# Patient Record
Sex: Female | Born: 1945 | Race: White | Hispanic: No | Smoking: Former smoker
Health system: Southern US, Community
[De-identification: ages and names within clinical notes are randomized; demographics above are authoritative.]

## PROBLEM LIST (undated history)

## (undated) DIAGNOSIS — C50919 Malignant neoplasm of unspecified site of unspecified female breast: Secondary | ICD-10-CM

## (undated) DIAGNOSIS — M1990 Unspecified osteoarthritis, unspecified site: Secondary | ICD-10-CM

## (undated) DIAGNOSIS — Z923 Personal history of irradiation: Secondary | ICD-10-CM

## (undated) DIAGNOSIS — I10 Essential (primary) hypertension: Secondary | ICD-10-CM

## (undated) DIAGNOSIS — Z9221 Personal history of antineoplastic chemotherapy: Secondary | ICD-10-CM

## (undated) DIAGNOSIS — E039 Hypothyroidism, unspecified: Secondary | ICD-10-CM

## (undated) DIAGNOSIS — I4891 Unspecified atrial fibrillation: Secondary | ICD-10-CM

## (undated) DIAGNOSIS — R0600 Dyspnea, unspecified: Secondary | ICD-10-CM

## (undated) HISTORY — PX: TONSILLECTOMY: SUR1361

## (undated) HISTORY — DX: Dyspnea, unspecified: R06.00

---
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
## 2003-03-05 DIAGNOSIS — C50919 Malignant neoplasm of unspecified site of unspecified female breast: Secondary | ICD-10-CM

## 2003-03-05 HISTORY — DX: Malignant neoplasm of unspecified site of unspecified female breast: C50.919

## 2003-03-05 HISTORY — PX: BREAST LUMPECTOMY: SHX2

## 2003-08-29 ENCOUNTER — Encounter: Admission: RE | Admit: 2003-08-29 | Discharge: 2003-08-29 | Payer: Self-pay

## 2003-08-29 ENCOUNTER — Encounter (INDEPENDENT_AMBULATORY_CARE_PROVIDER_SITE_OTHER): Payer: Self-pay | Admitting: Specialist

## 2003-08-29 HISTORY — PX: BREAST BIOPSY: SHX20

## 2003-09-06 ENCOUNTER — Encounter (HOSPITAL_COMMUNITY): Admission: RE | Admit: 2003-09-06 | Discharge: 2003-12-05 | Payer: Self-pay | Admitting: General Surgery

## 2003-09-09 ENCOUNTER — Encounter: Admission: RE | Admit: 2003-09-09 | Discharge: 2003-09-09 | Payer: Self-pay | Admitting: General Surgery

## 2003-09-12 ENCOUNTER — Ambulatory Visit (HOSPITAL_COMMUNITY): Admission: RE | Admit: 2003-09-12 | Discharge: 2003-09-12 | Payer: Self-pay | Admitting: General Surgery

## 2003-09-23 ENCOUNTER — Other Ambulatory Visit: Admission: RE | Admit: 2003-09-23 | Discharge: 2003-09-23 | Payer: Self-pay | Admitting: Obstetrics and Gynecology

## 2003-09-26 ENCOUNTER — Ambulatory Visit (HOSPITAL_BASED_OUTPATIENT_CLINIC_OR_DEPARTMENT_OTHER): Admission: RE | Admit: 2003-09-26 | Discharge: 2003-09-26 | Payer: Self-pay | Admitting: General Surgery

## 2003-09-26 ENCOUNTER — Ambulatory Visit (HOSPITAL_COMMUNITY): Admission: RE | Admit: 2003-09-26 | Discharge: 2003-09-26 | Payer: Self-pay | Admitting: General Surgery

## 2003-10-03 DIAGNOSIS — Z9221 Personal history of antineoplastic chemotherapy: Secondary | ICD-10-CM

## 2003-10-03 HISTORY — DX: Personal history of antineoplastic chemotherapy: Z92.21

## 2003-10-06 ENCOUNTER — Encounter: Admission: RE | Admit: 2003-10-06 | Discharge: 2003-10-06 | Payer: Self-pay | Admitting: Internal Medicine

## 2003-11-15 ENCOUNTER — Encounter: Admission: RE | Admit: 2003-11-15 | Discharge: 2003-11-15 | Payer: Self-pay | Admitting: Oncology

## 2004-01-02 ENCOUNTER — Ambulatory Visit: Payer: Self-pay | Admitting: Oncology

## 2004-01-03 ENCOUNTER — Ambulatory Visit: Payer: Self-pay | Admitting: Oncology

## 2004-01-12 ENCOUNTER — Encounter: Admission: RE | Admit: 2004-01-12 | Discharge: 2004-01-12 | Payer: Self-pay | Admitting: Oncology

## 2004-02-01 ENCOUNTER — Ambulatory Visit: Admission: RE | Admit: 2004-02-01 | Discharge: 2004-05-11 | Payer: Self-pay | Admitting: Radiation Oncology

## 2004-02-15 ENCOUNTER — Ambulatory Visit (HOSPITAL_COMMUNITY): Admission: RE | Admit: 2004-02-15 | Discharge: 2004-02-16 | Payer: Self-pay | Admitting: General Surgery

## 2004-02-15 ENCOUNTER — Encounter (INDEPENDENT_AMBULATORY_CARE_PROVIDER_SITE_OTHER): Payer: Self-pay | Admitting: Specialist

## 2004-03-04 DIAGNOSIS — Z923 Personal history of irradiation: Secondary | ICD-10-CM

## 2004-03-04 HISTORY — DX: Personal history of irradiation: Z92.3

## 2004-04-02 ENCOUNTER — Ambulatory Visit: Payer: Self-pay | Admitting: Oncology

## 2004-06-14 ENCOUNTER — Ambulatory Visit: Payer: Self-pay | Admitting: Oncology

## 2004-07-05 ENCOUNTER — Ambulatory Visit: Admission: RE | Admit: 2004-07-05 | Discharge: 2004-07-05 | Payer: Self-pay | Admitting: Radiation Oncology

## 2004-07-19 ENCOUNTER — Ambulatory Visit: Admission: RE | Admit: 2004-07-19 | Discharge: 2004-07-19 | Payer: Self-pay | Admitting: Radiation Oncology

## 2004-08-15 ENCOUNTER — Encounter: Admission: RE | Admit: 2004-08-15 | Discharge: 2004-08-15 | Payer: Self-pay | Admitting: Oncology

## 2004-09-06 ENCOUNTER — Ambulatory Visit: Payer: Self-pay | Admitting: Oncology

## 2004-11-28 ENCOUNTER — Ambulatory Visit: Payer: Self-pay | Admitting: Oncology

## 2005-03-06 ENCOUNTER — Ambulatory Visit: Payer: Self-pay | Admitting: Oncology

## 2005-03-07 ENCOUNTER — Encounter: Admission: RE | Admit: 2005-03-07 | Discharge: 2005-03-07 | Payer: Self-pay | Admitting: Oncology

## 2005-03-08 ENCOUNTER — Ambulatory Visit: Payer: Self-pay | Admitting: Oncology

## 2005-06-03 ENCOUNTER — Ambulatory Visit: Payer: Self-pay | Admitting: Oncology

## 2005-07-08 LAB — CBC WITH DIFFERENTIAL/PLATELET
BASO%: 0.4 % (ref 0.0–2.0)
EOS%: 3.4 % (ref 0.0–7.0)
LYMPH%: 30.2 % (ref 14.0–48.0)
MCH: 34.2 pg — ABNORMAL HIGH (ref 26.0–34.0)
MCHC: 34.7 g/dL (ref 32.0–36.0)
MONO#: 0.3 10*3/uL (ref 0.1–0.9)
Platelets: 241 10*3/uL (ref 145–400)
RBC: 4.22 10*6/uL (ref 3.70–5.32)
WBC: 4.4 10*3/uL (ref 3.9–10.0)
lymph#: 1.3 10*3/uL (ref 0.9–3.3)

## 2005-07-08 LAB — CANCER ANTIGEN 27.29: CA 27.29: 47 U/mL — ABNORMAL HIGH (ref 0–39)

## 2005-07-08 LAB — COMPREHENSIVE METABOLIC PANEL
ALT: 26 U/L (ref 0–40)
Albumin: 4.6 g/dL (ref 3.5–5.2)
CO2: 31 mEq/L (ref 19–32)
Calcium: 9.7 mg/dL (ref 8.4–10.5)
Chloride: 99 mEq/L (ref 96–112)
Glucose, Bld: 89 mg/dL (ref 70–99)
Sodium: 140 mEq/L (ref 135–145)
Total Protein: 7.7 g/dL (ref 6.0–8.3)

## 2005-08-15 ENCOUNTER — Ambulatory Visit: Payer: Self-pay | Admitting: Oncology

## 2005-08-19 ENCOUNTER — Encounter: Admission: RE | Admit: 2005-08-19 | Discharge: 2005-08-19 | Payer: Self-pay | Admitting: Oncology

## 2005-08-19 LAB — COMPREHENSIVE METABOLIC PANEL
Albumin: 4.7 g/dL (ref 3.5–5.2)
CO2: 26 mEq/L (ref 19–32)
Glucose, Bld: 89 mg/dL (ref 70–99)
Potassium: 4 mEq/L (ref 3.5–5.3)
Sodium: 141 mEq/L (ref 135–145)
Total Protein: 7.1 g/dL (ref 6.0–8.3)

## 2005-08-19 LAB — CBC WITH DIFFERENTIAL/PLATELET
Eosinophils Absolute: 0.1 10*3/uL (ref 0.0–0.5)
MONO#: 0.4 10*3/uL (ref 0.1–0.9)
NEUT#: 3.1 10*3/uL (ref 1.5–6.5)
Platelets: 220 10*3/uL (ref 145–400)
RBC: 4.02 10*6/uL (ref 3.70–5.32)
RDW: 12.5 % (ref 11.3–14.5)
WBC: 4.9 10*3/uL (ref 3.9–10.0)
lymph#: 1.4 10*3/uL (ref 0.9–3.3)

## 2005-08-19 LAB — CANCER ANTIGEN 27.29: CA 27.29: 41 U/mL — ABNORMAL HIGH (ref 0–39)

## 2005-09-30 ENCOUNTER — Ambulatory Visit: Payer: Self-pay | Admitting: Oncology

## 2005-12-19 ENCOUNTER — Ambulatory Visit: Payer: Self-pay | Admitting: Oncology

## 2006-01-20 LAB — CBC WITH DIFFERENTIAL/PLATELET
Basophils Absolute: 0 10*3/uL (ref 0.0–0.1)
Eosinophils Absolute: 0.1 10*3/uL (ref 0.0–0.5)
HCT: 40.7 % (ref 34.8–46.6)
HGB: 14.1 g/dL (ref 11.6–15.9)
LYMPH%: 34.7 % (ref 14.0–48.0)
MCV: 98.7 fL (ref 81.0–101.0)
MONO#: 0.3 10*3/uL (ref 0.1–0.9)
MONO%: 6.7 % (ref 0.0–13.0)
NEUT#: 2.3 10*3/uL (ref 1.5–6.5)
NEUT%: 54.9 % (ref 39.6–76.8)
Platelets: 250 10*3/uL (ref 145–400)
RBC: 4.13 10*6/uL (ref 3.70–5.32)
WBC: 4.2 10*3/uL (ref 3.9–10.0)

## 2006-01-20 LAB — COMPREHENSIVE METABOLIC PANEL
BUN: 16 mg/dL (ref 6–23)
CO2: 30 mEq/L (ref 19–32)
Glucose, Bld: 95 mg/dL (ref 70–99)
Sodium: 140 mEq/L (ref 135–145)
Total Bilirubin: 0.4 mg/dL (ref 0.3–1.2)
Total Protein: 7.4 g/dL (ref 6.0–8.3)

## 2006-03-07 ENCOUNTER — Ambulatory Visit: Payer: Self-pay | Admitting: Oncology

## 2006-04-23 ENCOUNTER — Ambulatory Visit: Payer: Self-pay | Admitting: Oncology

## 2006-06-05 ENCOUNTER — Ambulatory Visit: Payer: Self-pay | Admitting: Oncology

## 2006-08-21 ENCOUNTER — Encounter: Admission: RE | Admit: 2006-08-21 | Discharge: 2006-08-21 | Payer: Self-pay | Admitting: Oncology

## 2006-08-22 ENCOUNTER — Ambulatory Visit: Payer: Self-pay | Admitting: Oncology

## 2006-09-16 LAB — CANCER ANTIGEN 27.29: CA 27.29: 36 U/mL (ref 0–39)

## 2006-09-16 LAB — COMPREHENSIVE METABOLIC PANEL
ALT: 19 U/L (ref 0–35)
AST: 23 U/L (ref 0–37)
Albumin: 4.4 g/dL (ref 3.5–5.2)
Alkaline Phosphatase: 61 U/L (ref 39–117)
BUN: 17 mg/dL (ref 6–23)
Potassium: 4.1 mEq/L (ref 3.5–5.3)
Sodium: 140 mEq/L (ref 135–145)

## 2006-09-16 LAB — CBC WITH DIFFERENTIAL/PLATELET
BASO%: 0.6 % (ref 0.0–2.0)
EOS%: 5.5 % (ref 0.0–7.0)
MCH: 34.6 pg — ABNORMAL HIGH (ref 26.0–34.0)
MCHC: 35.7 g/dL (ref 32.0–36.0)
NEUT%: 44.6 % (ref 39.6–76.8)
RBC: 4.03 10*6/uL (ref 3.70–5.32)
RDW: 12.7 % (ref 11.3–14.5)
WBC: 4.4 10*3/uL (ref 3.9–10.0)
lymph#: 1.9 10*3/uL (ref 0.9–3.3)

## 2006-09-22 ENCOUNTER — Ambulatory Visit: Payer: Self-pay | Admitting: Oncology

## 2007-01-02 ENCOUNTER — Ambulatory Visit: Payer: Self-pay | Admitting: Oncology

## 2007-04-16 ENCOUNTER — Ambulatory Visit: Payer: Self-pay | Admitting: Oncology

## 2007-04-20 LAB — CBC WITH DIFFERENTIAL/PLATELET
Basophils Absolute: 0.1 10*3/uL (ref 0.0–0.1)
EOS%: 3 % (ref 0.0–7.0)
Eosinophils Absolute: 0.1 10*3/uL (ref 0.0–0.5)
HGB: 13.4 g/dL (ref 11.6–15.9)
LYMPH%: 47 % (ref 14.0–48.0)
MCH: 34 pg (ref 26.0–34.0)
MCV: 96.3 fL (ref 81.0–101.0)
MONO%: 7.6 % (ref 0.0–13.0)
NEUT#: 2 10*3/uL (ref 1.5–6.5)
Platelets: 240 10*3/uL (ref 145–400)
RBC: 3.95 10*6/uL (ref 3.70–5.32)
RDW: 10.6 % — ABNORMAL LOW (ref 11.3–14.5)

## 2007-04-20 LAB — COMPREHENSIVE METABOLIC PANEL
AST: 23 U/L (ref 0–37)
Alkaline Phosphatase: 51 U/L (ref 39–117)
BUN: 19 mg/dL (ref 6–23)
Glucose, Bld: 94 mg/dL (ref 70–99)
Potassium: 4 mEq/L (ref 3.5–5.3)
Total Bilirubin: 0.4 mg/dL (ref 0.3–1.2)

## 2007-08-26 ENCOUNTER — Encounter: Admission: RE | Admit: 2007-08-26 | Discharge: 2007-08-26 | Payer: Self-pay | Admitting: Oncology

## 2007-10-16 ENCOUNTER — Ambulatory Visit: Payer: Self-pay | Admitting: Oncology

## 2007-10-20 LAB — CBC WITH DIFFERENTIAL/PLATELET
BASO%: 0.8 % (ref 0.0–2.0)
EOS%: 6.9 % (ref 0.0–7.0)
MCH: 34.4 pg — ABNORMAL HIGH (ref 26.0–34.0)
MCHC: 35 g/dL (ref 32.0–36.0)
MCV: 98.3 fL (ref 81.0–101.0)
MONO%: 6.3 % (ref 0.0–13.0)
RBC: 3.82 10*6/uL (ref 3.70–5.32)
RDW: 12.8 % (ref 11.3–14.5)
lymph#: 1.3 10*3/uL (ref 0.9–3.3)

## 2007-10-20 LAB — COMPREHENSIVE METABOLIC PANEL
ALT: 18 U/L (ref 0–35)
AST: 23 U/L (ref 0–37)
Albumin: 4 g/dL (ref 3.5–5.2)
Alkaline Phosphatase: 54 U/L (ref 39–117)
Calcium: 8.9 mg/dL (ref 8.4–10.5)
Chloride: 100 mEq/L (ref 96–112)
Potassium: 3.8 mEq/L (ref 3.5–5.3)

## 2007-11-10 ENCOUNTER — Ambulatory Visit (HOSPITAL_COMMUNITY): Admission: RE | Admit: 2007-11-10 | Discharge: 2007-11-10 | Payer: Self-pay | Admitting: Oncology

## 2007-12-24 ENCOUNTER — Ambulatory Visit: Payer: Self-pay | Admitting: Oncology

## 2008-02-23 ENCOUNTER — Ambulatory Visit: Payer: Self-pay | Admitting: Oncology

## 2008-04-15 ENCOUNTER — Ambulatory Visit: Payer: Self-pay | Admitting: Oncology

## 2008-04-19 LAB — COMPREHENSIVE METABOLIC PANEL
ALT: 20 U/L (ref 0–35)
AST: 23 U/L (ref 0–37)
Albumin: 4.4 g/dL (ref 3.5–5.2)
CO2: 28 mEq/L (ref 19–32)
Calcium: 9.3 mg/dL (ref 8.4–10.5)
Chloride: 100 mEq/L (ref 96–112)
Creatinine, Ser: 0.76 mg/dL (ref 0.40–1.20)
Potassium: 4.2 mEq/L (ref 3.5–5.3)
Total Protein: 7.1 g/dL (ref 6.0–8.3)

## 2008-04-19 LAB — CBC WITH DIFFERENTIAL/PLATELET
BASO%: 0.4 % (ref 0.0–2.0)
HCT: 39.5 % (ref 34.8–46.6)
HGB: 13.7 g/dL (ref 11.6–15.9)
MCHC: 34.7 g/dL (ref 32.0–36.0)
MONO#: 0.3 10*3/uL (ref 0.1–0.9)
NEUT%: 47.7 % (ref 39.6–76.8)
RDW: 12.7 % (ref 11.3–14.5)
WBC: 4.2 10*3/uL (ref 3.9–10.0)
lymph#: 1.8 10*3/uL (ref 0.9–3.3)

## 2008-04-19 LAB — CANCER ANTIGEN 27.29: CA 27.29: 38 U/mL (ref 0–39)

## 2008-05-02 LAB — RESEARCH LABS

## 2008-09-21 ENCOUNTER — Encounter: Admission: RE | Admit: 2008-09-21 | Discharge: 2008-09-21 | Payer: Self-pay | Admitting: Oncology

## 2008-09-28 ENCOUNTER — Ambulatory Visit: Payer: Self-pay | Admitting: Oncology

## 2008-10-05 LAB — CBC WITH DIFFERENTIAL/PLATELET
Basophils Absolute: 0 10*3/uL (ref 0.0–0.1)
EOS%: 2.6 % (ref 0.0–7.0)
HCT: 39.9 % (ref 34.8–46.6)
HGB: 13.7 g/dL (ref 11.6–15.9)
MCH: 33.6 pg (ref 25.1–34.0)
MCV: 97.8 fL (ref 79.5–101.0)
MONO%: 5.1 % (ref 0.0–14.0)
NEUT%: 56.3 % (ref 38.4–76.8)
Platelets: 211 10*3/uL (ref 145–400)

## 2008-10-05 LAB — COMPREHENSIVE METABOLIC PANEL
Albumin: 4.2 g/dL (ref 3.5–5.2)
Alkaline Phosphatase: 41 U/L (ref 39–117)
BUN: 20 mg/dL (ref 6–23)
Glucose, Bld: 94 mg/dL (ref 70–99)
Total Bilirubin: 0.4 mg/dL (ref 0.3–1.2)

## 2008-10-05 LAB — CANCER ANTIGEN 27.29: CA 27.29: 35 U/mL (ref 0–39)

## 2008-11-24 ENCOUNTER — Ambulatory Visit: Payer: Self-pay | Admitting: Oncology

## 2009-01-20 ENCOUNTER — Ambulatory Visit: Payer: Self-pay | Admitting: Oncology

## 2009-03-27 ENCOUNTER — Ambulatory Visit: Payer: Self-pay | Admitting: Oncology

## 2009-03-28 LAB — VITAMIN D 25 HYDROXY (VIT D DEFICIENCY, FRACTURES): Vit D, 25-Hydroxy: 52 ng/mL (ref 30–89)

## 2009-03-28 LAB — CANCER ANTIGEN 27.29: CA 27.29: 39 U/mL (ref 0–39)

## 2009-03-28 LAB — CBC WITH DIFFERENTIAL/PLATELET
Eosinophils Absolute: 0.2 10*3/uL (ref 0.0–0.5)
HGB: 13.9 g/dL (ref 11.6–15.9)
MONO#: 0.3 10*3/uL (ref 0.1–0.9)
NEUT#: 3.2 10*3/uL (ref 1.5–6.5)
RBC: 4.02 10*6/uL (ref 3.70–5.45)
RDW: 12.8 % (ref 11.2–14.5)
WBC: 5.4 10*3/uL (ref 3.9–10.3)
lymph#: 1.7 10*3/uL (ref 0.9–3.3)

## 2009-03-28 LAB — COMPREHENSIVE METABOLIC PANEL
Albumin: 4.3 g/dL (ref 3.5–5.2)
Alkaline Phosphatase: 49 U/L (ref 39–117)
CO2: 29 mEq/L (ref 19–32)
Calcium: 9.7 mg/dL (ref 8.4–10.5)
Chloride: 96 mEq/L (ref 96–112)
Glucose, Bld: 95 mg/dL (ref 70–99)
Potassium: 3.9 mEq/L (ref 3.5–5.3)
Sodium: 137 mEq/L (ref 135–145)
Total Protein: 7.2 g/dL (ref 6.0–8.3)

## 2009-04-20 ENCOUNTER — Ambulatory Visit (HOSPITAL_COMMUNITY): Admission: RE | Admit: 2009-04-20 | Discharge: 2009-04-20 | Payer: Self-pay | Admitting: Family Medicine

## 2009-05-05 ENCOUNTER — Ambulatory Visit: Payer: Self-pay | Admitting: Oncology

## 2009-06-20 ENCOUNTER — Ambulatory Visit: Payer: Self-pay | Admitting: Oncology

## 2009-07-28 ENCOUNTER — Ambulatory Visit: Payer: Self-pay | Admitting: Oncology

## 2009-09-12 ENCOUNTER — Ambulatory Visit: Payer: Self-pay | Admitting: Oncology

## 2009-10-24 ENCOUNTER — Ambulatory Visit: Payer: Self-pay | Admitting: Oncology

## 2009-11-29 ENCOUNTER — Encounter: Admission: RE | Admit: 2009-11-29 | Discharge: 2009-11-29 | Payer: Self-pay | Admitting: Oncology

## 2009-12-01 ENCOUNTER — Ambulatory Visit: Payer: Self-pay | Admitting: Oncology

## 2009-12-05 ENCOUNTER — Ambulatory Visit (HOSPITAL_COMMUNITY): Admission: RE | Admit: 2009-12-05 | Discharge: 2009-12-05 | Payer: Self-pay | Admitting: Oncology

## 2009-12-05 LAB — COMPREHENSIVE METABOLIC PANEL
ALT: 25 U/L (ref 0–35)
AST: 22 U/L (ref 0–37)
Chloride: 99 mEq/L (ref 96–112)
Creatinine, Ser: 0.67 mg/dL (ref 0.40–1.20)
Total Bilirubin: 0.7 mg/dL (ref 0.3–1.2)

## 2009-12-05 LAB — CBC WITH DIFFERENTIAL/PLATELET
BASO%: 0.4 % (ref 0.0–2.0)
EOS%: 4.8 % (ref 0.0–7.0)
HCT: 42.2 % (ref 34.8–46.6)
LYMPH%: 31.8 % (ref 14.0–49.7)
MCH: 33.4 pg (ref 25.1–34.0)
MCHC: 33.9 g/dL (ref 31.5–36.0)
NEUT%: 57.4 % (ref 38.4–76.8)
lymph#: 1.7 10*3/uL (ref 0.9–3.3)

## 2010-03-25 ENCOUNTER — Encounter: Payer: Self-pay | Admitting: General Surgery

## 2010-06-05 ENCOUNTER — Encounter: Payer: 59 | Admitting: Oncology

## 2010-07-20 NOTE — Consult Note (Signed)
[name], Madison Nunez                 ACCOUNT NO.:  [id number]   MEDICAL RECORD NO.:  [id number]          PATIENT TYPE:   LOCATION:                                 FACILITY:   PHYSICIAN:  Jonelle Sports. Sevier, M.D.      DATE OF BIRTH:   DATE OF CONSULTATION:  03/20/2005  DATE OF DISCHARGE:                                   CONSULTATION   This 65 year old white female was referred by Dr. Darnelle Catalan for evaluation of  a small lump on the plantar aspect of her left foot.   On inspection this lesion is freely movable and very clearly represents a  synovial cyst on the flexor tendon of the hallux valgus.   The patient has a history of malignancy and obviously any lump concerns her,  so all that was provided here today was reassurance that this is a benign  situation and, in that it is not currently either painful or in any way a  mechanical problem, no treatment is recommended. Should it further enlarge  and become in any way mechanically of significance or painful, excision  would be recommended and it would be recommended this be done in an  orthopedist's office rather than in our environment which is  bacteriologically compromised.   The patient will not be charged for this visit.           ______________________________  Jonelle Sports. Cheryll Cockayne, M.D.     RES/MEDQ  D:  03/20/2005  T:  03/20/2005  Job:  [id number]   cc:   Valentino Hue. Magrinat, M.D.  Fax: [phone]

## 2010-07-20 NOTE — Op Note (Signed)
NAMEYANNET, RINCON                 ACCOUNT NO.:  [id number]   MEDICAL RECORD NO.:  [id number]          PATIENT TYPE:  OIB   LOCATION:  5729                         FACILITY:  [facility]   PHYSICIAN:  Ollen Gross. Vernell Morgans, M.D. DATE OF BIRTH:  1945-11-01   DATE OF PROCEDURE:  02/15/2004  DATE OF DISCHARGE:  02/16/2004                                 OPERATIVE REPORT   PREOPERATIVE DIAGNOSIS:  Right breast cancer.   POSTOPERATIVE DIAGNOSIS:  Right breast cancer.   PROCEDURE:  Right breast lumpectomy and sentinel node biopsy with injection  of blue dye.   SURGEON:  Ollen Gross. Carolynne Edouard, M.D.   ANESTHESIA:  General endotracheal.   PROCEDURE:  After informed consent was obtained, the patient was brought to  the operating room and placed in the supine position on the operating room  table.  After adequate induction of general anesthesia, the patient's right  breast and chest and axilla were prepped with Betadine and draped in the  usual sterile manner.  Initially the Neoprobe device was used to measure the  radioactivity at the primary injection site, which was measured around  14,000 counts.  A hot spot was found in the right axilla and initially a  small transverse incision was made overlying the hot spot.  This incision  was carried down through the subcutaneous tissue sharply using the  electrocautery until the axilla was entered.  Blunt dissection was then  carried out with a hemostat in the axilla until a blue lymph node was  identified.  It also had increased radioactivity.  It was excised sharply  with the electrocautery in a circumferential manner until the node was  completely removed from the axilla.  Its ex vivo counts were 1200 counts,  and it was sent to pathology as sentinel node #1.  The axilla was then  examined again and another hot spot was identified.  Again blunt dissection  was carried out in this area until another blue node was identified.  It was  dissected sharply in a  circumferential manner using the Bovie electrocautery  until it was completely excised from the axilla.  Its ex vivo counts were  3600, and it was sent to pathology as sentinel node #2.  The axilla was then  examined again with the Neoprobe and no further hot spots were identified.  The wound was examined and found to be hemostatic.  The wound was then  closed with a deep layer of interrupted 3-0 Vicryl stitches and the skin was  closed with a running 4-0 Monocryl subcuticular stitch.  This area was then  covered with sterile lap pad.  The attention was then turned to the breast  itself.  The mass was palpable in the approximately 11 o'clock position.  A  curvilinear incision was made on the superior portion of the areola somewhat  overlying this mass.  This was done with a 15 blade knife.  The incision was  then carried down through the skin and subcutaneous tissue sharply with the  electrocautery.  Dissection was carried out superior and inferior around the  palpable mass.  Efforts were made to try to stay widely around the mass to  get a good margin of normal breast tissue.  This dissection was done sharply  through the breast tissue using the electrocautery in a circumferential  manner.  Once this was accomplished, the mass was excised from the patient  and sent to pathology.  It was oriented prior to sending it to pathology.  At this point in time, the touch preps of the sentinel nodes were read as  normal with no malignancy identified.  The cavity of the breast tissue was  then irrigated with copious amounts of saline and hemostasis was achieved  using the Bovie electrocautery.  The wound looked good and the skin incision  was then closed with a running 4-0 Monocryl subcuticular stitch.  Benzoin  and Steri-Strips and sterile dressings were applied.  Touch preps of the  margin of the  primary specimen were also read as negative and the patient tolerated the  procedure well.  At the end  of the case, all needle, sponge, and instrument  counts were correct.  The patient was then awakened and taken to the  recovery room in stable condition.      Renae Fickle   PST/MEDQ  D:  02/17/2004  T:  02/17/2004  Job:  [id number]

## 2010-08-06 ENCOUNTER — Encounter (HOSPITAL_BASED_OUTPATIENT_CLINIC_OR_DEPARTMENT_OTHER): Payer: 59 | Admitting: Oncology

## 2010-08-06 DIAGNOSIS — C50919 Malignant neoplasm of unspecified site of unspecified female breast: Secondary | ICD-10-CM

## 2010-08-06 DIAGNOSIS — Z452 Encounter for adjustment and management of vascular access device: Secondary | ICD-10-CM

## 2011-01-28 ENCOUNTER — Other Ambulatory Visit: Payer: Self-pay | Admitting: Oncology

## 2011-01-28 DIAGNOSIS — Z853 Personal history of malignant neoplasm of breast: Secondary | ICD-10-CM

## 2011-02-06 ENCOUNTER — Ambulatory Visit
Admission: RE | Admit: 2011-02-06 | Discharge: 2011-02-06 | Disposition: A | Discharge status: Home / Self Care | Payer: Medicare Other | Source: Ambulatory Visit | Attending: Oncology | Admitting: Oncology

## 2011-02-06 DIAGNOSIS — Z853 Personal history of malignant neoplasm of breast: Secondary | ICD-10-CM

## 2011-02-19 ENCOUNTER — Emergency Department (HOSPITAL_COMMUNITY): Payer: Medicare Other

## 2011-02-19 ENCOUNTER — Other Ambulatory Visit: Payer: Self-pay

## 2011-02-19 ENCOUNTER — Inpatient Hospital Stay (HOSPITAL_COMMUNITY)
Admission: AD | Admit: 2011-02-19 | Discharge: 2011-02-20 | DRG: 204 | Disposition: A | Discharge status: Home / Self Care | Payer: Medicare Other | Attending: Internal Medicine | Admitting: Internal Medicine

## 2011-02-19 DIAGNOSIS — E039 Hypothyroidism, unspecified: Secondary | ICD-10-CM | POA: Diagnosis present

## 2011-02-19 DIAGNOSIS — I10 Essential (primary) hypertension: Secondary | ICD-10-CM | POA: Diagnosis present

## 2011-02-19 DIAGNOSIS — R0989 Other specified symptoms and signs involving the circulatory and respiratory systems: Secondary | ICD-10-CM | POA: Insufficient documentation

## 2011-02-19 DIAGNOSIS — R0602 Shortness of breath: Principal | ICD-10-CM | POA: Diagnosis present

## 2011-02-19 DIAGNOSIS — Z853 Personal history of malignant neoplasm of breast: Secondary | ICD-10-CM

## 2011-02-19 DIAGNOSIS — R110 Nausea: Secondary | ICD-10-CM | POA: Diagnosis not present

## 2011-02-19 DIAGNOSIS — I4510 Unspecified right bundle-branch block: Secondary | ICD-10-CM | POA: Diagnosis present

## 2011-02-19 HISTORY — DX: Essential (primary) hypertension: I10

## 2011-02-19 HISTORY — DX: Malignant neoplasm of unspecified site of unspecified female breast: C50.919

## 2011-02-19 HISTORY — DX: Unspecified osteoarthritis, unspecified site: M19.90

## 2011-02-19 HISTORY — DX: Hypothyroidism, unspecified: E03.9

## 2011-02-19 LAB — URINALYSIS, ROUTINE W REFLEX MICROSCOPIC
Bilirubin Urine: NEGATIVE
Glucose, UA: NEGATIVE mg/dL
Nitrite: NEGATIVE
Specific Gravity, Urine: 1.008 (ref 1.005–1.030)
pH: 5 (ref 5.0–8.0)

## 2011-02-19 LAB — DIFFERENTIAL
Basophils Absolute: 0 10*3/uL (ref 0.0–0.1)
Lymphocytes Relative: 25 % (ref 12–46)
Lymphs Abs: 1.7 10*3/uL (ref 0.7–4.0)
Monocytes Absolute: 0.3 10*3/uL (ref 0.1–1.0)
Neutro Abs: 4.5 10*3/uL (ref 1.7–7.7)

## 2011-02-19 LAB — CBC
HCT: 40.9 % (ref 36.0–46.0)
MCH: 33.5 pg (ref 26.0–34.0)
MCV: 94.4 fL (ref 78.0–100.0)
Platelets: 242 10*3/uL (ref 150–400)
RBC: 4.34 MIL/uL (ref 3.87–5.11)
RDW: 12 % (ref 11.5–15.5)
RDW: 12.1 % (ref 11.5–15.5)
WBC: 6.5 10*3/uL (ref 4.0–10.5)

## 2011-02-19 LAB — POCT I-STAT, CHEM 8
BUN: 16 mg/dL (ref 6–23)
Calcium, Ion: 1.18 mmol/L (ref 1.12–1.32)
Creatinine, Ser: 0.6 mg/dL (ref 0.50–1.10)
TCO2: 28 mmol/L (ref 0–100)

## 2011-02-19 LAB — CARDIAC PANEL(CRET KIN+CKTOT+MB+TROPI)
CK, MB: 5.9 ng/mL — ABNORMAL HIGH (ref 0.3–4.0)
Relative Index: 4.2 — ABNORMAL HIGH (ref 0.0–2.5)
Troponin I: <0.3 ng/mL (ref <0.30)

## 2011-02-19 MED ORDER — SODIUM CHLORIDE 0.9 % IJ SOLN
3.0000 mL | Freq: Two times a day (BID) | INTRAMUSCULAR | Status: DC
  Administered 2011-02-19 – 2011-02-20 (×2): 3 mL via INTRAVENOUS

## 2011-02-19 MED ORDER — HYDROCODONE-ACETAMINOPHEN 5-325 MG PO TABS: 1.0000 | ORAL_TABLET | ORAL | Status: DC | PRN

## 2011-02-19 MED ORDER — CALCIUM CARBONATE 600 MG PO TABS
600.0000 mg | ORAL_TABLET | Freq: Every day | ORAL | Status: DC
  Administered 2011-02-20: 500 mg via ORAL
  Filled 2011-02-19: qty 1

## 2011-02-19 MED ORDER — LEVOTHYROXINE SODIUM 50 MCG PO TABS
50.0000 ug | ORAL_TABLET | Freq: Every day | ORAL | Status: DC
  Administered 2011-02-20: 50 ug via ORAL
  Filled 2011-02-19 (×2): qty 1

## 2011-02-19 MED ORDER — ASPIRIN EC 81 MG PO TBEC
81.0000 mg | DELAYED_RELEASE_TABLET | Freq: Two times a day (BID) | ORAL | Status: DC
  Administered 2011-02-19 – 2011-02-20 (×3): 81 mg via ORAL
  Filled 2011-02-19 (×3): qty 1

## 2011-02-19 MED ORDER — SODIUM CHLORIDE 0.9 % IV SOLN: 250.0000 mL | INTRAVENOUS | Status: DC | PRN

## 2011-02-19 MED ORDER — ONDANSETRON HCL 4 MG PO TABS: 4.0000 mg | ORAL_TABLET | Freq: Four times a day (QID) | ORAL | Status: DC | PRN

## 2011-02-19 MED ORDER — ALUM & MAG HYDROXIDE-SIMETH 200-200-20 MG/5ML PO SUSP: 30.0000 mL | Freq: Four times a day (QID) | ORAL | Status: DC | PRN

## 2011-02-19 MED ORDER — OMEGA-3 FATTY ACIDS 1000 MG PO CAPS
1.0000 g | ORAL_CAPSULE | Freq: Every day | ORAL | Status: DC
  Administered 2011-02-20: 1 g via ORAL
  Filled 2011-02-19: qty 1

## 2011-02-19 MED ORDER — ASPIRIN EC 81 MG PO TBEC: 81.0000 mg | DELAYED_RELEASE_TABLET | Freq: Every day | ORAL | Status: DC

## 2011-02-19 MED ORDER — ACETAMINOPHEN 325 MG PO TABS: 650.0000 mg | ORAL_TABLET | Freq: Four times a day (QID) | ORAL | Status: DC | PRN

## 2011-02-19 MED ORDER — LISINOPRIL 20 MG PO TABS
20.0000 mg | ORAL_TABLET | Freq: Every day | ORAL | Status: DC
  Administered 2011-02-19 – 2011-02-20 (×2): 20 mg via ORAL
  Filled 2011-02-19 (×3): qty 1

## 2011-02-19 MED ORDER — VITAMIN D3 25 MCG (1000 UNIT) PO TABS
1000.0000 [IU] | ORAL_TABLET | Freq: Every day | ORAL | Status: DC
  Administered 2011-02-20: 1000 [IU] via ORAL
  Filled 2011-02-19: qty 1

## 2011-02-19 MED ORDER — ENOXAPARIN SODIUM 40 MG/0.4ML ~~LOC~~ SOLN
40.0000 mg | SUBCUTANEOUS | Status: DC
  Administered 2011-02-19: 40 mg via SUBCUTANEOUS
  Filled 2011-02-19 (×2): qty 0.4

## 2011-02-19 MED ORDER — ZOLPIDEM TARTRATE 5 MG PO TABS: 5.0000 mg | ORAL_TABLET | Freq: Every evening | ORAL | Status: DC | PRN

## 2011-02-19 MED ORDER — ONDANSETRON HCL 4 MG/2ML IJ SOLN: 4.0000 mg | Freq: Four times a day (QID) | INTRAMUSCULAR | Status: DC | PRN

## 2011-02-19 MED ORDER — THERA M PLUS PO TABS
1.0000 | ORAL_TABLET | Freq: Every day | ORAL | Status: DC
  Administered 2011-02-20: 1 via ORAL
  Filled 2011-02-19: qty 1

## 2011-02-19 MED ORDER — ACETAMINOPHEN 650 MG RE SUPP: 650.0000 mg | Freq: Four times a day (QID) | RECTAL | Status: DC | PRN

## 2011-02-19 MED ORDER — SODIUM CHLORIDE 0.9 % IJ SOLN: 3.0000 mL | INTRAMUSCULAR | Status: DC | PRN

## 2011-02-19 MED ORDER — PNEUMOCOCCAL VAC POLYVALENT 25 MCG/0.5ML IJ INJ
0.5000 mL | INJECTION | INTRAMUSCULAR | Status: AC
  Administered 2011-02-20: 0.5 mL via INTRAMUSCULAR
  Filled 2011-02-19: qty 0.5

## 2011-02-19 MED ORDER — HYDROCHLOROTHIAZIDE 25 MG PO TABS
12.5000 mg | ORAL_TABLET | Freq: Every day | ORAL | Status: DC
  Administered 2011-02-19: 12.5 mg via ORAL
  Filled 2011-02-19 (×4): qty 0.5

## 2011-02-19 NOTE — ED Provider Notes (Signed)
2:03 PM  I performed a history and physical examination of Madison Nunez and discussed her management with Fayrene Helper PA.  I agree with the history, physical, assessment, and plan of care, with the following exceptions: None  I've seen and evaluated this patient face-to-face. The patient is awake, alert, and oriented appropriately and in no apparent distress and states that present she is asymptomatic. Her blood pressure is controlled as is her heart rate. Her lungs are clear to auscultation in all fields and her heart sounds are normal with regular rate and rhythm.  I was present for the following procedures: None Time Spent in Critical Care of the patient: None Time spent in discussions with the patient and family: 5 minutes  Madison Nunez D    Felisa Bonier, MD 02/19/11 1404

## 2011-02-19 NOTE — ED Notes (Signed)
transprted to xray

## 2011-02-19 NOTE — ED Provider Notes (Signed)
History    this is a 65 year old female with a history of breast cancer is presenting to the ED with chief complaints of shortness of breath. Patient states she woke up this morning with sensation of shortness of breath. She felt as if she can't catch her breath. She subsequently developed lightheadedness, nausea, and weakness. She denies headache, sore throat, cough, neck pain, chest pain, abdominal pain, dysuria. These symptoms has improved while waiting in the ED. She has a history of hypertension but no other significant cardiac history. She does have a family history of heart disease. She denies any recent surgery, prolonged bed rest, a long trip. She denies lower extremity pain no swelling.  CSN: [id number] Arrival date & time: 02/19/2011  9:43 AM   First MD Initiated Contact with Patient 02/19/11 1231      Chief Complaint  Patient presents with  . Shortness of Breath  . Near Syncope    (Consider location/radiation/quality/duration/timing/severity/associated sxs/prior treatment) HPI  Past Medical History  Diagnosis Date  . Hypertension   . Thyroid disease     Past Surgical History  Procedure Date  . Breast lumpectomy     right    History reviewed. No pertinent family history.  History  Substance Use Topics  . Smoking status: Never Smoker   . Smokeless tobacco: Not on file  . Alcohol Use: Yes    OB History    Grav Para Term Preterm Abortions TAB SAB Ect Mult Living                  Review of Systems  All other systems reviewed and are negative.    Allergies  Other  Home Medications   Current Outpatient Rx  Name Route Sig Dispense Refill  . ASPIRIN EC 81 MG PO TBEC Oral Take 81 mg by mouth 2 times daily at 12 noon and 4 pm.      . CALCIUM CARBONATE 600 MG PO TABS Oral Take 600 mg by mouth daily.      Marland Kitchen VITAMIN D 1000 UNITS PO TABS Oral Take 1,000 Units by mouth daily.      . OMEGA-3 FATTY ACIDS 1000 MG PO CAPS Oral Take 1 g by mouth daily.      Marland Kitchen  GARLIC PO Oral Take 1 tablet by mouth daily.      Marland Kitchen LEVOTHYROXINE SODIUM 50 MCG PO TABS Oral Take 50 mcg by mouth daily.      Marland Kitchen LISINOPRIL-HYDROCHLOROTHIAZIDE 20-12.5 MG PO TABS Oral Take 1 tablet by mouth 2 (two) times daily.      Lanetta Inch BI-FLEX JOINT SHIELD PO TABS Oral Take 1 tablet by mouth daily.      Carma Leaven M PLUS PO TABS Oral Take 1 tablet by mouth daily.        BP 160/87  Pulse 96  Temp(Src) 98.3 F (36.8 C) (Oral)  Resp 20  Ht 5\' 4"  (1.626 m)  Wt 156 lb (70.761 kg)  BMI 26.78 kg/m2  SpO2 100%  LMP 02/19/2011  Physical Exam  Nursing note and vitals reviewed. Constitutional: She is oriented to person, place, and time.       Awake, alert, nontoxic appearance  HENT:  Head: Atraumatic.  Eyes: Right eye exhibits no discharge. Left eye exhibits no discharge.  Neck: Neck supple.  Cardiovascular: Normal rate and regular rhythm.   Pulmonary/Chest: Effort normal and breath sounds normal. No respiratory distress. She has no wheezes. She has no rales. She exhibits no tenderness.  Abdominal:  There is no tenderness. There is no rebound.  Musculoskeletal: Normal range of motion. She exhibits no edema and no tenderness.       Baseline ROM, no obvious new focal weakness  Neurological: She is alert and oriented to person, place, and time.       Mental status and motor strength appears baseline for patient and situation  Skin: No rash noted.  Psychiatric: She has a normal mood and affect.    ED Course  Procedures (including critical care time)  Labs Reviewed - No data to display No results found.   No diagnosis found.   Date: 02/19/2011  Rate: 99  Rhythm: sinus tachycardia  QRS Axis: left  Intervals: normal  ST/T Wave abnormalities: normal  Conduction Disutrbances:right bundle branch block  Narrative Interpretation:   Old EKG Reviewed: changes noted    MDM  Patient presents with acute onset of shortness of breath. Although the symptoms has improved, she does have a  history of breast cancer which increase risk of PE. She has an elevated pulse. Her EKG shows a new incomplete right bundle branch block. At this time, a d-dimer will order. I will also check an H&H to rule out anemia, check a UA to an infection, and chest x-rays rule out pneumonia. Patient is currently in no acute distress, and her vital signs stable. I discussed with my attending who agrees with assessment and plan.    2:34 PM Patient has a negative d-dimer. However, patient does have a new EKG changes, and an elevated CK-MB level. Even though she has no acute symptoms at this time, I'll call for admission for further management.  3:05 PM [facility] agrees to admit pt in a tele bed, team 5.  Pt currently in NAD, VSS.  Fayrene Helper, PA 02/19/11 [phone]

## 2011-02-19 NOTE — ED Notes (Signed)
Pt. Woke up this am with weakness, nausea , sob and near syncope.  These symptoms came in waves. Presently pt. Feels weak, sob and nauseated.  Pt. Denies any chest pain

## 2011-02-19 NOTE — ED Notes (Signed)
PA at bedside.

## 2011-02-19 NOTE — H&P (Signed)
PATIENT DETAILS Name: Madison Nunez Age: 65 y.o. Sex: female Date of Birth: 1945/03/07 Admit Date: 02/19/2011 PCP:No primary provider on file.   CHIEF COMPLAINT:  Shortness of breath Nausea  HPI: Patient is a 65 year old female with a past medical history significant for hypertension and a strong family history for coronary artery disease presenting to the emergency department because of shortness of breath. The patient was doing well until around 5 AM this morning when she woke up to take her Synthroid pill and felt short breath. She stayed in bed til 6AM she began feeling a pressure-like sensation in her head and chest. She stated that the pressure felt like to push inside of her. Subsequently she checked her blood pressure and it was 164/92. She took her lisinopril as usual. Skipped her dose of aspirin. The patient felt very nauseous without vomiting. No abdominal pain. Had a regular bowel movement. No hematochezia or melena. No fever, chills or diaphoresis. She drove herself to the emergency department as she can feeling very nauseous. In the ED she underwent a workup and she was found with a new right bundle branch block and elevated CK-MB with normal troponin. Normal d-dimer. Normal chest x-ray. Subsequently the patient was admitted for further workup and management   ALLERGIES:   Allergies  Allergen Reactions  . Other Hives    Steroid used prior to chemo     PAST MEDICAL HISTORY: Past Medical History  Diagnosis Date  . Hypertension   . Thyroid disease     PAST SURGICAL HISTORY: Past Surgical History  Procedure Date  . Breast lumpectomy     right  Appendectomy  MEDICATIONS AT HOME: Prior to Admission medications   Medication Sig Start Date End Date Taking? Authorizing Provider  aspirin EC 81 MG tablet Take 81 mg by mouth 2 times daily at 12 noon and 4 pm.     Yes Historical Provider, MD  calcium carbonate (OS-CAL) 600 MG TABS Take 600 mg by mouth daily.     Yes  Historical Provider, MD  cholecalciferol (VITAMIN D) 1000 UNITS tablet Take 1,000 Units by mouth daily.     Yes Historical Provider, MD  fish oil-omega-3 fatty acids 1000 MG capsule Take 1 g by mouth daily.     Yes Historical Provider, MD  GARLIC PO Take 1 tablet by mouth daily.     Yes Historical Provider, MD  levothyroxine (SYNTHROID, LEVOTHROID) 50 MCG tablet Take 50 mcg by mouth daily.     Yes Historical Provider, MD  lisinopril-hydrochlorothiazide (PRINZIDE,ZESTORETIC) 20-12.5 MG per tablet Take 1 tablet by mouth 2 (two) times daily.     Yes Historical Provider, MD  Misc Natural Products (OSTEO BI-FLEX JOINT SHIELD) TABS Take 1 tablet by mouth daily.     Yes Historical Provider, MD  Multiple Vitamins-Minerals (MULTIVITAMINS THER. W/MINERALS) TABS Take 1 tablet by mouth daily.     Yes Historical Provider, MD    FAMILY HISTORY: The patient has a brother who had coronary artery disease diagnosed at the age of 13. Her father had diabetes as well as coronary artery disease in his 65s. Her paternal grandmother had a CVA and grandfather hypertension. Her mother had Alzheimer's dementia and abdominal mass. 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 She drinks one glass of wine per night. No more than 2 she says. Denies tobacco. Lives by herself.   REVIEW OF SYSTEMS:  Constitutional:   No  weight  loss, night sweats,  Fevers, chills, fatigue.  HEENT:    No headaches, Difficulty swallowing,Tooth/dental problems,Sore throat,  No sneezing, itching, ear ache, nasal congestion, post nasal drip,   Cardio-vascular: No chest pain,  Orthopnea, PND, swelling in lower extremities, anasarca,         dizziness, palpitations  GI:  No heartburn, indigestion, abdominal pain, vomiting, diarrhea, change in       bowel habits, loss of appetite  Resp: Shortness of breath.  No excess mucus, no productive cough, No non-productive cough,   No coughing up of blood.No change in color of mucus.No wheezing.No chest wall deformity  Skin:  no rash or lesions.  GU:  no dysuria, change in color of urine, no urgency or frequency.  No flank pain.  Musculoskeletal: No joint pain or swelling.  No decreased range of motion.  No back pain.    PHYSICAL EXAM: Blood pressure 149/89, pulse 94, temperature 98.3 F (36.8 C), temperature source Oral, resp. rate 13, height 5\' 4"  (1.626 m), weight 156 lb (70.761 kg), last menstrual period 02/19/2011, SpO2 100.00%.  General appearance :Awake, alert, not in any distress. Speech Clear. Not toxic Looking HEENT: Atraumatic and Normocephalic, pupils equally reactive to light and accomodation Neck: supple, no JVD. No cervical lymphadenopathy.  Chest:Good air entry bilaterally, no added sounds  CVS: S1 S2 regular, no murmurs.  Abdomen: Bowel sounds present, Non tender and not distended with no gaurding, rigidity or rebound. Extremities: B/L Lower Ext shows no edema, both legs are warm to touch, with  dorsalis pedis pulses palpable. Neurology: Awake alert, and oriented X 3, CN II-XII intact, Non focal, Deep Tendon Reflex-2+ all over, plantar's downgoing B/L, sensory exam is grossly intact.  Skin:No Rash Wounds:N/A  LABS ON ADMISSION:   Basename 02/19/11 1319  NA 136  K 3.5  CL 100  CO2 --  GLUCOSE 100*  BUN 16  CREATININE 0.60  CALCIUM --  MG --  PHOS --   No results found for this basename: AST:2,ALT:2,ALKPHOS:2,BILITOT:2,PROT:2,ALBUMIN:2 in the last 72 hours No results found for this basename: LIPASE:2,AMYLASE:2 in the last 72 hours  Basename 02/19/11 1319 02/19/11 1246  WBC -- 6.5  NEUTROABS -- 4.5  HGB 15.6* 14.7  HCT 46.0 40.9  MCV -- 94.2  PLT -- 234    Basename 02/19/11 1246  CKTOTAL 141  CKMB 5.9*  CKMBINDEX --  TROPONINI <0.30    Basename 02/19/11 1246  DDIMER 0.30   No components found with this basename: POCBNP:3   RADIOLOGIC STUDIES ON ADMISSION: Dg  Chest 2 View  02/19/2011  *RADIOLOGY REPORT*  Clinical Data: Shortness of breath.  CHEST - 2 VIEW  Comparison: Chest CT dated 12/05/2009 and chest x-ray dated 02/13/2004  Findings: Port-A-Cath is unchanged.  Heart size and vascularity are normal and the lungs are clear.  No significant osseous abnormality.  IMPRESSION: No acute disease.  Original Report Authenticated By: Gwynn Burly, M.D.   Mm Digital Diagnostic Bilat  02/06/2011  *RADIOLOGY REPORT*  Clinical Data:  Right lumpectomy, 2006.  Asymptomatic.  DIGITAL DIAGNOSTIC BILATERAL MAMMOGRAM WITH CAD  Comparison:  Multiple priors  Findings:  There is a fibroglandular pattern.  The patient has had a right lumpectomy.  No new mass or malignant type calcifications are seen.  Compared to priors. Mammographic images were processed with CAD.  IMPRESSION: No mammographic evidence of local recurrence, right breast.  No mammographic evidence of malignancy, left breast.  Recommend diagnostic mammography in 1 year.  BI-RADS CATEGORY 1:  Negative.  Original Report Authenticated By: Hiram Gash, M.D.    ASSESSMENT AND PLAN: Present on Admission:  .Shortness of breath Nausea without vomiting Elevated CK-MB with normal troponin New right bundle branch block History of hypertension History of hypothyroidism  Admit patient to telemetry. Serial cardiac enzymes every 8 hrs x3 2-D echocardiogram in a.m. Repeat EKG in a.m.   Further plan will depend as patient's clinical course evolves and further radiologic and laboratory data become available. Patient will be monitored closely.   DVT Prophylaxis: Lovenox  Code Status: Full code  Total time spent for admission equals 45 minutes.  [place]a, Gathers 02/19/2011, 5:02 PM

## 2011-02-20 ENCOUNTER — Other Ambulatory Visit: Payer: Self-pay

## 2011-02-20 ENCOUNTER — Encounter (HOSPITAL_COMMUNITY): Payer: Self-pay | Admitting: Physician Assistant

## 2011-02-20 DIAGNOSIS — R0600 Dyspnea, unspecified: Secondary | ICD-10-CM

## 2011-02-20 DIAGNOSIS — R0602 Shortness of breath: Principal | ICD-10-CM

## 2011-02-20 LAB — CARDIAC PANEL(CRET KIN+CKTOT+MB+TROPI)
CK, MB: 4.5 ng/mL — ABNORMAL HIGH (ref 0.3–4.0)
Relative Index: 3.7 — ABNORMAL HIGH (ref 0.0–2.5)
Relative Index: 3.9 — ABNORMAL HIGH (ref 0.0–2.5)
Total CK: 115 U/L (ref 7–177)
Total CK: 112 U/L (ref 7–177)
Total CK: 122 U/L (ref 7–177)
Troponin I: <0.3 ng/mL (ref <0.30)
Troponin I: <0.3 ng/mL (ref <0.30)

## 2011-02-20 MED ORDER — HYDROCHLOROTHIAZIDE 12.5 MG PO CAPS
12.5000 mg | ORAL_CAPSULE | Freq: Every day | ORAL | Status: DC
  Administered 2011-02-20: 12.5 mg via ORAL
  Filled 2011-02-20: qty 1

## 2011-02-20 NOTE — Discharge Summary (Signed)
PATIENT DETAILS Name: Madison Nunez Age: 65 y.o. Sex: female Date of Birth: 03-Nov-1945 MRN: [id number]. Admit Date: 02/19/2011 Admitting Physician: Kela Millin PCP:No primary provider on file.  PRIMARY DISCHARGE DIAGNOSIS:  Principal Problem:  *Shortness of breath Active Problems:  Nausea alone      PAST MEDICAL HISTORY: Past Medical History  Diagnosis Date  . Hypertension   . Heart murmur   . Mitral valve prolapse     dx'd 1970's  . Hypothyroidism   . Breast cancer 2005    right lumpectomy, chemotherapy with A/C combo  & Taxol, mammogram 02/06/11 without recurrence  . Arthritis     hands and lower back    DISCHARGE MEDICATIONS: Current Discharge Medication List    CONTINUE these medications which have NOT CHANGED   Details  aspirin EC 81 MG tablet Take 81 mg by mouth 2 times daily at 12 noon and 4 pm.      calcium carbonate (OS-CAL) 600 MG TABS Take 600 mg by mouth daily.      cholecalciferol (VITAMIN D) 1000 UNITS tablet Take 1,000 Units by mouth daily.      fish oil-omega-3 fatty acids 1000 MG capsule Take 1 g by mouth daily.      GARLIC PO Take 1 tablet by mouth daily.      levothyroxine (SYNTHROID, LEVOTHROID) 50 MCG tablet Take 50 mcg by mouth daily.      lisinopril-hydrochlorothiazide (PRINZIDE,ZESTORETIC) 20-12.5 MG per tablet Take 1 tablet by mouth 2 (two) times daily.      Misc Natural Products (OSTEO BI-FLEX JOINT SHIELD) TABS Take 1 tablet by mouth daily.      Multiple Vitamins-Minerals (MULTIVITAMINS THER. W/MINERALS) TABS Take 1 tablet by mouth daily.           BRIEF HPI:  See H&P, Labs, Consult and Test reports for all details in brief, patient was admitted for shortness of breath and nausea. She felt that her blood pressure was high. Further details please see history and physical that was dictated on admission.  CONSULTATIONS:   cardiology  PERTINENT RADIOLOGIC STUDIES: Dg Chest 2 View  02/19/2011  *RADIOLOGY REPORT*  Clinical  Data: Shortness of breath.  CHEST - 2 VIEW  Comparison: Chest CT dated 12/05/2009 and chest x-ray dated 02/13/2004  Findings: Port-A-Cath is unchanged.  Heart size and vascularity are normal and the lungs are clear.  No significant osseous abnormality.  IMPRESSION: No acute disease.  Original Report Authenticated By: Gwynn Burly, M.D.   Mm Digital Diagnostic Bilat  02/06/2011  *RADIOLOGY REPORT*  Clinical Data:  Right lumpectomy, 2006.  Asymptomatic.  DIGITAL DIAGNOSTIC BILATERAL MAMMOGRAM WITH CAD  Comparison:  Multiple priors  Findings:  There is a fibroglandular pattern.  The patient has had a right lumpectomy.  No new mass or malignant type calcifications are seen.  Compared to priors. Mammographic images were processed with CAD.  IMPRESSION: No mammographic evidence of local recurrence, right breast.  No mammographic evidence of malignancy, left breast.  Recommend diagnostic mammography in 1 year.  BI-RADS CATEGORY 1:  Negative.  Original Report Authenticated By: Hiram Gash, M.D.     PERTINENT LAB RESULTS: CBC:  Basename 02/19/11 1844 02/19/11 1319 02/19/11 1246  WBC 6.0 -- 6.5  HGB 14.3 15.6* --  HCT 40.3 46.0 --  PLT 242 -- 234   CMET CMP     Component Value Date/Time   NA 136 02/19/2011 1319   K 3.5 02/19/2011 1319   CL 100 02/19/2011 1319  CO2 31 12/05/2009 0807   GLUCOSE 100* 02/19/2011 1319   BUN 16 02/19/2011 1319   CREATININE 0.64 02/19/2011 1844   CALCIUM 9.4 12/05/2009 0807   PROT 7.8 12/05/2009 0807   ALBUMIN 4.1 12/05/2009 0807   AST 22 12/05/2009 0807   ALT 25 12/05/2009 0807   ALKPHOS 71 12/05/2009 0807   BILITOT 0.7 12/05/2009 0807   GFRNONAA >90 02/19/2011 1844   GFRAA >90 02/19/2011 1844    GFR Estimated Creatinine Clearance: 67.6 ml/min (by C-G formula based on Cr of 0.64). No results found for this basename: LIPASE:2,AMYLASE:2 in the last 72 hours  Basename 02/20/11 0905 02/19/11 2329 02/19/11 1246  CKTOTAL 112 115 141  CKMB 4.1* 4.5* 5.9*    CKMBINDEX -- -- --  TROPONINI <0.30 <0.30 <0.30   No components found with this basename: POCBNP:3  Basename 02/19/11 1246  DDIMER 0.30   No results found for this basename: HGBA1C:2 in the last 72 hours No results found for this basename: CHOL:2,HDL:2,LDLCALC:2,TRIG:2,CHOLHDL:2,LDLDIRECT:2 in the last 72 hours  Basename 02/19/11 1844  TSH 1.888  T4TOTAL --  T3FREE --  THYROIDAB --   No results found for this basename: VITAMINB12:2,FOLATE:2,FERRITIN:2,TIBC:2,IRON:2,RETICCTPCT:2 in the last 72 hours Coags: No results found for this basename: PT:2,INR:2 in the last 72 hours Microbiology: No results found for this or any previous visit (from the past 240 hour(s)).   BRIEF HOSPITAL COURSE:   Principal Problem:  *Shortness of breath -This is resolved. Cardiac enzymes were negative. D-dimer was within normal limits her and given her low probability of having pulmonary embolism no further testing has been pursued. She has been evaluated by [facility] cardiology, a 2-D echocardiogram has been done which shows a normal systolic function and no wall motion abnormality. She has been scheduled for an outpatient stress test an outpatient followup with [facility]ology.  Rest of her medical problems were stable.    TODAY-DAY OF DISCHARGE:  Subjective:   Madison Nunez today has no headache,no chest abdominal pain,no new weakness tingling or numbness, feels much better wants to go home today.   Objective:   Blood pressure 147/88, pulse 105, temperature 97.8 F (36.6 C), temperature source Oral, resp. rate 20, height 5\' 4"  (1.626 m), weight 70.761 kg (156 lb), last menstrual period 02/19/2011, SpO2 99.00%.  Intake/Output Summary (Last 24 hours) at 02/20/11 1612 Last data filed at 02/20/11 0800  Gross per 24 hour  Intake    360 ml  Output      0 ml  Net    360 ml    Exam Awake Alert, Oriented *3, No new F.N deficits, Normal affect [place].AT,PERRAL Supple Neck,No JVD, No cervical  lymphadenopathy appriciated.  Symmetrical Chest wall movement, Good air movement bilaterally, CTAB RRR,No Gallops,Rubs or new Murmurs, No Parasternal Heave +ve B.Sounds, Abd Soft, Non tender, No organomegaly appriciated, No rebound -guarding or rigidity. No Cyanosis, Clubbing or edema, No new Rash or bruise  DISPOSITION: Home  DISCHARGE INSTRUCTIONS:    Follow-up Information    Follow up with [facility]. (Your stress test is scheduled for 02/25/11 at 8:15 a.m. Nothing to eat or drink after midnight the night before. Wear comfortable shoes. If you need to reschedule, please call our office)    Contact information:   [address] [place] [address]       Follow up with Tereso Newcomer, PA. (At [facility] for your follow-up appointment 03/07/11 at 3pm)    Contact information:   [address]  [address] [place] [address] [phone]       Follow up with [facility]. Make an appointment in 1 week.   Contact information:   [address], [address] [place] [address] [phone]          Total Time spent on discharge equals 45 minutes.  SignedJeoffrey Massed 02/20/2011 4:12 PM

## 2011-02-20 NOTE — Progress Notes (Signed)
  Echocardiogram 2D Echocardiogram has been performed.  Cathie Beams Deneen 02/20/2011, 12:59 PM

## 2011-02-20 NOTE — Progress Notes (Signed)
PATIENT DETAILS Name: Madison Nunez Age: 65 y.o. Sex: female Date of Birth: 1946/01/10 Admit Date: 02/19/2011 PCP:No primary provider on file.  Subjective: Feels good. Does not have shortness of breath currently  Objective: Vital signs in last 24 hours: Filed Vitals:   02/19/11 1700 02/19/11 1828 02/19/11 2141 02/20/11 0600  BP: 118/67 128/79 108/67 110/66  Pulse: 107 101 80 74  Temp:  98.3 F (36.8 C) 98.9 F (37.2 C) 97.8 F (36.6 C)  TempSrc:  Oral Oral Oral  Resp: 18 18 18 18   Height:      Weight:      SpO2: 97% 96% 98% 95%    Weight change:   Body mass index is 26.78 kg/(m^2).  Intake/Output from previous day:  Intake/Output Summary (Last 24 hours) at 02/20/11 1444 Last data filed at 02/20/11 0800  Gross per 24 hour  Intake    360 ml  Output      0 ml  Net    360 ml    PHYSICAL EXAM: Gen Exam: Awake and alert with clear speech.   Neck: Supple, No JVD.   Chest: B/L Clear.   CVS: S1 S2 Regular, no murmurs.  Abdomen: soft, BS +, non tender, non distended.  Extremities: no edema, lower extremities warm to touch. Neurologic: Non Focal.   Skin: No Rash.   Wounds: N/A.    CONSULTS:  cardiology  LAB RESULTS: CBC  Lab 02/19/11 1844 02/19/11 1319 02/19/11 1246  WBC 6.0 -- 6.5  HGB 14.3 15.6* 14.7  HCT 40.3 46.0 40.9  PLT 242 -- 234  MCV 94.4 -- 94.2  MCH 33.5 -- 33.9  MCHC 35.5 -- 35.9  RDW 12.1 -- 12.0  LYMPHSABS -- -- 1.7  MONOABS -- -- 0.3  EOSABS -- -- 0.0  BASOSABS -- -- 0.0  BANDABS -- -- --    Chemistries   Lab 02/19/11 1844 02/19/11 1319  NA -- 136  K -- 3.5  CL -- 100  CO2 -- --  GLUCOSE -- 100*  BUN -- 16  CREATININE 0.64 0.60  CALCIUM -- --  MG -- --    GFR Estimated Creatinine Clearance: 67.6 ml/min (by C-G formula based on Cr of 0.64).  Coagulation profile No results found for this basename: INR:5,PROTIME:5 in the last 168 hours  Cardiac Enzymes  Lab 02/20/11 0905 02/19/11 2329 02/19/11 1246  CKMB 4.1* 4.5* 5.9*   TROPONINI <0.30 <0.30 <0.30  MYOGLOBIN -- -- --    No components found with this basename: POCBNP:3  Basename 02/19/11 1246  DDIMER 0.30   No results found for this basename: HGBA1C:2 in the last 72 hours No results found for this basename: CHOL:2,HDL:2,LDLCALC:2,TRIG:2,CHOLHDL:2,LDLDIRECT:2 in the last 72 hours  Basename 02/19/11 1844  TSH 1.888  T4TOTAL --  T3FREE --  THYROIDAB --   No results found for this basename: VITAMINB12:2,FOLATE:2,FERRITIN:2,TIBC:2,IRON:2,RETICCTPCT:2 in the last 72 hours No results found for this basename: LIPASE:2,AMYLASE:2 in the last 72 hours  Urine Studies No results found for this basename: UACOL:2,UAPR:2,USPG:2,UPH:2,UTP:2,UGL:2,UKET:2,UBIL:2,UHGB:2,UNIT:2,UROB:2,ULEU:2,UEPI:2,UWBC:2,URBC:2,UBAC:2,CAST:2,CRYS:2,UCOM:2,BILUA:2 in the last 72 hours  MICROBIOLOGY: No results found for this or any previous visit (from the past 240 hour(s)).  RADIOLOGY STUDIES/RESULTS: Dg Chest 2 View  02/19/2011  *RADIOLOGY REPORT*  Clinical Data: Shortness of breath.  CHEST - 2 VIEW  Comparison: Chest CT dated 12/05/2009 and chest x-ray dated 02/13/2004  Findings: Port-A-Cath is unchanged.  Heart size and vascularity are normal and the lungs are clear.  No significant osseous abnormality.  IMPRESSION: No acute disease.  Original Report Authenticated By: Gwynn Burly, M.D.   Mm Digital Diagnostic Bilat  02/06/2011  *RADIOLOGY REPORT*  Clinical Data:  Right lumpectomy, 2006.  Asymptomatic.  DIGITAL DIAGNOSTIC BILATERAL MAMMOGRAM WITH CAD  Comparison:  Multiple priors  Findings:  There is a fibroglandular pattern.  The patient has had a right lumpectomy.  No new mass or malignant type calcifications are seen.  Compared to priors. Mammographic images were processed with CAD.  IMPRESSION: No mammographic evidence of local recurrence, right breast.  No mammographic evidence of malignancy, left breast.  Recommend diagnostic mammography in 1 year.  BI-RADS CATEGORY 1:   Negative.  Original Report Authenticated By: Hiram Gash, M.D.    MEDICATIONS: Scheduled Meds:   . aspirin EC  81 mg Oral q12n4p  . calcium carbonate  600 mg Oral Daily  . cholecalciferol  1,000 Units Oral Daily  . enoxaparin  40 mg Subcutaneous Q24H  . fish oil-omega-3 fatty acids  1 g Oral Daily  . hydrochlorothiazide  12.5 mg Oral Daily  . levothyroxine  50 mcg Oral QAC breakfast  . lisinopril  20 mg Oral Daily  . multivitamins ther. w/minerals  1 tablet Oral Daily  . pneumococcal 23 valent vaccine  0.5 mL Intramuscular Tomorrow-1000  . sodium chloride  3 mL Intravenous Q12H  . DISCONTD: aspirin EC  81 mg Oral Daily  . DISCONTD: hydrochlorothiazide  12.5 mg Oral Daily   Continuous Infusions:  PRN Meds:.sodium chloride, acetaminophen, acetaminophen, alum & mag hydroxide-simeth, HYDROcodone-acetaminophen, ondansetron (ZOFRAN) IV, ondansetron, sodium chloride, zolpidem  Antibiotics: Anti-infectives    None      Assessment/Plan: Patient Active Hospital Problem List: Shortness of breath    Assessment: Currently off her symptoms have resolved. Her cardiac enzymes have been negative so far. D-dimer is negative as well, given the low pretest probability-we will not pursue with further studies. Her chest x-ray did not show anything acute. She does have a significant family history of CAD    Plan: Appreciate cardiology eval, await 2-D echocardiogram, if no significant wall motion abnormalities will discharge home later today, patient will followup with cardiology for an outpatient stress test.    hypertension    Assessment: controlled    Plan: continue with lisinopril and hydrochlorothiazide.  Hypothyroidism -Continue with levothyroxine.  Disposition:  discharge home-if echocardiogram normal.   DVT Prophylaxis:  Lovenox.   Code Status:  full code.   Maretta Bees, MD. 02/20/2011, 2:44 PM

## 2011-02-20 NOTE — Consult Note (Addendum)
CARDIOLOGY CONSULT NOTE  Patient ID: Madison Nunez, MRN: [id number], DOB/AGE: 1946/02/21 65 y.o. Admit date: 02/19/2011 Date of Consult: 02/20/2011  Primary Physician: Dr. Maryelizabeth Rowan Primary Cardiologist: New  Chief Complaint: SOB & "a wave of pressure came over me" Reason for Consultation: SOB  HPI: 65 y/o F with hx breast CA s/p chemo & lumpectomy, hypothyroidism, HTN, & family hx CAD but no self-hx of CAD presented to [facility] with complaints of SOB. Yesterday AM while standing in the kitchen, she suddenly felt like she could not get a good breath in and felt a "wave of pressure" come over her from her toes up to her head. She felt immediately nauseated, but no vomiting. No CP, palpitations, visual changes but she felt like she was going to pass out so went to her bedroom to lie down which helped slightly. BP was 164/92 HR 89 which is unusual for her. She took her lisinopril but the "wave" sensation persisted so she proceeded to ER. She thinks her SOB has been made worse with exertion since it started. No orthopnea, PND, syncope, or significant LEE. No exertional CP or SOB recently - she is very active with gardening/yardwork. No immobilization, trauma, or recent surgeries. She was in usual state of health before yesterday.  She notes about 3 months ago she had a solid week of "fibrillations"/heart palpitations but did not seek care at that time. She has not felt any since, and did not have any with this episode. In ER, was found to have an incomplete RBBB and HR's 80s-100s. D-dimer was negative. Troponin neg x 2 but MB elevated up to 5.9. She is very concerned for her cardiac status given her family hx.  Past Medical History  Diagnosis Date  . Hypertension   . Heart murmur   . Mitral valve prolapse     dx'd 1970's  . Hypothyroidism   . Breast cancer 2005    right lumpectomy, chemotherapy with A/C combo  & Taxol, mammogram 02/06/11 without recurrence  . Arthritis     hands and lower back       Surgical History:  Past Surgical History  Procedure Date  . Appendectomy 1955  . Breast lumpectomy 2005    right  . Tonsillectomy     "as a child"     Home Meds: Medication Sig  aspirin EC 81 MG tablet Take 81 mg by mouth 2 times daily at 12 noon and 4 pm.    calcium carbonate (OS-CAL) 600 MG TABS Take 600 mg by mouth daily.    cholecalciferol (VITAMIN D) 1000 UNITS tablet Take 1,000 Units by mouth daily.    fish oil-omega-3 fatty acids 1000 MG capsule Take 1 g by mouth daily.    GARLIC PO Take 1 tablet by mouth daily.    levothyroxine (SYNTHROID, LEVOTHROID) 50 MCG tablet Take 50 mcg by mouth daily.    lisinopril-hydrochlorothiazide (PRINZIDE,ZESTORETIC) 20-12.5 MG per tablet Take 1 tablet by mouth 2 (two) times daily.    Misc Natural Products (OSTEO BI-FLEX JOINT SHIELD) TABS Take 1 tablet by mouth daily.    Multiple Vitamins-Minerals (MULTIVITAMINS THER. W/MINERALS) TABS Take 1 tablet by mouth daily.      Inpatient Medications:     . aspirin EC  81 mg Oral q12n4p  . calcium carbonate  600 mg Oral Daily  . cholecalciferol  1,000 Units Oral Daily  . enoxaparin  40 mg Subcutaneous Q24H  . fish oil-omega-3 fatty acids  1 g Oral Daily  .  hydrochlorothiazide  12.5 mg Oral Daily  . levothyroxine  50 mcg Oral QAC breakfast  . lisinopril  20 mg Oral Daily  . multivitamins ther. w/minerals  1 tablet Oral Daily  . pneumococcal 23 valent vaccine  0.5 mL Intramuscular Tomorrow-1000  . sodium chloride  3 mL Intravenous Q12H  . DISCONTD: aspirin EC  81 mg Oral Daily    Allergies:  Allergies  Allergen Reactions  . Other Hives    Steroid used prior to chemo     History   Social History  . Marital Status: Divorced    Spouse Name: N/A    Number of Children: N/A  . Years of Education: N/A   Occupational History  . Not on file.   Social History Main Topics  . Smoking status: Never Smoker   . Smokeless tobacco: Never Used  . Alcohol Use: 5.4 oz/week    9 Glasses of  wine per week  . Drug Use: No  . Sexually Active: No   Other Topics Concern  . Not on file   Social History Narrative  . No narrative on file     Family History  Problem Relation Age of Onset  . Coronary artery disease Brother 24    died of first MI at 54  . Coronary artery disease Father     had juvenile DM most of his life, had 3 MI's the first in his early 51's, died at 93  . Diabetes Father      Review of Systems: General: negative for chills, fever, night sweats or weight changes.  Cardiovascular: negative for chest pain, edema, orthopnea, paroxysmal nocturnal dyspnea. + sob as above Dermatological: negative for rash Respiratory: negative for cough or wheezing Urologic: negative for hematuria Abdominal: negative for nausea, vomiting, diarrhea, bright red blood per rectum, melena, or hematemesis Neurologic: negative for visual changes, syncope All other systems reviewed and are otherwise negative except as noted above.  Labs:  [facility] 02/19/11 2329 02/19/11 1246  CKTOTAL 115 141  CKMB 4.5* 5.9*  TROPONINI <0.30 <0.30   Lab Results  Component Value Date   WBC 6.0 02/19/2011   HGB 14.3 02/19/2011   HCT 40.3 02/19/2011   MCV 94.4 02/19/2011   PLT 242 02/19/2011     Lab 02/19/11 1844 02/19/11 1319  NA -- 136  K -- 3.5  CL -- 100  CO2 -- --  BUN -- 16  CREATININE 0.64 --  CALCIUM -- --  PROT -- --  BILITOT -- --  ALKPHOS -- --  ALT -- --  AST -- --  GLUCOSE -- 100*    Lab Results  Component Value Date   DDIMER 0.30 02/19/2011    Radiology/Studies:  1. Chest 2 View 02/19/2011  *RADIOLOGY REPORT*  Clinical Data: Shortness of breath.  CHEST - 2 VIEW  Comparison: Chest CT dated 12/05/2009 and chest x-ray dated 02/13/2004  Findings: Port-A-Cath is unchanged.  Heart size and vascularity are normal and the lungs are clear.  No significant osseous abnormality.  IMPRESSION: No acute disease.  Original Report Authenticated By: Gwynn Burly, M.D.   EKG:  NSR ICRBBB RSR V1 otherwise no acute change. No prior EKGs are available. Telemetry shows NSR occasional sinus arrhythmia  Physical Exam: Blood pressure 110/66, pulse 74, temperature 97.8 F (36.6 C), temperature source Oral, resp. rate 18, height 5\' 4"  (1.626 m), weight 156 lb (70.761 kg), last menstrual period 02/19/2011, SpO2 95.00%. General: Well developed, well nourished, in no acute distress, appears younger  than stated age. Head: Normocephalic, atraumatic, sclera non-icteric, no xanthomas, nares are without discharge.  Neck: Negative for carotid bruits. JVD not elevated. Lungs: Clear bilaterally to auscultation without wheezes, rales, or rhonchi. Breathing is unlabored. Heart: RRR with S1 S2. No murmurs, rubs, or gallops appreciated. Abdomen: Soft, non-tender, non-distended with normoactive bowel sounds. No hepatomegaly. No rebound/guarding. No obvious abdominal masses. Msk:  Strength and tone appear normal for age. Extremities: No clubbing or cyanosis. No edema.  Distal pedal pulses are 2+ and equal bilaterally. Neuro: Alert and oriented X 3. Moves all extremities spontaneously. Psych:  Responds to questions appropriately with a normal affect.     Assessment and Plan:   1. SOB/presyncope - unclear etiology. She denies any chest pain whatsoever, but endorses nausea/presyncope/pressure sensation in whole body with yesterday's event. Symptoms are atypical for ACS and troponins have been negative x 2, although MBs are slightly elevated (negative relative index). She is very concerned this is "her time" for heart problems given her significant family hx. I don't think ischemic workup is unreasonable given fhx & HTN. D-dimer was negative but there is a small subset of high pre-test probability patients that still have PE despite negative d-dimers, so might consider proceeding with CTA for definitive evaluation given SOB, ICRBBB (RSR V1), and vague symptoms. Agree with 2D echo given hx chemo.  Check lipid panel. Will discuss with MD.  2. Remote palpitations, 3 months ago - none recently. If recurs, would recommend event monitor to r/o arrhythmia.  3. HTN - well controlled, no changes.  Signed, Ronie Spies PA-C 02/20/2011, 11:40 AM   Patient seen and examined with Ronie Spies PA-C. We discussed all aspects of the encounter. I agree with the assessment and plan as stated above. Dyspnea atypical pattern - doubt angina. ECG/CE and d-dimer normal. Agree with echo. If normal can d/c home today. proceed with outpatient stress testing. We discussed the risks and indications of cath vs stress and she prefers stress.   Daniel Bensimhon,MD 12:11 PM

## 2011-02-20 NOTE — Progress Notes (Signed)
Utilization review complete 

## 2011-02-20 NOTE — Progress Notes (Signed)
Echo results are normal. Patient will f/u for stress nuc 02/25/11 at 8:15am and with Tereso Newcomer PA-C on 03/07/11 at 3pm at [facility]. I put these appts in her f/u section in Discharge Navigator. Primary team paged and made aware. I also spoke with the patient regarding the results. She verbalized understanding.  Dayna Dunn PA-C 02/20/2011 3:41 PM

## 2011-02-22 NOTE — ED Provider Notes (Signed)
Evaluation and management procedures were performed by the PA/NP under my supervision/collaboration.  I evaluated this patient face-to-face at the time of encounter.  Please see my note dated at that time.  Felisa Bonier, MD 02/22/11 [phone]

## 2011-02-25 ENCOUNTER — Ambulatory Visit (HOSPITAL_COMMUNITY): Payer: Medicare Other | Attending: Cardiology | Admitting: Radiology

## 2011-02-25 VITALS — BP 147/87 | Ht 65.0 in | Wt 152.0 lb

## 2011-02-25 DIAGNOSIS — I10 Essential (primary) hypertension: Secondary | ICD-10-CM | POA: Insufficient documentation

## 2011-02-25 DIAGNOSIS — R0600 Dyspnea, unspecified: Secondary | ICD-10-CM

## 2011-02-25 DIAGNOSIS — R0602 Shortness of breath: Secondary | ICD-10-CM

## 2011-02-25 DIAGNOSIS — R0989 Other specified symptoms and signs involving the circulatory and respiratory systems: Secondary | ICD-10-CM | POA: Insufficient documentation

## 2011-02-25 DIAGNOSIS — R0609 Other forms of dyspnea: Secondary | ICD-10-CM | POA: Insufficient documentation

## 2011-02-25 DIAGNOSIS — Z8249 Family history of ischemic heart disease and other diseases of the circulatory system: Secondary | ICD-10-CM | POA: Insufficient documentation

## 2011-02-25 DIAGNOSIS — R55 Syncope and collapse: Secondary | ICD-10-CM

## 2011-02-25 MED ORDER — TECHNETIUM TC 99M TETROFOSMIN IV KIT
30.0000 | PACK | Freq: Once | INTRAVENOUS | Status: AC | PRN
  Administered 2011-02-25: 30 via INTRAVENOUS

## 2011-02-25 MED ORDER — TECHNETIUM TC 99M TETROFOSMIN IV KIT
10.0000 | PACK | Freq: Once | INTRAVENOUS | Status: AC | PRN
  Administered 2011-02-25: 10 via INTRAVENOUS

## 2011-02-25 NOTE — Progress Notes (Signed)
[facility] SITE 3 NUCLEAR MED [address] [place] [address] [phone]  Cardiology Nuclear Med Madison Nunez is a 65 y.o. female [id number] [birth year]   Nuclear Med Background Indication for Stress Test:  Evaluation for Ischemia and Post Hospital- 02/19/11(ER) for dyspnea- negative enzymes. History:  Echo-02/20/11(EF= 60-65%) Cardiac Risk Factors: Family History - CAD and Hypertension  Symptoms:  DOE and Near Syncope   Nuclear Pre-Procedure Caffeine/Decaff Intake:  7:30pm NPO After: 7:30pm   Lungs: clear IV 0.9% NS with Angio Cath:  22g  IV Site: L Forearm  IV Started by:  Cathlyn Parsons, RN  Chest Size (in):  36 Cup Size: C  Height: 5\' 5"  (1.651 m)  Weight:  152 lb (68.947 kg)  BMI:  Body mass index is 25.29 kg/(m^2). Tech Comments:  n/a    Nuclear Med Study 1 or 2 day study: 1 day  Stress Test Type:  Stress  Reading MD: Cassell Clement, MD  Order Authorizing Provider:  [email or web address]  Resting Radionuclide: Technetium 29m Tetrofosmin  Resting Radionuclide Dose: 11.0 mCi   Stress Radionuclide:  Technetium 22m Tetrofosmin  Stress Radionuclide Dose: 33.0 mCi           Stress Protocol Rest HR: 63 Stress HR: 79  Rest BP: 112/70 Stress BP: 116/79  Exercise Time (min): 5:00 METS: 4.90   Predicted Max HR: 155 bpm % Max HR: 90.97 bpm Rate Pressure Product: 21308   Dose of Adenosine (mg):  n/a Dose of Lexiscan: n/a mg  Dose of Atropine (mg): n/a Dose of Dobutamine: n/a mcg/kg/min (at max HR)  Stress Test Technologist: Milana Na, EMT-P  Nuclear Technologist:  Harlow Asa, CNMT     Rest Procedure:  Myocardial perfusion imaging was performed at rest 45 minutes following the intravenous administration of Technetium 36m Tetrofosmin. Rest ECG: NSR-RBBB  Stress Procedure:  The patient exercised for 5:00.  The patient stopped due to fatigue and denied any chest pain.  There were no significant ST-T wave changes.   Technetium 25m Tetrofosmin was injected at peak exercise and myocardial perfusion imaging was performed after a brief delay. Stress ECG: No significant change from baseline ECG  QPS Raw Data Images:  Normal; no motion artifact; normal heart/lung ratio. Stress Images:  Normal homogeneous uptake in all areas of the myocardium. Rest Images:  Normal homogeneous uptake in all areas of the myocardium. Subtraction (SDS):  No evidence of ischemia. Transient Ischemic Dilatation (Normal <1.22):  0.86 Lung/Heart Ratio (Normal <0.45):  0.38  Quantitative Gated Spect Images QGS EDV:  45 ml QGS ESV:  9 ml QGS cine images:  NL LV Function; NL Wall Motion QGS EF: 80%  Impression Exercise Capacity:  Good exercise capacity. BP Response:  Normal blood pressure response. Clinical Symptoms:  No chest pain. ECG Impression:  No significant ST segment change suggestive of ischemia. Comparison with Prior Nuclear Study: No images to compare  Overall Impression:  Normal stress nuclear study.  Normal EF    Cassell Clement MD

## 2011-02-27 ENCOUNTER — Telehealth: Payer: Self-pay | Admitting: *Deleted

## 2011-02-27 NOTE — Telephone Encounter (Signed)
patient requested flush appointment for 03-07-2011 at 8:30am

## 2011-03-06 ENCOUNTER — Encounter: Payer: Self-pay | Admitting: Physician Assistant

## 2011-03-07 ENCOUNTER — Encounter: Payer: Self-pay | Admitting: Physician Assistant

## 2011-03-07 ENCOUNTER — Ambulatory Visit (HOSPITAL_BASED_OUTPATIENT_CLINIC_OR_DEPARTMENT_OTHER): Payer: Medicare Other

## 2011-03-07 ENCOUNTER — Ambulatory Visit (INDEPENDENT_AMBULATORY_CARE_PROVIDER_SITE_OTHER): Payer: Medicare Other | Admitting: Physician Assistant

## 2011-03-07 DIAGNOSIS — Z452 Encounter for adjustment and management of vascular access device: Secondary | ICD-10-CM

## 2011-03-07 DIAGNOSIS — C50919 Malignant neoplasm of unspecified site of unspecified female breast: Secondary | ICD-10-CM

## 2011-03-07 DIAGNOSIS — E039 Hypothyroidism, unspecified: Secondary | ICD-10-CM

## 2011-03-07 DIAGNOSIS — R42 Dizziness and giddiness: Secondary | ICD-10-CM | POA: Insufficient documentation

## 2011-03-07 DIAGNOSIS — I10 Essential (primary) hypertension: Secondary | ICD-10-CM

## 2011-03-07 DIAGNOSIS — R110 Nausea: Secondary | ICD-10-CM

## 2011-03-07 MED ORDER — FAMOTIDINE 20 MG PO TABS: 20.0000 mg | ORAL_TABLET | Freq: Two times a day (BID) | ORAL | Status: DC

## 2011-03-07 MED ORDER — SODIUM CHLORIDE 0.9 % IJ SOLN
10.0000 mL | INTRAMUSCULAR | Status: DC | PRN
  Administered 2011-03-07: 10 mL via INTRAVENOUS
  Filled 2011-03-07: qty 10

## 2011-03-07 MED ORDER — HEPARIN SOD (PORK) LOCK FLUSH 100 UNIT/ML IV SOLN
500.0000 [IU] | Freq: Once | INTRAVENOUS | Status: AC
  Administered 2011-03-07: 500 [IU] via INTRAVENOUS
  Filled 2011-03-07: qty 5

## 2011-03-07 NOTE — Assessment & Plan Note (Addendum)
I will place her on Pepcid 20 mg twice a day.  Have asked her to followup with his PCP for further evaluation and management.  She may need referral to gastroenterology depending upon her response to therapy.  If she has no improvement, she can likely stop the Pepcid.

## 2011-03-07 NOTE — Progress Notes (Signed)
[address] [place], [place]  [address] Phone: [phone] Fax:  [phone]  Date:  03/07/2011   Name:  Madison Nunez       DOB:  [birth year] MRN:  [id number]  PCP:  Dr. Duanne Guess Primary Cardiologist:  Dr. Arvilla Meres  Primary Electrophysiologist:  None    History of Present Illness: Madison Nunez is a 66 y.o. female who presents for post hospital follow up.  She has a history of breast cancer, status post chemotherapy and lumpectomy, hypothyroidism, hypertension and family history of CAD.  She was admitted 12/18-12/19 with complaints of dyspnea and near syncope.  She ruled out for myocardial infarction.  Echocardiogram 02/20/11: EF 60-65%.  D-dimer was normal.  She was set up for outpatient ETT-Myoview.  This was done 12/24 and was normal with an EF of 80%.  She returns for followup.  She is doing well.  Denies chest pain, DOE, orthopnea, PND or edema.  She has had one other episode like what brought her to the ED.  She felt it shortly after drinking coffee.  Notes a flushed feeling with associated nausea then lightheadedness.  No syncope.    Past Medical History  Diagnosis Date  . Hypertension   . Mitral valve prolapse     dx'd 1970's  . Hypothyroidism   . Breast cancer 2005    right lumpectomy, chemotherapy with A/C combo  & Taxol, mammogram 02/06/11 without recurrence  . Arthritis     hands and lower back  . Dyspnea     Echocardiogram 02/20/11: EF 60-65%.;  ETT-myoveiw 12/12 normal with EF 80%    Current Outpatient Prescriptions  Medication Sig Dispense Refill  . aspirin EC 81 MG tablet Take 81 mg by mouth 3 (three) times daily.       . cholecalciferol (VITAMIN D) 1000 UNITS tablet Take 1,000 Units by mouth 3 (three) times daily.       . fish oil-omega-3 fatty acids 1000 MG capsule Take 1 g by mouth daily.        . Flaxseed, Linseed, (FLAX SEEDS PO) Take by mouth.        Marland Kitchen GARLIC PO Take 1 tablet by mouth daily.        Marland Kitchen levothyroxine (SYNTHROID,  LEVOTHROID) 50 MCG tablet Take 50 mcg by mouth daily.        Marland Kitchen lisinopril-hydrochlorothiazide (PRINZIDE,ZESTORETIC) 20-12.5 MG per tablet Take 1 tablet by mouth 2 (two) times daily.        . Misc Natural Products (OSTEO BI-FLEX JOINT SHIELD) TABS Take 2 tablets by mouth daily.       . Multiple Vitamins-Minerals (MULTIVITAMINS THER. W/MINERALS) TABS Take 1 tablet by mouth daily.        . calcium carbonate (OS-CAL) 600 MG TABS Take 600 mg by mouth daily.         No current facility-administered medications for this visit.   Facility-Administered Medications Ordered in Other Visits  Medication Dose Route Frequency Provider Last Rate Last Dose  . heparin lock flush 100 unit/mL  500 Units Intravenous Once Lowella Dell, MD   500 Units at 03/07/11 0844  . DISCONTD: sodium chloride 0.9 % injection 10 mL  10 mL Intravenous PRN Lowella Dell, MD   10 mL at 03/07/11 0843    Allergies: Allergies  Allergen Reactions  . Other Hives    Steroid used prior to chemo     History  Substance Use Topics  . Smoking status:  Never Smoker   . Smokeless tobacco: Never Used  . Alcohol Use: 5.4 oz/week    9 Glasses of wine per week     ROS:  Please see the history of present illness.   All other systems reviewed and negative.   PHYSICAL EXAM: VS:  BP 122/68  Pulse 86  Resp 17  Ht 5\' 4"  (1.626 m)  Wt 156 lb (70.761 kg)  BMI 26.78 kg/m2  LMP 02/19/2011 Well nourished, well developed, in no acute distress HEENT: normal Neck: no JVD Endocrine: no thyromegaly Cardiac:  normal S1, S2; RRR; no murmur Lungs:  clear to auscultation bilaterally, no wheezing, rhonchi or rales Abd: soft, nontender, no hepatomegaly Ext: no edema Skin: warm and dry Neuro:  CNs 2-12 intact, no focal abnormalities noted Psych: normal affect  EKG:   Sinus rhythm, heart rate 86, normal axis, incomplete right bundle branch block, nonspecific ST-T wave changes  ASSESSMENT AND PLAN:

## 2011-03-07 NOTE — Assessment & Plan Note (Signed)
Controlled.  Continue current therapy.  

## 2011-03-07 NOTE — Assessment & Plan Note (Addendum)
Her symptoms sound vasovagal in nature.  Question whether or not she may have an atypical form of acid reflux disease contributing to her symptoms.  As noted, her echocardiogram and stress test were both normal.  I will have her wear an event monitor for 3 weeks.  She can followup with me in 2 month.  See discussion below.  If monitor is normal, no further cardiac workup.  Other etiologies for her symptoms can be pursued with her PCP in this case.

## 2011-03-07 NOTE — Patient Instructions (Signed)
Your physician has recommended you make the following change in your medication: START Pepcid 20mg  take one by mouth twice a day and follow-up with Primary Care provider  Your physician has recommended that you wear an event monitor. Event monitors are medical devices that record the heart's electrical activity. Doctors most often [place] these monitors to diagnose arrhythmias. Arrhythmias are problems with the speed or rhythm of the heartbeat. The monitor is a small, portable device. You can wear one while you do your normal daily activities. This is usually used to diagnose what is causing palpitations/syncope (passing out).  Your physician recommends that you schedule a follow-up appointment in: 2 MONTHS with Tereso Newcomer PA-C

## 2011-03-07 NOTE — Assessment & Plan Note (Signed)
Lab Results  Component Value Date   TSH 1.888 02/19/2011

## 2011-03-07 NOTE — Patient Instructions (Signed)
Call MD for problems 

## 2011-03-07 NOTE — Progress Notes (Signed)
No blood return from port when flushed.  This is not a new finding.

## 2011-03-08 ENCOUNTER — Encounter: Payer: Self-pay | Admitting: Physician Assistant

## 2011-05-09 ENCOUNTER — Ambulatory Visit: Payer: Medicare Other | Admitting: Physician Assistant

## 2011-05-09 ENCOUNTER — Other Ambulatory Visit: Payer: Self-pay | Admitting: Family Medicine

## 2011-05-09 DIAGNOSIS — Z780 Asymptomatic menopausal state: Secondary | ICD-10-CM

## 2011-05-23 ENCOUNTER — Ambulatory Visit
Admission: RE | Admit: 2011-05-23 | Discharge: 2011-05-23 | Disposition: A | Discharge status: Home / Self Care | Payer: Medicare Other | Source: Ambulatory Visit | Attending: Family Medicine | Admitting: Family Medicine

## 2011-05-23 DIAGNOSIS — Z780 Asymptomatic menopausal state: Secondary | ICD-10-CM

## 2011-10-18 ENCOUNTER — Ambulatory Visit (HOSPITAL_BASED_OUTPATIENT_CLINIC_OR_DEPARTMENT_OTHER): Payer: Medicare Other

## 2011-10-18 VITALS — BP 124/73 | HR 79 | Temp 98.8°F

## 2011-10-18 DIAGNOSIS — Z452 Encounter for adjustment and management of vascular access device: Secondary | ICD-10-CM

## 2011-10-18 DIAGNOSIS — C50919 Malignant neoplasm of unspecified site of unspecified female breast: Secondary | ICD-10-CM

## 2011-10-18 MED ORDER — SODIUM CHLORIDE 0.9 % IJ SOLN
10.0000 mL | INTRAMUSCULAR | Status: DC | PRN
  Administered 2011-10-18: 10 mL via INTRAVENOUS
  Filled 2011-10-18: qty 10

## 2011-10-18 MED ORDER — HEPARIN SOD (PORK) LOCK FLUSH 100 UNIT/ML IV SOLN
500.0000 [IU] | Freq: Once | INTRAVENOUS | Status: AC
  Administered 2011-10-18: 500 [IU] via INTRAVENOUS
  Filled 2011-10-18: qty 5

## 2011-10-18 NOTE — Progress Notes (Signed)
Left port flushed.  No blood return and this is not new for this port

## 2011-10-18 NOTE — Patient Instructions (Signed)
Call MD for problems 

## 2012-09-07 ENCOUNTER — Other Ambulatory Visit: Payer: Self-pay | Admitting: Oncology

## 2012-09-07 DIAGNOSIS — Z853 Personal history of malignant neoplasm of breast: Secondary | ICD-10-CM

## 2012-09-07 DIAGNOSIS — Z9889 Other specified postprocedural states: Secondary | ICD-10-CM

## 2012-09-22 ENCOUNTER — Ambulatory Visit
Admission: RE | Admit: 2012-09-22 | Discharge: 2012-09-22 | Disposition: A | Discharge status: Home / Self Care | Payer: Medicare Other | Source: Ambulatory Visit | Attending: Oncology | Admitting: Oncology

## 2012-09-22 DIAGNOSIS — Z9889 Other specified postprocedural states: Secondary | ICD-10-CM

## 2012-09-22 DIAGNOSIS — Z853 Personal history of malignant neoplasm of breast: Secondary | ICD-10-CM

## 2012-10-12 ENCOUNTER — Emergency Department (HOSPITAL_COMMUNITY)
Admission: EM | Admit: 2012-10-12 | Discharge: 2012-10-12 | Disposition: A | Discharge status: Home / Self Care | Payer: Medicare Other | Attending: Emergency Medicine | Admitting: Emergency Medicine

## 2012-10-12 ENCOUNTER — Encounter (HOSPITAL_COMMUNITY): Payer: Self-pay | Admitting: *Deleted

## 2012-10-12 DIAGNOSIS — Z79899 Other long term (current) drug therapy: Secondary | ICD-10-CM | POA: Insufficient documentation

## 2012-10-12 DIAGNOSIS — E039 Hypothyroidism, unspecified: Secondary | ICD-10-CM | POA: Insufficient documentation

## 2012-10-12 DIAGNOSIS — Z87891 Personal history of nicotine dependence: Secondary | ICD-10-CM | POA: Insufficient documentation

## 2012-10-12 DIAGNOSIS — F411 Generalized anxiety disorder: Secondary | ICD-10-CM | POA: Insufficient documentation

## 2012-10-12 DIAGNOSIS — Z853 Personal history of malignant neoplasm of breast: Secondary | ICD-10-CM | POA: Insufficient documentation

## 2012-10-12 DIAGNOSIS — J069 Acute upper respiratory infection, unspecified: Secondary | ICD-10-CM

## 2012-10-12 DIAGNOSIS — M129 Arthropathy, unspecified: Secondary | ICD-10-CM | POA: Insufficient documentation

## 2012-10-12 DIAGNOSIS — Z7982 Long term (current) use of aspirin: Secondary | ICD-10-CM | POA: Insufficient documentation

## 2012-10-12 MED ORDER — HYDROCODONE-ACETAMINOPHEN 7.5-325 MG/15ML PO SOLN
10.0000 mL | ORAL | Status: DC | PRN
Start: 2012-10-12 — End: 2013-10-02

## 2012-10-12 NOTE — ED Notes (Signed)
The pt became anxious  And started breathing fast heart rate increased her bp increased.  Advised to slow her breathing and try to relax..  C/o a dry mouth also

## 2012-10-12 NOTE — ED Notes (Signed)
The pt has had a cold since last Wednesday.  She was taking  Over-the -counter cold meds and her bp shot up.   She is here for  The high bp.  She took extra bp med before she came here.  No temp

## 2012-10-12 NOTE — ED Provider Notes (Signed)
CSN: [id number]     Arrival date & time 10/12/12  0320 History     First MD Initiated Contact with Patient 10/12/12 [phone]     Chief Complaint  Patient presents with  . Hypertension   (Consider location/radiation/quality/duration/timing/severity/associated sxs/prior Treatment) HPI History provided by patient - sick for the last 4 days with cough, cold, congestion, sore throat and low-grade fevers at home. She has a history of hypertension and has been taking over-the-counter medications. Tonight she checked her blood pressure found it was elevated, became worried and presents here for evaluation.   She took her morning dose of lisinopril prior to coming to the ER. No productive cough. No chest pain. No abdominal pain. No nausea vomiting. No known sick contacts. Symptoms are moderate in severity.   Past Medical History  Diagnosis Date  . Hypertension   . Mitral valve prolapse     dx'd 1970's  . Hypothyroidism   . Breast cancer 2005    right lumpectomy, chemotherapy with A/C combo  & Taxol, mammogram 02/06/11 without recurrence  . Arthritis     hands and lower back  . Dyspnea     Echocardiogram 02/20/11: EF 60-65%.;  ETT-myoveiw 12/12 normal with EF 80%   Past Surgical History  Procedure Laterality Date  . Appendectomy  1955  . Breast lumpectomy  2005    right  . Tonsillectomy      "as a child"   Family History  Problem Relation Age of Onset  . Coronary artery disease Brother 63    died of first MI at 63  . Coronary artery disease Father     had juvenile DM most of his life, had 3 MI's the first in his early 53's, died at 73  . Diabetes Father    History  Substance Use Topics  . Smoking status: Never Smoker   . Smokeless tobacco: Never Used  . Alcohol Use: 5.4 oz/week    9 Glasses of wine per week   OB History   Grav Para Term Preterm Abortions TAB SAB Ect Mult Living                 Review of Systems  Constitutional: Negative for chills and diaphoresis.  HENT:  Positive for congestion and sore throat. Negative for neck pain and neck stiffness.   Eyes: Negative for discharge.  Respiratory: Positive for cough. Negative for shortness of breath and wheezing.   Cardiovascular: Negative for chest pain.  Gastrointestinal: Negative for vomiting and abdominal pain.  Genitourinary: Negative for dysuria.  Musculoskeletal: Negative for back pain.  Skin: Negative for rash.  Neurological: Negative for weakness and headaches.  All other systems reviewed and are negative.    Allergies  Other  Home Medications   Current Outpatient Rx  Name  Route  Sig  Dispense  Refill  . aspirin EC 81 MG tablet   Oral   Take 162 mg by mouth daily.          . Calcium Carbonate-Vitamin D (CALTRATE 600+D PO)   Oral   Take 2 tablets by mouth daily.         Marland Kitchen dextromethorphan (DELSYM) 30 MG/5ML liquid   Oral   Take 60 mg by mouth 2 (two) times daily as needed for cough.         . Flaxseed, Linseed, (FLAX SEEDS PO)   Oral   Take 2 tablets by mouth daily.          Marland Kitchen GARLIC PO  Oral   Take 1 tablet by mouth daily.           Marland Kitchen levothyroxine (SYNTHROID, LEVOTHROID) 50 MCG tablet   Oral   Take 50 mcg by mouth daily.           Marland Kitchen lisinopril-hydrochlorothiazide (PRINZIDE,ZESTORETIC) 20-12.5 MG per tablet   Oral   Take 2 tablets by mouth daily.          Marland Kitchen loratadine-pseudoephedrine (CLARITIN-D 24-HOUR) 10-240 MG per 24 hr tablet   Oral   Take 1 tablet by mouth daily as needed for allergies.         . Multiple Vitamins-Minerals (MULTIVITAMINS THER. W/MINERALS) TABS   Oral   Take 1 tablet by mouth daily.           Marland Kitchen HYDROcodone-acetaminophen (HYCET) 7.5-325 mg/15 ml solution   Oral   Take 10 mLs by mouth every 4 (four) hours as needed for pain or cough.   60 mL   0    BP 144/83  Pulse 97  Temp(Src) 97.4 F (36.3 C) (Oral)  Resp 24  SpO2 95%  LMP 02/19/2011 Physical Exam  Constitutional: She is oriented to person, place, and time. She  appears well-developed and well-nourished.  HENT:  Head: Normocephalic and atraumatic.  Mouth/Throat: Oropharynx is clear and moist. No oropharyngeal exudate.  Eyes: Conjunctivae and EOM are normal. Pupils are equal, round, and reactive to light.  Neck: Normal range of motion. Neck supple.  Cardiovascular: Normal rate, regular rhythm and intact distal pulses.   Pulmonary/Chest: Effort normal and breath sounds normal. No stridor. No respiratory distress. She exhibits no tenderness.  Abdominal: Soft. Bowel sounds are normal. She exhibits no distension. There is no tenderness.  Musculoskeletal: Normal range of motion. She exhibits no edema.  No calf tenderness or lower extremity edema  Lymphadenopathy:    She has no cervical adenopathy.  Neurological: She is alert and oriented to person, place, and time.  Skin: Skin is warm and dry.  Psychiatric:  Mildly anxious    ED Course   Procedures (including critical care time)  4:54 AM blood pressure 144/80. Patient feels comfortable for discharge home. She is driving tonight and agrees to fill a prescription for Lortab. Work note provided for the next 2 days. Patient is planning to followup with her primary care physician. She agrees to strict return precautions.  1. URI (upper respiratory infection)     MDM  Clinical URI with cough, sore throat, congestion. Normal cardiopulmonary exam and patient declines chest x-ray.   Vital signs, nurse's notes reviewed and considered  Sunnie Nielsen, MD 10/12/12 [phone]

## 2012-10-13 ENCOUNTER — Inpatient Hospital Stay (HOSPITAL_COMMUNITY)
Admission: EM | Admit: 2012-10-13 | Discharge: 2012-10-14 | DRG: 641 | Disposition: A | Discharge status: Home / Self Care | Payer: Medicare Other | Attending: Internal Medicine | Admitting: Internal Medicine

## 2012-10-13 ENCOUNTER — Encounter (HOSPITAL_COMMUNITY): Payer: Self-pay | Admitting: *Deleted

## 2012-10-13 ENCOUNTER — Inpatient Hospital Stay (HOSPITAL_COMMUNITY): Payer: Medicare Other

## 2012-10-13 ENCOUNTER — Emergency Department (HOSPITAL_COMMUNITY): Payer: Medicare Other

## 2012-10-13 DIAGNOSIS — Z853 Personal history of malignant neoplasm of breast: Secondary | ICD-10-CM

## 2012-10-13 DIAGNOSIS — E871 Hypo-osmolality and hyponatremia: Principal | ICD-10-CM

## 2012-10-13 DIAGNOSIS — M129 Arthropathy, unspecified: Secondary | ICD-10-CM | POA: Diagnosis present

## 2012-10-13 DIAGNOSIS — R0989 Other specified symptoms and signs involving the circulatory and respiratory systems: Secondary | ICD-10-CM | POA: Diagnosis present

## 2012-10-13 DIAGNOSIS — Z923 Personal history of irradiation: Secondary | ICD-10-CM

## 2012-10-13 DIAGNOSIS — R42 Dizziness and giddiness: Secondary | ICD-10-CM | POA: Diagnosis present

## 2012-10-13 DIAGNOSIS — E039 Hypothyroidism, unspecified: Secondary | ICD-10-CM | POA: Diagnosis present

## 2012-10-13 DIAGNOSIS — R0602 Shortness of breath: Secondary | ICD-10-CM

## 2012-10-13 DIAGNOSIS — Z87891 Personal history of nicotine dependence: Secondary | ICD-10-CM

## 2012-10-13 DIAGNOSIS — I059 Rheumatic mitral valve disease, unspecified: Secondary | ICD-10-CM | POA: Diagnosis present

## 2012-10-13 DIAGNOSIS — I10 Essential (primary) hypertension: Secondary | ICD-10-CM

## 2012-10-13 DIAGNOSIS — R531 Weakness: Secondary | ICD-10-CM

## 2012-10-13 DIAGNOSIS — Z9221 Personal history of antineoplastic chemotherapy: Secondary | ICD-10-CM

## 2012-10-13 DIAGNOSIS — F411 Generalized anxiety disorder: Secondary | ICD-10-CM | POA: Diagnosis present

## 2012-10-13 DIAGNOSIS — J069 Acute upper respiratory infection, unspecified: Secondary | ICD-10-CM

## 2012-10-13 DIAGNOSIS — R51 Headache: Secondary | ICD-10-CM | POA: Diagnosis present

## 2012-10-13 DIAGNOSIS — R5383 Other fatigue: Secondary | ICD-10-CM

## 2012-10-13 DIAGNOSIS — R110 Nausea: Secondary | ICD-10-CM

## 2012-10-13 LAB — BASIC METABOLIC PANEL
BUN: 10 mg/dL (ref 6–23)
CO2: 28 mEq/L (ref 19–32)
Chloride: 77 mEq/L — ABNORMAL LOW (ref 96–112)
Chloride: 84 mEq/L — ABNORMAL LOW (ref 96–112)
Creatinine, Ser: 0.52 mg/dL (ref 0.50–1.10)
GFR calc Af Amer: >90 mL/min (ref >90)
GFR calc non Af Amer: >90 mL/min (ref >90)
Potassium: 3.4 mEq/L — ABNORMAL LOW (ref 3.5–5.1)
Sodium: 122 mEq/L — ABNORMAL LOW (ref 135–145)

## 2012-10-13 LAB — CBC WITH DIFFERENTIAL/PLATELET
Basophils Absolute: 0 10*3/uL (ref 0.0–0.1)
Basophils Relative: 0 % (ref 0–1)
HCT: 39 % (ref 36.0–46.0)
MCHC: 37.2 g/dL — ABNORMAL HIGH (ref 30.0–36.0)
Monocytes Absolute: 0.6 10*3/uL (ref 0.1–1.0)
Neutro Abs: 6.6 10*3/uL (ref 1.7–7.7)
RDW: 11.9 % (ref 11.5–15.5)

## 2012-10-13 LAB — URINALYSIS, ROUTINE W REFLEX MICROSCOPIC
Bilirubin Urine: NEGATIVE
Glucose, UA: NEGATIVE mg/dL
Hgb urine dipstick: NEGATIVE
Ketones, ur: 15 mg/dL — AB
pH: 6.5 (ref 5.0–8.0)

## 2012-10-13 LAB — CBC
MCV: 89.8 fL (ref 78.0–100.0)
Platelets: 271 10*3/uL (ref 150–400)
RDW: 12 % (ref 11.5–15.5)
WBC: 7.3 10*3/uL (ref 4.0–10.5)

## 2012-10-13 LAB — OSMOLALITY: Osmolality: 255 mOsm/kg — ABNORMAL LOW (ref 275–300)

## 2012-10-13 LAB — URINE MICROSCOPIC-ADD ON

## 2012-10-13 LAB — PROTEIN / CREATININE RATIO, URINE

## 2012-10-13 LAB — CREATININE, SERUM
Creatinine, Ser: 0.5 mg/dL (ref 0.50–1.10)
GFR calc Af Amer: >90 mL/min (ref >90)

## 2012-10-13 MED ORDER — ONDANSETRON HCL 4 MG/2ML IJ SOLN: 4.0000 mg | Freq: Four times a day (QID) | INTRAMUSCULAR | Status: DC | PRN

## 2012-10-13 MED ORDER — THERA M PLUS PO TABS: 1.0000 | ORAL_TABLET | Freq: Every day | ORAL | Status: DC

## 2012-10-13 MED ORDER — HYDRALAZINE HCL 20 MG/ML IJ SOLN: 10.0000 mg | INTRAMUSCULAR | Status: DC | PRN

## 2012-10-13 MED ORDER — SODIUM CHLORIDE 0.9 % IJ SOLN: 3.0000 mL | Freq: Two times a day (BID) | INTRAMUSCULAR | Status: DC

## 2012-10-13 MED ORDER — ACETAMINOPHEN 650 MG RE SUPP: 650.0000 mg | Freq: Four times a day (QID) | RECTAL | Status: DC | PRN

## 2012-10-13 MED ORDER — LORATADINE 10 MG PO TABS
10.0000 mg | ORAL_TABLET | Freq: Every day | ORAL | Status: DC
  Filled 2012-10-13 (×2): qty 1

## 2012-10-13 MED ORDER — ACETAMINOPHEN 325 MG PO TABS: 650.0000 mg | ORAL_TABLET | Freq: Four times a day (QID) | ORAL | Status: DC | PRN

## 2012-10-13 MED ORDER — LISINOPRIL 40 MG PO TABS
40.0000 mg | ORAL_TABLET | Freq: Every day | ORAL | Status: DC
  Administered 2012-10-14: 40 mg via ORAL
  Filled 2012-10-13: qty 1

## 2012-10-13 MED ORDER — DEXTROMETHORPHAN POLISTIREX 30 MG/5ML PO LQCR
60.0000 mg | Freq: Two times a day (BID) | ORAL | Status: DC | PRN
  Filled 2012-10-13: qty 10

## 2012-10-13 MED ORDER — ASPIRIN EC 81 MG PO TBEC
162.0000 mg | DELAYED_RELEASE_TABLET | Freq: Every day | ORAL | Status: DC
  Filled 2012-10-13: qty 2

## 2012-10-13 MED ORDER — LEVOTHYROXINE SODIUM 50 MCG PO TABS
50.0000 ug | ORAL_TABLET | Freq: Every day | ORAL | Status: DC
  Filled 2012-10-13: qty 1

## 2012-10-13 MED ORDER — ASPIRIN EC 81 MG PO TBEC
162.0000 mg | DELAYED_RELEASE_TABLET | Freq: Every day | ORAL | Status: DC
  Administered 2012-10-13: 162 mg via ORAL
  Filled 2012-10-13 (×2): qty 2

## 2012-10-13 MED ORDER — SODIUM CHLORIDE 0.9 % IV SOLN
INTRAVENOUS | Status: DC
  Administered 2012-10-13: 19:00:00 via INTRAVENOUS

## 2012-10-13 MED ORDER — SODIUM CHLORIDE 0.9 % IV BOLUS (SEPSIS)
500.0000 mL | Freq: Once | INTRAVENOUS | Status: AC
  Administered 2012-10-13: 500 mL via INTRAVENOUS

## 2012-10-13 MED ORDER — METOPROLOL TARTRATE 12.5 MG HALF TABLET
12.5000 mg | ORAL_TABLET | Freq: Two times a day (BID) | ORAL | Status: DC
  Administered 2012-10-13: 12.5 mg via ORAL
  Filled 2012-10-13 (×3): qty 1

## 2012-10-13 MED ORDER — ADULT MULTIVITAMIN W/MINERALS CH
1.0000 | ORAL_TABLET | Freq: Every day | ORAL | Status: DC
  Filled 2012-10-13 (×2): qty 1

## 2012-10-13 MED ORDER — ENOXAPARIN SODIUM 40 MG/0.4ML ~~LOC~~ SOLN
40.0000 mg | SUBCUTANEOUS | Status: DC
  Administered 2012-10-13: 40 mg via SUBCUTANEOUS
  Filled 2012-10-13 (×2): qty 0.4

## 2012-10-13 MED ORDER — DM-GUAIFENESIN ER 30-600 MG PO TB12
1.0000 | ORAL_TABLET | Freq: Two times a day (BID) | ORAL | Status: DC
  Administered 2012-10-13: 1 via ORAL
  Filled 2012-10-13 (×3): qty 1

## 2012-10-13 MED ORDER — ONDANSETRON HCL 4 MG PO TABS: 4.0000 mg | ORAL_TABLET | Freq: Four times a day (QID) | ORAL | Status: DC | PRN

## 2012-10-13 NOTE — ED Notes (Signed)
Pt reports upper resp infection for past week.  Has been taking claritin and delsym.  Pt reports being seen in ER for hypertension 3 days ago.  Pt feels sick, Nausea and unable to sleep.  Pt states her blood pressure was high in the 180s, she is congested with headache in the front of her head.  Pt denies having chest pain.  Pt alert oriented X4

## 2012-10-13 NOTE — ED Notes (Signed)
Pt states that she started getting fever last Wednesday and Thursday and then progressively became nauseated, aching, and felt like she was getting a sinus congestion.  Pt states that she started coughing and was taking claritin.  Pt has high bp and took some delsyn on Sunday and stated bp was high.  Pt states she chewed up a lisinopril and swallowed it with hot water and was seen at ED.  Pt states bp continued to go up and down.

## 2012-10-13 NOTE — ED Provider Notes (Signed)
CSN: [id number]     Arrival date & time 10/13/12  1024 History     First MD Initiated Contact with Patient 10/13/12 1123     No chief complaint on file.  (Consider location/radiation/quality/duration/timing/severity/associated sxs/prior Treatment) HPI Comments: 67 yo pt with htn presents with worsening cough and sinus pressure since Wednesday, pt was seen in ED with htn after taking dextromethorphan for which she stopped on Sunday however her sxs persist.  Sinus green production.  Mild generalized HA.  No smoking.  No cardiac hx.  No fevers.  Pt feels fatigue from the URI.  No current abx.  Nothing improves.   The history is provided by the patient.    Past Medical History  Diagnosis Date  . Hypertension   . Mitral valve prolapse     dx'd 1970's  . Hypothyroidism   . Breast cancer 2005    right lumpectomy, chemotherapy with A/C combo  & Taxol, mammogram 02/06/11 without recurrence  . Arthritis     hands and lower back  . Dyspnea     Echocardiogram 02/20/11: EF 60-65%.;  ETT-myoveiw 12/12 normal with EF 80%   Past Surgical History  Procedure Laterality Date  . Appendectomy  1955  . Breast lumpectomy  2005    right  . Tonsillectomy      "as a child"   Family History  Problem Relation Age of Onset  . Coronary artery disease Brother 25    died of first MI at 6  . Coronary artery disease Father     had juvenile DM most of his life, had 3 MI's the first in his early 80's, died at 37  . Diabetes Father    History  Substance Use Topics  . Smoking status: Never Smoker   . Smokeless tobacco: Never Used  . Alcohol Use: 5.4 oz/week    9 Glasses of wine per week   OB History   Grav Para Term Preterm Abortions TAB SAB Ect Mult Living                 Review of Systems  Constitutional: Positive for fatigue. Negative for fever and chills.  HENT: Positive for congestion, sore throat and sinus pressure. Negative for neck pain and neck stiffness.   Eyes: Negative for visual  disturbance.  Respiratory: Positive for cough. Negative for shortness of breath.   Cardiovascular: Negative for chest pain.  Gastrointestinal: Negative for vomiting and abdominal pain.  Genitourinary: Negative for dysuria and flank pain.  Musculoskeletal: Negative for back pain.  Skin: Negative for rash.  Neurological: Positive for headaches. Negative for light-headedness.    Allergies  Other  Home Medications   Current Outpatient Rx  Name  Route  Sig  Dispense  Refill  . Ascorbic Acid (VITAMIN C PO)   Oral   Take 1 tablet by mouth daily.         Marland Kitchen aspirin EC 81 MG tablet   Oral   Take 162 mg by mouth daily.          . Calcium Carbonate-Vitamin D (CALTRATE 600+D PO)   Oral   Take 2 tablets by mouth daily.         . cholecalciferol (VITAMIN D) 1000 UNITS tablet   Oral   Take 1,000 Units by mouth daily.         Marland Kitchen dextromethorphan (DELSYM) 30 MG/5ML liquid   Oral   Take 60 mg by mouth 2 (two) times daily as needed for cough.         Marland Kitchen  Flaxseed, Linseed, (FLAX SEEDS PO)   Oral   Take 2 tablets by mouth daily.          Marland Kitchen GARLIC PO   Oral   Take 1 tablet by mouth daily.           Marland Kitchen HYDROcodone-acetaminophen (HYCET) 7.5-325 mg/15 ml solution   Oral   Take 10 mLs by mouth every 4 (four) hours as needed for pain or cough.   60 mL   0   . levothyroxine (SYNTHROID, LEVOTHROID) 50 MCG tablet   Oral   Take 50 mcg by mouth daily.           Marland Kitchen lisinopril-hydrochlorothiazide (PRINZIDE,ZESTORETIC) 20-12.5 MG per tablet   Oral   Take 2 tablets by mouth daily.          Marland Kitchen loratadine (CLARITIN) 10 MG tablet   Oral   Take 10 mg by mouth daily.         . Multiple Vitamins-Minerals (MULTIVITAMINS THER. W/MINERALS) TABS   Oral   Take 1 tablet by mouth daily.            BP 144/93  Pulse 91  Temp(Src) 98.2 F (36.8 C) (Oral)  Resp 18  SpO2 96%  LMP 02/19/2011 Physical Exam  Nursing note and vitals reviewed. Constitutional: She is oriented to  person, place, and time. She appears well-developed and well-nourished.  HENT:  Head: Normocephalic and atraumatic.  Mild dry mm  Eyes: Conjunctivae are normal. Right eye exhibits no discharge. Left eye exhibits no discharge.  Neck: Normal range of motion. Neck supple. No tracheal deviation present.  Cardiovascular: Normal rate and regular rhythm.   Pulmonary/Chest: Effort normal and breath sounds normal.  Abdominal: Soft. She exhibits no distension. There is no tenderness. There is no guarding.  Musculoskeletal: She exhibits no edema.  Neurological: She is alert and oriented to person, place, and time. No cranial nerve deficit.  Skin: Skin is warm. No rash noted.  Psychiatric: She has a normal mood and affect.    ED Course   Procedures (including critical care time)  Labs Reviewed - No data to display No results found. No diagnosis found.  MDM  URI/ sinusitis.  Improved with fluid bolus.  BP mild elevated.  Na came back 118, no issues in the past.  Pt has been drinking increased free water but  No hs of similar.   Pt on HCTZ. CXR reviewed, no acute findings.  CT head for atypical HA for pt, gradual onset.  Dg Chest 2 View  10/13/2012   *RADIOLOGY REPORT*  Clinical Data: Cough  CHEST - 2 VIEW  Comparison: 02/19/2011  Findings: The lungs are clear.  Negative for infiltrate effusion or heart failure.  Port-A-Cath tip in the SVC unchanged.  IMPRESSION: No active cardiopulmonary abnormality.  No interval change from the prior study.   Original Report Authenticated By: Janeece Riggers, M.D.  Admitted tele. Spoke with Dr Malachi Bonds.  Enid Skeens, MD 10/13/12 [phone]

## 2012-10-13 NOTE — H&P (Signed)
[facility] History and Physical  Madison Nunez:[id number] DOB: 1945/12/28 DOA: 10/13/2012  Referring physician:  Blane Ohara PCP:  Maryelizabeth Rowan, MD   Chief Complaint:  Sinus congestion, rhinorrhea  HPI:  The patient is a 67 y.o. year-old female with history of hypertension, hypothyroidism, breast cancer status post lumpectomy, chemotherapy and radiation, currently in remission, who presents with sinus congestion and headache.  The patient was last at their baseline health several weeks ago.  Approximately one week ago she developed sinus congestion, rhinorrhea, cough, mild sore throat and low-grade fevers. She presented to the emergency department on August 11 because she checked her blood pressure despite taking her blood pressure medication and her blood pressure was elevated.  She was diagnosed with an upper respiratory infection and sent home. She represented to the emergency department today with ongoing sinus congestion and repeatedly elevated high blood pressures. She denied taking any medications over-the-counter such as Benadryl or Sudafed which may have increased her blood pressure. She states she has been taking lots of water in ED and almost nothing for the last several days because she has not felt well. She has continued to take her blood pressure medications including her diuretics. Today she felt increasingly lightheaded and off-balance and generally unwell.  In the emergency department, her labs were notable for a sodium of 118, chloride 77, normal CBC, CT of the head which demonstrated chronic sinus disease, and a chest x-ray which demonstrated no evidence of pneumonia.  Her blood pressure was mildly elevated initially at 144/93 but trended down to within normal limits quickly. She is being admitted for lightheadedness in the setting of hyponatremia.    Review of Systems:  Denies weight loss or gain, changes to hearing and vision. Denies chest pain and palpitations.   Denies SOB, wheezing.  Denies nausea, vomiting, constipation, diarrhea.  Denies dysuria, frequency, urgency, polyuria, polydipsia.  Denies hematemesis, blood in stools, melena, abnormal bruising or bleeding.  Chronic right submandibular lymphadenopathy.  Chronic low back pain myalgias.  Denies  rash or ulcer.  Denies lower extremity edema.  Denies focal numbness, weakness, slurred speech, confusion, facial droop.  Positive anxiety and no depression.    Past Medical History  Diagnosis Date  . Hypertension   . Hypothyroidism   . Breast cancer 2005    right lumpectomy, chemotherapy with A/C combo  & Taxol, mammogram 02/06/11 without recurrence  . Arthritis     hands and lower back  . Dyspnea     Echocardiogram 02/20/11: EF 60-65%.;  ETT-myoveiw 12/12 normal with EF 80%   Past Surgical History  Procedure Laterality Date  . Appendectomy  1955  . Breast lumpectomy  2005    right  . Tonsillectomy      "as a child"   Social History:  reports that she has quit smoking. Her smoking use included Cigarettes. She has a 5 pack-year smoking history. She has never used smokeless tobacco. She reports that she drinks about 5.4 ounces of alcohol per week. She reports that she does not use illicit drugs. Lives alone.    Allergies  Allergen Reactions  . Other Hives    Steroid used prior to chemo     Family History  Problem Relation Age of Onset  . Coronary artery disease Brother 72    died of first MI at 66  . Coronary artery disease Father     had juvenile DM most of his life, had 3 MI's the first in his early 15's, died at  49  . Diabetes Father      Prior to Admission medications   Medication Sig Start Date End Date Taking? Authorizing Provider  Ascorbic Acid (VITAMIN C PO) Take 1 tablet by mouth daily.   Yes Historical Provider, MD  aspirin EC 81 MG tablet Take 162 mg by mouth daily.    Yes Historical Provider, MD  Calcium Carbonate-Vitamin D (CALTRATE 600+D PO) Take 2 tablets by mouth daily.    Yes Historical Provider, MD  cholecalciferol (VITAMIN D) 1000 UNITS tablet Take 1,000 Units by mouth daily.   Yes Historical Provider, MD  dextromethorphan (DELSYM) 30 MG/5ML liquid Take 60 mg by mouth 2 (two) times daily as needed for cough.   Yes Historical Provider, MD  Flaxseed, Linseed, (FLAX SEEDS PO) Take 2 tablets by mouth daily.    Yes Historical Provider, MD  GARLIC PO Take 1 tablet by mouth daily.     Yes Historical Provider, MD  HYDROcodone-acetaminophen (HYCET) 7.5-325 mg/15 ml solution Take 10 mLs by mouth every 4 (four) hours as needed for pain or cough. 10/12/12  Yes Sunnie Nielsen, MD  levothyroxine (SYNTHROID, LEVOTHROID) 50 MCG tablet Take 50 mcg by mouth daily.     Yes Historical Provider, MD  lisinopril-hydrochlorothiazide (PRINZIDE,ZESTORETIC) 20-12.5 MG per tablet Take 2 tablets by mouth daily.    Yes Historical Provider, MD  loratadine (CLARITIN) 10 MG tablet Take 10 mg by mouth daily.   Yes Historical Provider, MD  Multiple Vitamins-Minerals (MULTIVITAMINS THER. W/MINERALS) TABS Take 1 tablet by mouth daily.     Yes Historical Provider, MD   Physical Exam: Filed Vitals:   10/13/12 1031 10/13/12 1530 10/13/12 1733 10/13/12 1757  BP: 144/93 123/74 135/79 134/81  Pulse: 91 78 88 81  Temp: 98.2 F (36.8 C)   97.6 F (36.4 C)  TempSrc: Oral   Oral  Resp: 18  18 18   Height:    5\' 4"  (1.626 m)  Weight:    69.899 kg (154 lb 1.6 oz)  SpO2: 96% 96% 97% 95%     General:  Average weight Caucasian female, no acute distress  Eyes:  PERRL, anicteric, non-injected.  ENT:  Nares with swollen turbinates and clear rhinorrhea.  OP clear, non-erythematous without plaques or exudates.  MMM.  Neck:  Supple without TM or JVD.    Lymph:  Mildly enlarged right submandibular lymph node that is nontender, no cervical, supraclavicular, or LAD.  Cardiovascular:  RRR, normal S1, S2, without m/r/g.  2+ pulses, warm extremities  Respiratory:  CTA bilaterally without increased  WOB.  Abdomen:  NABS.  Soft, ND/NT.    Skin:  No rashes or focal lesions.  Musculoskeletal:  Normal bulk and tone.  No LE edema.  Psychiatric:  A & O x 4.  Appropriate affect.  Neurologic:  CN 3-12 intact.  5/5 strength.  Sensation intact.  Labs on Admission:  Basic Metabolic Panel:  Recent Labs Lab 10/13/12 1220 10/13/12 1536 10/13/12 1849  NA 118* 122*  --   K 3.6 3.4*  --   CL 77* 84*  --   CO2 28 28  --   GLUCOSE 106* 92  --   BUN 10 9  --   CREATININE 0.52 0.45* 0.50  CALCIUM 9.1 8.7  --    Liver Function Tests: No results found for this basename: AST, ALT, ALKPHOS, BILITOT, PROT, ALBUMIN,  in the last 168 hours No results found for this basename: LIPASE, AMYLASE,  in the last 168 hours No results found  for this basename: AMMONIA,  in the last 168 hours CBC:  Recent Labs Lab 10/13/12 1220 10/13/12 1849  WBC 8.6 7.3  NEUTROABS 6.6  --   HGB 14.5 14.7  HCT 39.0 39.8  MCV 89.0 89.8  PLT 263 271   Cardiac Enzymes: No results found for this basename: CKTOTAL, CKMB, CKMBINDEX, TROPONINI,  in the last 168 hours  BNP (last 3 results) No results found for this basename: PROBNP,  in the last 8760 hours CBG: No results found for this basename: GLUCAP,  in the last 168 hours  Radiological Exams on Admission: Dg Chest 2 View  10/13/2012   *RADIOLOGY REPORT*  Clinical Data: Cough  CHEST - 2 VIEW  Comparison: 02/19/2011  Findings: The lungs are clear.  Negative for infiltrate effusion or heart failure.  Port-A-Cath tip in the SVC unchanged.  IMPRESSION: No active cardiopulmonary abnormality.  No interval change from the prior study.   Original Report Authenticated By: Janeece Riggers, M.D.   Ct Head Wo Contrast  10/13/2012   *RADIOLOGY REPORT*  Clinical Data: Headache, hypertension  CT HEAD WITHOUT CONTRAST  Technique:  Contiguous axial images were obtained from the base of the skull through the vertex without contrast.  Comparison: None.  Findings: Mild age-related brain  atrophy.  Slight periventricular chronic microvascular ischemic changes diffusely.  No acute intracranial hemorrhage, mass lesion, definite infarction, midline shift, herniation, hydrocephalus, or extra-axial fluid collection. No focal mass effect or edema.  Cisterns patent.  No cerebellar abnormality.  Symmetric orbits.  Mastoids clear.  Scattered mild to moderate ethmoid, sphenoid and maxillary sinus mucosal thickening. No sinus air fluid level.  IMPRESSION: Mild atrophy and chronic microvascular changes.  No acute intracranial finding.  Chronic sinus disease.   Original Report Authenticated By: Judie Petit. Miles Costain, M.D.    Assessment/Plan Active Problems:   HTN (hypertension)   Hypothyroidism   Dizziness   Hyponatremia   Acute upper respiratory infections of unspecified site   Hyponatremia, likely secondary to continuing to take her diuretic and drinking water without much solute intake over the last several days. -  Discontinue diuretic -  Liberalize diet -  Sodium chloride infusion -  Repeat sodium in a few hours and again in the morning -  Plan to gradually raise the sodium to minimize risk of central pontine myelinolysis  Active Problems:   HTN (hypertension), blood pressure was mildly elevated likely due to anxiety. I reassured the patient the is normal to have some labile blood pressures in the setting of acute infection and she should not worry about it. -  Resume all other blood pressure medications except her diuretic -  Consider addition of beta blocker as alternative to HCTZ    Hypothyroidism, abnormal thyroid may contribute to hyponatremia -  TSH -  Continue levothyroxine    Dizziness, likely secondary to hyponatremia and upper respirator infection. She may have a little bit of underlying vertigo. -  Hydration and correct hyponatremia    Acute upper respiratory infections of unspecified site with possible chronic changes on CT -  Continue supportive care for now  Diet:   Regular Access:  PIV IVF:  Normal saline Proph:  Lovenox  Code Status: Full Family Communication: This patient alone Disposition Plan: Pending improvement in sodium and lightheadedness  Time spent: 60 minutes  Notnamed Scholz [facility] Pager [phone]  If 7PM-7AM, please contact night-coverage [email or web address] Password [email or web address] 10/13/2012, 8:16 PM

## 2012-10-14 DIAGNOSIS — E871 Hypo-osmolality and hyponatremia: Principal | ICD-10-CM

## 2012-10-14 LAB — CBC
MCH: 32.8 pg (ref 26.0–34.0)
MCV: 89.9 fL (ref 78.0–100.0)
Platelets: 253 10*3/uL (ref 150–400)
RDW: 12.2 % (ref 11.5–15.5)
WBC: 6.2 10*3/uL (ref 4.0–10.5)

## 2012-10-14 LAB — BASIC METABOLIC PANEL
CO2: 27 mEq/L (ref 19–32)
Calcium: 8.7 mg/dL (ref 8.4–10.5)
Creatinine, Ser: 0.49 mg/dL — ABNORMAL LOW (ref 0.50–1.10)
GFR calc non Af Amer: >90 mL/min (ref >90)

## 2012-10-14 MED ORDER — CARVEDILOL 6.25 MG PO TABS
6.2500 mg | ORAL_TABLET | Freq: Two times a day (BID) | ORAL | Status: DC
  Administered 2012-10-14: 6.25 mg via ORAL
  Filled 2012-10-14 (×3): qty 1

## 2012-10-14 MED ORDER — POTASSIUM CHLORIDE CRYS ER 20 MEQ PO TBCR
40.0000 meq | EXTENDED_RELEASE_TABLET | Freq: Once | ORAL | Status: AC
  Administered 2012-10-14: 40 meq via ORAL
  Filled 2012-10-14: qty 2

## 2012-10-14 MED ORDER — CARVEDILOL 6.25 MG PO TABS: 6.2500 mg | ORAL_TABLET | Freq: Two times a day (BID) | ORAL | Status: DC

## 2012-10-14 MED ORDER — LISINOPRIL 40 MG PO TABS: 40.0000 mg | ORAL_TABLET | Freq: Every day | ORAL | Status: AC

## 2012-10-14 NOTE — Care Management Note (Signed)
    Page 1 of 1   10/14/2012     4:19:51 PM   CARE MANAGEMENT NOTE 10/14/2012  Patient:  Madison Nunez, Madison Nunez   Account Number:  [id number]  Date Initiated:  10/14/2012  Documentation initiated by:  Ipek Westra  Subjective/Objective Assessment:   PT ADM ON 10/13/12 WITH HYPONATREMIA.  PTA, PT INDEPENDENT, LIVES ALONE.     Action/Plan:   PT FOR LIKELY DC TODAY.  NO DC NEEDS ANTICIPATED.   Anticipated DC Date:  10/14/2012   Anticipated DC Plan:  HOME/SELF CARE      DC Planning Services  CM consult      Choice offered to / List presented to:             Status of service:  Completed, signed off Medicare Important Message given?   (If response is "NO", the following Medicare IM given date fields will be blank) Date Medicare IM given:   Date Additional Medicare IM given:    Discharge Disposition:  HOME/SELF CARE  Per UR Regulation:  Reviewed for med. necessity/level of care/duration of stay  If discussed at Long Length of Stay Meetings, dates discussed:    Comments:

## 2012-10-14 NOTE — Progress Notes (Signed)
Assessment unchanged.  Discussed discharge instructions with patient including follow appointment, new medicines, and education specific to URI and Hyponatremia.  Patient verbalized understanding.  IV and Tele removed.  Patient left via wheelchair accompanied by [facility] volunteer.

## 2012-10-14 NOTE — Discharge Summary (Signed)
Physician Discharge Summary  Madison Nunez ZOX:[id number] DOB: January 19, 1946 DOA: 10/13/2012  PCP: Maryelizabeth Rowan, MD  Admit date: 10/13/2012 Discharge date: 10/14/2012  Recommendations for Outpatient Follow-up:  1. PCP in 1 week:  Repeat BMP and check HR and blood pressure. Please followup on pending TSH.  Discharge Diagnoses:  Principal Problem:   Hyponatremia Active Problems:   HTN (hypertension)   Hypothyroidism   Dizziness   Acute upper respiratory infections of unspecified site   Discharge Condition: stable, improved  Diet recommendation: regular until follow up with primary care doctor  Wt Readings from Last 3 Encounters:  10/13/12 69.899 kg (154 lb 1.6 oz)  03/07/11 70.761 kg (156 lb)  02/25/11 68.947 kg (152 lb)    History of present illness:  The patient is a 67 y.o. year-old female with history of hypertension, hypothyroidism, breast cancer status post lumpectomy, chemotherapy and radiation, currently in remission, who presents with sinus congestion and headache. The patient was last at their baseline health several weeks ago. Approximately one week ago she developed sinus congestion, rhinorrhea, cough, mild sore throat and low-grade fevers. She presented to the emergency department on August 11 because she checked her blood pressure despite taking her blood pressure medication and her blood pressure was elevated. She was diagnosed with an upper respiratory infection and sent home. She represented to the emergency department today with ongoing sinus congestion and repeatedly elevated high blood pressures. She denied taking any medications over-the-counter such as Benadryl or Sudafed which may have increased her blood pressure. She states she has been taking lots of water in ED and almost nothing for the last several days because she has not felt well. She has continued to take her blood pressure medications including her diuretics. Today she felt increasingly lightheaded and  off-balance and generally unwell.  In the emergency department, her labs were notable for a sodium of 118, chloride 77, normal CBC, CT of the head which demonstrated chronic sinus disease, and a chest x-ray which demonstrated no evidence of pneumonia. Her blood pressure was mildly elevated initially at 144/93 but trended down to within normal limits quickly. She is being admitted for lightheadedness in the setting of hyponatremia.    Hospital Course:   Hyponatremia, likely secondary to continuing to take her diuretic and drinking water without much solute intake over the last several days.  Serum osms were 255.  Urine was dilute with ketones.  Urine osms 216 and sodium 48 which I think represents a combination of primary polydipsia AND HCTZ use on the day the urine was collected.  Her sodium gradually trended up to 127 over the course of a day with normal saline infusion, liberalized diet and holding her diuretic.  Her lightheadedness and dizziness improved, she slept well, and felt ready to go home on the day of discharge.  Repeat BMP in one week by her primary care doctor to ensure complete normalization of her sodium.  Active Problems:  HTN (hypertension), blood pressure was mildly elevated likely due to anxiety. I reassured the patient the is normal to have some labile blood pressures in the setting of acute infection.  She continued her blood pressure medication to cover her diuretic. Her blood pressures were mildly elevated and I substituted carvedilol for her HCTZ. She should have a blood pressure check and heart rate check in one week by her primary care doctor.  Hypothyroidism, abnormal thyroid may contribute to hyponatremia.  TSH is pending at the time of discharge. She continued her previous levothyroxine  dose.  Dizziness, likely secondary to hyponatremia and upper respirator infection.  Hydration and partially corrected hyponatremia resolved her symptoms.  Acute upper respiratory  infections of unspecified site with possible chronic changes on CT.  Continued supportive care for now, however if symptoms continue to persist, she can talk to her primary care doctor in approximately one week for treatment of sinusitis.     Procedures:  CT sinuses  Chest x-ray  Consultations:  None  Discharge Exam: Filed Vitals:   10/14/12 0639  BP: 148/79  Pulse: 86  Temp: 98.4 F (36.9 C)  Resp: 18   Filed Vitals:   10/13/12 1757 10/13/12 2024 10/14/12 0542 10/14/12 0639  BP: 134/81 101/62 136/88 148/79  Pulse: 81 79 69 86  Temp: 97.6 F (36.4 C) 98.4 F (36.9 C) 97.9 F (36.6 C) 98.4 F (36.9 C)  TempSrc: Oral Oral Oral Oral  Resp: 18 18 18 18   Height: 5\' 4"  (1.626 m)     Weight: 69.899 kg (154 lb 1.6 oz)     SpO2: 95% 95% 95% 100%   Patient states that she feels much better today. General: Caucasian female, no acute distress, nasal sounding HEENT: Normocephalic atraumatic, no point tenderness over the sinuses, moist mucous membranes Cardiovascular: Regular rate and rhythm, no murmurs rubs or gallops Respiratory: Consultation bilaterally Abdomen: Normal active bowel sounds, soft, nondistended, nontender MSK, no lower extremity edema, 2+ pulses  Discharge Instructions      Discharge Orders   Future Orders Complete By Expires   Call MD for:  difficulty breathing, headache or visual disturbances  As directed    Call MD for:  extreme fatigue  As directed    Call MD for:  hives  As directed    Call MD for:  persistant dizziness or light-headedness  As directed    Call MD for:  persistant nausea and vomiting  As directed    Call MD for:  severe uncontrolled pain  As directed    Call MD for:  temperature >100.4  As directed    Diet general  As directed    Discharge instructions  As directed    Comments:     You are hospitalized with a cold, and low sodium level.  Your diuretic HCTZ was discontinued and you were started on carvedilol for your blood pressure.  Please start taking a combination pill lisinopril-hydrochlorothiazide, and take individual tabs for lisinopril and carvedilol instead.  Please eat a regular diet this week and stay hydrated. Followup with your primary care doctor in one week for repeat blood work and heart rate and blood pressure check.   Increase activity slowly  As directed        Medication List    STOP taking these medications       lisinopril-hydrochlorothiazide 20-12.5 MG per tablet  Commonly known as:  PRINZIDE,ZESTORETIC      TAKE these medications       aspirin EC 81 MG tablet  Take 162 mg by mouth daily.     CALTRATE 600+D PO  Take 2 tablets by mouth daily.     carvedilol 6.25 MG tablet  Commonly known as:  COREG  Take 1 tablet (6.25 mg total) by mouth 2 (two) times daily with a meal.     cholecalciferol 1000 UNITS tablet  Commonly known as:  VITAMIN D  Take 1,000 Units by mouth daily.     dextromethorphan 30 MG/5ML liquid  Commonly known as:  DELSYM  Take 60 mg by  mouth 2 (two) times daily as needed for cough.     FLAX SEEDS PO  Take 2 tablets by mouth daily.     GARLIC PO  Take 1 tablet by mouth daily.     HYDROcodone-acetaminophen 7.5-325 mg/15 ml solution  Commonly known as:  HYCET  Take 10 mLs by mouth every 4 (four) hours as needed for pain or cough.     levothyroxine 50 MCG tablet  Commonly known as:  SYNTHROID, LEVOTHROID  Take 50 mcg by mouth daily.     lisinopril 40 MG tablet  Commonly known as:  PRINIVIL,ZESTRIL  Take 1 tablet (40 mg total) by mouth daily.     loratadine 10 MG tablet  Commonly known as:  CLARITIN  Take 10 mg by mouth daily.     multivitamins ther. w/minerals Tabs tablet  Take 1 tablet by mouth daily.     VITAMIN C PO  Take 1 tablet by mouth daily.       Follow-up Information   Schedule an appointment as soon as possible for a visit with [facility], MD.   Specialty:  Family Medicine   Contact information:   [address] [place] [address] [phone]        The results of significant diagnostics from this hospitalization (including imaging, microbiology, ancillary and laboratory) are listed below for reference.    Significant Diagnostic Studies: Dg Chest 2 View  10/13/2012   *RADIOLOGY REPORT*  Clinical Data: Cough  CHEST - 2 VIEW  Comparison: 02/19/2011  Findings: The lungs are clear.  Negative for infiltrate effusion or heart failure.  Port-A-Cath tip in the SVC unchanged.  IMPRESSION: No active cardiopulmonary abnormality.  No interval change from the prior study.   Original Report Authenticated By: Janeece Riggers, M.D.   Ct Head Wo Contrast  10/13/2012   *RADIOLOGY REPORT*  Clinical Data: Headache, hypertension  CT HEAD WITHOUT CONTRAST  Technique:  Contiguous axial images were obtained from the base of the skull through the vertex without contrast.  Comparison: None.  Findings: Mild age-related brain atrophy.  Slight periventricular chronic microvascular ischemic changes diffusely.  No acute intracranial hemorrhage, mass lesion, definite infarction, midline shift, herniation, hydrocephalus, or extra-axial fluid collection. No focal mass effect or edema.  Cisterns patent.  No cerebellar abnormality.  Symmetric orbits.  Mastoids clear.  Scattered mild to moderate ethmoid, sphenoid and maxillary sinus mucosal thickening. No sinus air fluid level.  IMPRESSION: Mild atrophy and chronic microvascular changes.  No acute intracranial finding.  Chronic sinus disease.   Original Report Authenticated By: Judie Petit. Miles Costain, M.D.   Mm Digital Diagnostic Bilat  09/22/2012   *RADIOLOGY REPORT*  Clinical Data:  The patient underwent right lumpectomy, chemotherapy and radiation therapy for breast cancer in 2000 07/2004.  DIGITAL DIAGNOSTIC BILATERAL MAMMOGRAM WITH CAD  Comparison: 02/06/2011, 11/29/2009, 09/21/2008, 08/26/2007,08/21/2006  Findings:  ACR Breast Density Category b:  There are scattered areas of fibroglandular  density.  Right lumpectomy changes are present.  There is no suspicious dominant mass, nonsurgical architectural distortion, or calcification to suggest malignancy.  Mammographic images were processed with CAD.  IMPRESSION: No mammographic evidence of malignancy.  RECOMMENDATION: Yearly screening mammography may now be instituted.  I have discussed the findings and recommendations with the patient. Results were also provided in writing at the conclusion of the visit.  If applicable, a reminder letter will be sent to the patient regarding her next appointment.  BI-RADS CATEGORY 2:  Benign finding(s).   Original Report  Authenticated By: Cain Saupe, M.D.    Microbiology: No results found for this or any previous visit (from the past 240 hour(s)).   Labs: Basic Metabolic Panel:  Recent Labs Lab 10/13/12 1220 10/13/12 1536 10/13/12 1849 10/14/12 0450  NA 118* 122*  --  127*  K 3.6 3.4*  --  3.3*  CL 77* 84*  --  90*  CO2 28 28  --  27  GLUCOSE 106* 92  --  96  BUN 10 9  --  10  CREATININE 0.52 0.45* 0.50 0.49*  CALCIUM 9.1 8.7  --  8.7   Liver Function Tests: No results found for this basename: AST, ALT, ALKPHOS, BILITOT, PROT, ALBUMIN,  in the last 168 hours No results found for this basename: LIPASE, AMYLASE,  in the last 168 hours No results found for this basename: AMMONIA,  in the last 168 hours CBC:  Recent Labs Lab 10/13/12 1220 10/13/12 1849 10/14/12 0450  WBC 8.6 7.3 6.2  NEUTROABS 6.6  --   --   HGB 14.5 14.7 13.3  HCT 39.0 39.8 36.4  MCV 89.0 89.8 89.9  PLT 263 271 253   Cardiac Enzymes: No results found for this basename: CKTOTAL, CKMB, CKMBINDEX, TROPONINI,  in the last 168 hours BNP: BNP (last 3 results) No results found for this basename: PROBNP,  in the last 8760 hours CBG: No results found for this basename: GLUCAP,  in the last 168 hours  Time coordinating discharge: 45 minutes  Signed:  Devaunte Gasparini  [facility] 10/14/2012, 8:12  AM

## 2013-10-02 ENCOUNTER — Encounter (HOSPITAL_COMMUNITY): Payer: Self-pay | Admitting: Emergency Medicine

## 2013-10-02 ENCOUNTER — Emergency Department (HOSPITAL_COMMUNITY)
Admission: EM | Admit: 2013-10-02 | Discharge: 2013-10-02 | Disposition: A | Discharge status: Home / Self Care | Payer: Medicare Other | Attending: Emergency Medicine | Admitting: Emergency Medicine

## 2013-10-02 ENCOUNTER — Emergency Department (HOSPITAL_COMMUNITY): Payer: Medicare Other

## 2013-10-02 DIAGNOSIS — R000 Tachycardia, unspecified: Secondary | ICD-10-CM | POA: Diagnosis not present

## 2013-10-02 DIAGNOSIS — Z9889 Other specified postprocedural states: Secondary | ICD-10-CM | POA: Insufficient documentation

## 2013-10-02 DIAGNOSIS — Z9221 Personal history of antineoplastic chemotherapy: Secondary | ICD-10-CM | POA: Insufficient documentation

## 2013-10-02 DIAGNOSIS — E039 Hypothyroidism, unspecified: Secondary | ICD-10-CM | POA: Diagnosis not present

## 2013-10-02 DIAGNOSIS — Z7982 Long term (current) use of aspirin: Secondary | ICD-10-CM | POA: Diagnosis not present

## 2013-10-02 DIAGNOSIS — Z87891 Personal history of nicotine dependence: Secondary | ICD-10-CM | POA: Diagnosis not present

## 2013-10-02 DIAGNOSIS — R6883 Chills (without fever): Secondary | ICD-10-CM | POA: Insufficient documentation

## 2013-10-02 DIAGNOSIS — I10 Essential (primary) hypertension: Secondary | ICD-10-CM | POA: Insufficient documentation

## 2013-10-02 DIAGNOSIS — M129 Arthropathy, unspecified: Secondary | ICD-10-CM | POA: Insufficient documentation

## 2013-10-02 DIAGNOSIS — Z853 Personal history of malignant neoplasm of breast: Secondary | ICD-10-CM | POA: Diagnosis not present

## 2013-10-02 DIAGNOSIS — R110 Nausea: Secondary | ICD-10-CM | POA: Insufficient documentation

## 2013-10-02 DIAGNOSIS — Z79899 Other long term (current) drug therapy: Secondary | ICD-10-CM | POA: Insufficient documentation

## 2013-10-02 LAB — COMPREHENSIVE METABOLIC PANEL
ALK PHOS: 102 U/L (ref 39–117)
ALT: 22 U/L (ref 0–35)
AST: 24 U/L (ref 0–37)
Albumin: 4.1 g/dL (ref 3.5–5.2)
Anion gap: 13 (ref 5–15)
BUN: 15 mg/dL (ref 6–23)
CO2: 24 mEq/L (ref 19–32)
Calcium: 9.6 mg/dL (ref 8.4–10.5)
Chloride: 97 mEq/L (ref 96–112)
Creatinine, Ser: 0.7 mg/dL (ref 0.50–1.10)
GFR calc Af Amer: >90 mL/min (ref >90)
GFR calc non Af Amer: 87 mL/min — ABNORMAL LOW (ref >90)
Glucose, Bld: 146 mg/dL — ABNORMAL HIGH (ref 70–99)
Potassium: 4.1 mEq/L (ref 3.7–5.3)
Sodium: 134 mEq/L — ABNORMAL LOW (ref 137–147)
TOTAL PROTEIN: 7.8 g/dL (ref 6.0–8.3)
Total Bilirubin: 0.4 mg/dL (ref 0.3–1.2)

## 2013-10-02 LAB — CBC WITH DIFFERENTIAL/PLATELET
BASOS PCT: 0 % (ref 0–1)
Basophils Absolute: 0 10*3/uL (ref 0.0–0.1)
EOS ABS: 0.2 10*3/uL (ref 0.0–0.7)
Eosinophils Relative: 3 % (ref 0–5)
HCT: 42 % (ref 36.0–46.0)
Hemoglobin: 14.5 g/dL (ref 12.0–15.0)
Lymphocytes Relative: 40 % (ref 12–46)
Lymphs Abs: 2.4 10*3/uL (ref 0.7–4.0)
MCH: 32.8 pg (ref 26.0–34.0)
MCHC: 34.5 g/dL (ref 30.0–36.0)
MCV: 95 fL (ref 78.0–100.0)
Monocytes Absolute: 0.3 10*3/uL (ref 0.1–1.0)
Monocytes Relative: 5 % (ref 3–12)
NEUTROS PCT: 52 % (ref 43–77)
Neutro Abs: 3 10*3/uL (ref 1.7–7.7)
PLATELETS: 236 10*3/uL (ref 150–400)
RBC: 4.42 MIL/uL (ref 3.87–5.11)
RDW: 12.5 % (ref 11.5–15.5)
WBC: 5.9 10*3/uL (ref 4.0–10.5)

## 2013-10-02 LAB — URINE MICROSCOPIC-ADD ON

## 2013-10-02 LAB — URINALYSIS, ROUTINE W REFLEX MICROSCOPIC
Bilirubin Urine: NEGATIVE
Glucose, UA: NEGATIVE mg/dL
HGB URINE DIPSTICK: NEGATIVE
Ketones, ur: NEGATIVE mg/dL
Nitrite: NEGATIVE
Protein, ur: NEGATIVE mg/dL
SPECIFIC GRAVITY, URINE: 1.006 (ref 1.005–1.030)
Urobilinogen, UA: 0.2 mg/dL (ref 0.0–1.0)
pH: 6 (ref 5.0–8.0)

## 2013-10-02 LAB — TROPONIN I: Troponin I: <0.3 ng/mL (ref <0.30)

## 2013-10-02 MED ORDER — SODIUM CHLORIDE 0.9 % IV BOLUS (SEPSIS): 500.0000 mL | Freq: Once | INTRAVENOUS | Status: DC

## 2013-10-02 MED ORDER — SODIUM CHLORIDE 0.9 % IV BOLUS (SEPSIS)
1000.0000 mL | Freq: Once | INTRAVENOUS | Status: AC
  Administered 2013-10-02: 1000 mL via INTRAVENOUS

## 2013-10-02 MED ORDER — ONDANSETRON HCL 4 MG/2ML IJ SOLN
4.0000 mg | Freq: Once | INTRAMUSCULAR | Status: AC
  Administered 2013-10-02: 4 mg via INTRAVENOUS
  Filled 2013-10-02: qty 2

## 2013-10-02 NOTE — ED Notes (Signed)
Patient reports several episodes this week of nausea, dizziness, feeling like her BP is increased, and feeling like her heart is racing. Pt states she has these episodes sometimes after she takes her carvedilol, pt states she wakes up at 3a to take her medications so she can sleep afterwards because they make her feel so bad. Pt states she has taken her medications like usual and as prescribed but this week she has more frequent and longer lasting episodes than usual. This afternoon patient started to feel nauseas, dizzy, and the feeling like her heart was racing and it is has not went away. On exam patients heart sustaining in the 115s, this is not patients norm. She denies any chest pain. Reports mild sob with the tachycardia. Pt states that she went to the doctors on Thursday after becoming drenched in sweat while mowing the grass, states her blood pressure was high, but was discharged home.

## 2013-10-02 NOTE — Discharge Instructions (Signed)
Stop your Carvediolol. Check your Blood Pressure morning, and afternoon and record. Recheck with your Dr. [facility] regarding Blood Pressure medication changes.  Nausea and Vomiting Nausea is a sick feeling that often comes before throwing up (vomiting). Vomiting is a reflex where stomach contents come out of your mouth. Vomiting can cause severe loss of body fluids (dehydration). Children and elderly adults can become dehydrated quickly, especially if they also have diarrhea. Nausea and vomiting are symptoms of a condition or disease. It is important to find the cause of your symptoms. CAUSES   Direct irritation of the stomach lining. This irritation can result from increased acid production (gastroesophageal reflux disease), infection, food poisoning, taking certain medicines (such as nonsteroidal anti-inflammatory drugs), alcohol use, or tobacco use.  Signals from the brain.These signals could be caused by a headache, heat exposure, an inner ear disturbance, increased pressure in the brain from injury, infection, a tumor, or a concussion, pain, emotional stimulus, or metabolic problems.  An obstruction in the gastrointestinal tract (bowel obstruction).  Illnesses such as diabetes, hepatitis, gallbladder problems, appendicitis, kidney problems, cancer, sepsis, atypical symptoms of a heart attack, or eating disorders.  Medical treatments such as chemotherapy and radiation.  Receiving medicine that makes you sleep (general anesthetic) during surgery. DIAGNOSIS Your caregiver may ask for tests to be done if the problems do not improve after a few days. Tests may also be done if symptoms are severe or if the reason for the nausea and vomiting is not clear. Tests may include:  Urine tests.  Blood tests.  Stool tests.  Cultures (to look for evidence of infection).  X-rays or other imaging studies. Test results can help your caregiver make decisions about treatment or the need for  additional tests. TREATMENT You need to stay well hydrated. Drink frequently but in small amounts.You may wish to drink water, sports drinks, clear broth, or eat frozen ice pops or gelatin dessert to help stay hydrated.When you eat, eating slowly may help prevent nausea.There are also some antinausea medicines that may help prevent nausea. HOME CARE INSTRUCTIONS   Take all medicine as directed by your caregiver.  If you do not have an appetite, do not force yourself to eat. However, you must continue to drink fluids.  If you have an appetite, eat a normal diet unless your caregiver tells you differently.  Eat a variety of complex carbohydrates (rice, wheat, potatoes, bread), lean meats, yogurt, fruits, and vegetables.  Avoid high-fat foods because they are more difficult to digest.  Drink enough water and fluids to keep your urine clear or pale yellow.  If you are dehydrated, ask your caregiver for specific rehydration instructions. Signs of dehydration may include:  Severe thirst.  Dry lips and mouth.  Dizziness.  Dark urine.  Decreasing urine frequency and amount.  Confusion.  Rapid breathing or pulse. SEEK IMMEDIATE MEDICAL CARE IF:   You have blood or brown flecks (like coffee grounds) in your vomit.  You have black or bloody stools.  You have a severe headache or stiff neck.  You are confused.  You have severe abdominal pain.  You have chest pain or trouble breathing.  You do not urinate at least once every 8 hours.  You develop cold or clammy skin.  You continue to vomit for longer than 24 to 48 hours.  You have a fever. MAKE SURE YOU:   Understand these instructions.  Will watch your condition.  Will get help right away if you are not doing  well or get worse. Document Released: 02/18/2005 Document Revised: 05/13/2011 Document Reviewed: 07/18/2010 [facility] Patient Information 2015 [employer], [place]. This information is not intended to replace advice  given to you by your health care provider. Make sure you discuss any questions you have with your health care provider.  Hypertension Hypertension, commonly called high blood pressure, is when the force of blood pumping through your arteries is too strong. Your arteries are the blood vessels that carry blood from your heart throughout your body. A blood pressure reading consists of a higher number over a lower number, such as 110/72. The higher number (systolic) is the pressure inside your arteries when your heart pumps. The lower number (diastolic) is the pressure inside your arteries when your heart relaxes. Ideally you want your blood pressure below 120/80. Hypertension forces your heart to work harder to pump blood. Your arteries may become narrow or stiff. Having hypertension puts you at risk for heart disease, stroke, and other problems.  RISK FACTORS Some risk factors for high blood pressure are controllable. Others are not.  Risk factors you cannot control include:   Race. You may be at higher risk if you are African American.  Age. Risk increases with age.  Gender. Men are at higher risk than women before age 63 years. After age 67, women are at higher risk than men. Risk factors you can control include:  Not getting enough exercise or physical activity.  Being overweight.  Getting too much fat, sugar, calories, or salt in your diet.  Drinking too much alcohol. SIGNS AND SYMPTOMS Hypertension does not usually cause signs or symptoms. Extremely high blood pressure (hypertensive crisis) may cause headache, anxiety, shortness of breath, and nosebleed. DIAGNOSIS  To check if you have hypertension, your health care provider will measure your blood pressure while you are seated, with your arm held at the level of your heart. It should be measured at least twice using the same arm. Certain conditions can cause a difference in blood pressure between your right and left arms. A blood  pressure reading that is higher than normal on one occasion does not mean that you need treatment. If one blood pressure reading is high, ask your health care provider about having it checked again. TREATMENT  Treating high blood pressure includes making lifestyle changes and possibly taking medicine. Living a healthy lifestyle can help lower high blood pressure. You may need to change some of your habits. Lifestyle changes may include:  Following the DASH diet. This diet is high in fruits, vegetables, and whole grains. It is low in salt, red meat, and added sugars.  Getting at least 2 hours of brisk physical activity every week.  Losing weight if necessary.  Not smoking.  Limiting alcoholic beverages.  Learning ways to reduce stress. If lifestyle changes are not enough to get your blood pressure under control, your health care provider may prescribe medicine. You may need to take more than one. Work closely with your health care provider to understand the risks and benefits. HOME CARE INSTRUCTIONS  Have your blood pressure rechecked as directed by your health care provider.   Take medicines only as directed by your health care provider. Follow the directions carefully. Blood pressure medicines must be taken as prescribed. The medicine does not work as well when you skip doses. Skipping doses also puts you at risk for problems.   Do not smoke.   Monitor your blood pressure at home as directed by your health care provider. SEEK  MEDICAL CARE IF:   You think you are having a reaction to medicines taken.  You have recurrent headaches or feel dizzy.  You have swelling in your ankles.  You have trouble with your vision. SEEK IMMEDIATE MEDICAL CARE IF:  You develop a severe headache or confusion.  You have unusual weakness, numbness, or feel faint.  You have severe chest or abdominal pain.  You vomit repeatedly.  You have trouble breathing. MAKE SURE YOU:   Understand  these instructions.  Will watch your condition.  Will get help right away if you are not doing well or get worse. Document Released: 02/18/2005 Document Revised: 07/05/2013 Document Reviewed: 12/11/2012 [facility] Patient Information 2015 [place], [place]. This information is not intended to replace advice given to you by your health care provider. Make sure you discuss any questions you have with your health care provider.

## 2013-10-02 NOTE — ED Notes (Signed)
Pt. Denies nausea and dizziness at time of discharge. Verbalized understanding of discharge paperwork. Denies further needs at this time.

## 2013-10-02 NOTE — ED Provider Notes (Signed)
CSN: [id number]     Arrival date & time 10/02/13  1253 History   First MD Initiated Contact with Patient 10/02/13 1321     Chief Complaint  Patient presents with  . Nausea  . Hypertension     (Consider location/radiation/quality/duration/timing/severity/associated sxs/prior Treatment) HPI Comments: The patient is a 68 y.o. year-old female with history of hypertension, hypothyroidism, breast cancer status post lumpectomy, chemotherapy and radiation, currently in remission, who presents with palpitations since 1030 today. The patient reports similar episodes her last week on Tuesday and Thursday, no aggravating or relieving factors. She reports associated nausea without emesis and lightheadedness. Denies syncope. She denies chest pain. She reports occasional shortness of breath on Thursday, none today. She reports chills, denies fever or. Denies diarrhea. Reports pulse usually 50-60's. Blood pressure usually runs high 120s over 80s at home. She reports compliance with her lisinopril, carvedilol.  The patient reports she drank a "strong" of coffee, later than usual. Last stress  December 2012  Patient is a 68 y.o. female presenting with hypertension. The history is provided by the patient and medical records. No language interpreter was used.  Hypertension Associated symptoms include chills and nausea. Pertinent negatives include no abdominal pain, chest pain or fever.    Past Medical History  Diagnosis Date  . Hypertension   . Hypothyroidism   . Breast cancer 2005    right lumpectomy, chemotherapy with A/C combo  & Taxol, mammogram 02/06/11 without recurrence  . Arthritis     hands and lower back  . Dyspnea     Echocardiogram 02/20/11: EF 60-65%.;  ETT-myoveiw 12/12 normal with EF 80%   Past Surgical History  Procedure Laterality Date  . Appendectomy  1955  . Breast lumpectomy  2005    right  . Tonsillectomy      "as a child"   Family History  Problem Relation Age of Onset  .  Coronary artery disease Brother 32    died of first MI at 27  . Coronary artery disease Father     had juvenile DM most of his life, had 3 MI's the first in his early 26's, died at 74  . Diabetes Father    History  Substance Use Topics  . Smoking status: Former Smoker -- 1.00 packs/day for 5 years    Types: Cigarettes  . Smokeless tobacco: Never Used  . Alcohol Use: 5.4 oz/week    9 Glasses of wine per week   OB History   Grav Para Term Preterm Abortions TAB SAB Ect Mult Living                 Review of Systems  Constitutional: Positive for chills. Negative for fever.  Respiratory: Positive for shortness of breath.   Cardiovascular: Positive for palpitations. Negative for chest pain and leg swelling.  Gastrointestinal: Positive for nausea. Negative for abdominal pain, diarrhea and constipation.  Neurological: Positive for light-headedness. Negative for syncope.      Allergies  Other  Home Medications   Prior to Admission medications   Medication Sig Start Date End Date Taking? Authorizing Provider  Ascorbic Acid (VITAMIN C PO) Take 1 tablet by mouth daily.    Historical Provider, MD  aspirin EC 81 MG tablet Take 162 mg by mouth daily.     Historical Provider, MD  Calcium Carbonate-Vitamin D (CALTRATE 600+D PO) Take 2 tablets by mouth daily.    Historical Provider, MD  carvedilol (COREG) 6.25 MG tablet Take 1 tablet (6.25 mg total) by  mouth 2 (two) times daily with a meal. 10/14/12   Janece Canterbury, MD  cholecalciferol (VITAMIN D) 1000 UNITS tablet Take 1,000 Units by mouth daily.    Historical Provider, MD  dextromethorphan (DELSYM) 30 MG/5ML liquid Take 60 mg by mouth 2 (two) times daily as needed for cough.    Historical Provider, MD  Flaxseed, Linseed, (FLAX SEEDS PO) Take 2 tablets by mouth daily.     Historical Provider, MD  GARLIC PO Take 1 tablet by mouth daily.      Historical Provider, MD  HYDROcodone-acetaminophen (HYCET) 7.5-325 mg/15 ml solution Take 10 mLs by  mouth every 4 (four) hours as needed for pain or cough. 10/12/12   Teressa Lower, MD  levothyroxine (SYNTHROID, LEVOTHROID) 50 MCG tablet Take 50 mcg by mouth daily.      Historical Provider, MD  lisinopril (PRINIVIL,ZESTRIL) 40 MG tablet Take 1 tablet (40 mg total) by mouth daily. 10/14/12   Janece Canterbury, MD  loratadine (CLARITIN) 10 MG tablet Take 10 mg by mouth daily.    Historical Provider, MD  Multiple Vitamins-Minerals (MULTIVITAMINS THER. W/MINERALS) TABS Take 1 tablet by mouth daily.      Historical Provider, MD   BP 145/77  Pulse 114  Temp(Src) 98.2 F (36.8 C) (Oral)  Resp 18  SpO2 97%  LMP 02/19/2011 Physical Exam  Nursing note and vitals reviewed. Constitutional: She is oriented to person, place, and time. She appears well-developed and well-nourished. No distress.  HENT:  Head: Normocephalic and atraumatic.  Eyes: EOM are normal. Pupils are equal, round, and reactive to light.  Neck: Neck supple.  Cardiovascular: Regular rhythm.  Tachycardia present.   Pulses:      Radial pulses are 2+ on the right side, and 2+ on the left side.  No lower extremity edema  Pulmonary/Chest: Not tachypneic. No respiratory distress. She has no decreased breath sounds. She has no wheezes. She has no rhonchi.  Left upper chest port, non-tender to palpation. Patient is able to speak in complete sentences.   Abdominal: Soft. There is no tenderness. There is no rebound.  Musculoskeletal: Normal range of motion.  Neurological: She is alert and oriented to person, place, and time.  Skin: Skin is warm and dry. She is not diaphoretic.  Psychiatric: She has a normal mood and affect. Her behavior is normal.    ED Course  Procedures (including critical care time) Labs Review Labs Reviewed  COMPREHENSIVE METABOLIC PANEL - Abnormal; Notable for the following:    Sodium 134 (*)    Glucose, Bld 146 (*)    GFR calc non Af Amer 87 (*)    All other components within normal limits  URINALYSIS, ROUTINE  W REFLEX MICROSCOPIC - Abnormal; Notable for the following:    Leukocytes, UA SMALL (*)    All other components within normal limits  CBC WITH DIFFERENTIAL  TROPONIN I  URINE MICROSCOPIC-ADD ON    Imaging Review Dg Chest 2 View  10/02/2013   CLINICAL DATA:  Nausea and disorientation  EXAM: CHEST  2 VIEW  COMPARISON:  Prior chest x-ray 10/13/2012  FINDINGS: The lungs are clear and negative for focal airspace consolidation, pulmonary edema or suspicious pulmonary nodule. No pleural effusion or pneumothorax. Cardiac and mediastinal contours are within normal limits. No acute fracture or lytic or blastic osseous lesions. The visualized upper abdominal bowel gas pattern is unremarkable. Left subclavian approach single-lumen port catheter. Catheter tip a projects over the upper SVC.  IMPRESSION: No active cardiopulmonary disease.  Electronically Signed   By: Jacqulynn Cadet M.D.   On: 10/02/2013 14:14     EKG Interpretation   Date/Time:  Saturday October 02 2013 13:02:08 EDT Ventricular Rate:  115 PR Interval:  184 QRS Duration: 106 QT Interval:  322 QTC Calculation: 445 R Axis:   -44 Text Interpretation:  Sinus tachycardia Right atrial enlargement Left axis  deviation Incomplete right bundle branch block -Unchanged Abnormal ECG  Confirmed by Jeneen Rinks  MD, [place] ([id number]) on 10/02/2013 1:44:18 PM      MDM   Final diagnoses:  Nausea  Essential hypertension  Patient presents with palpitations. Heart rate on arrival 115, reports associated nausea. Plan check labs, chest x-ray, fluids ordered. CBC and CMP without concerning abnormalities.  Patient's heart rate has responded to fluids, hr 89. Dr. Jeneen Rinks also evaluated the patient during this encounter. And set the disposition and diagnoses.   Meds given in ED:  Medications  ondansetron (ZOFRAN) injection 4 mg (4 mg Intravenous Given 10/02/13 1502)  sodium chloride 0.9 % bolus 1,000 mL (0 mLs Intravenous Stopped 10/02/13 1611)    Discharge  Medication List as of 10/02/2013  3:56 PM          Ander Purpura Burnetta Sabin, PA-C 10/02/13 1756

## 2013-10-02 NOTE — ED Provider Notes (Signed)
Pt seen and evaluated.  Discussed with PA Harvie Heck.  I agree with assessment.  Tachycardia resolves with IV fluids.  Nausea resolved.  Currently asymptomatic.  Pt complains of nausea with Carvediolol for over 6 months.  Advised to stop med and record BPs for PCP to review.  Pt requests cardiology referral re: BP meds.  Given.  Tanna Furry, MD 10/02/13 [phone]

## 2013-10-02 NOTE — ED Notes (Signed)
Pt presents to department for evaluation of nausea, "racing" heart and hypertension. States x3 episodes of symptoms this week. Denies chest pain at the time. States she feels nauseated. She is alert and oriented x4.

## 2013-10-07 NOTE — ED Provider Notes (Signed)
Pt seen and examined. Complains of nausea, and palpitations.  Sinus tach noted.  Noconcern for PE, MI, Pneumonia.  EKG withoutischemic changes.  CXR normal.  Not hypoxemic.  Pt sx resolve after tx for najusea.  Denies vertigo. No nystagmus.  No chest pain.  Agree with workup and disposition.  Tanna Furry, MD 10/07/13 [phone]

## 2013-11-05 ENCOUNTER — Encounter: Payer: Medicare Other | Admitting: Cardiology

## 2014-03-29 ENCOUNTER — Other Ambulatory Visit: Payer: Self-pay

## 2014-03-29 DIAGNOSIS — Z1231 Encounter for screening mammogram for malignant neoplasm of breast: Secondary | ICD-10-CM

## 2014-04-05 ENCOUNTER — Ambulatory Visit
Admission: RE | Admit: 2014-04-05 | Discharge: 2014-04-05 | Disposition: A | Discharge status: Home / Self Care | Payer: Medicare Other | Source: Ambulatory Visit

## 2014-04-05 ENCOUNTER — Encounter (INDEPENDENT_AMBULATORY_CARE_PROVIDER_SITE_OTHER): Payer: Self-pay

## 2014-04-05 DIAGNOSIS — Z1231 Encounter for screening mammogram for malignant neoplasm of breast: Secondary | ICD-10-CM

## 2014-06-23 ENCOUNTER — Other Ambulatory Visit: Payer: Self-pay | Admitting: Family Medicine

## 2014-06-23 DIAGNOSIS — E2839 Other primary ovarian failure: Secondary | ICD-10-CM

## 2014-06-30 ENCOUNTER — Ambulatory Visit
Admission: RE | Admit: 2014-06-30 | Discharge: 2014-06-30 | Disposition: A | Discharge status: Home / Self Care | Payer: Medicare Other | Source: Ambulatory Visit | Attending: Family Medicine | Admitting: Family Medicine

## 2014-06-30 DIAGNOSIS — E2839 Other primary ovarian failure: Secondary | ICD-10-CM

## 2014-11-03 ENCOUNTER — Encounter (HOSPITAL_COMMUNITY): Payer: Self-pay | Admitting: General Practice

## 2014-11-03 ENCOUNTER — Emergency Department (HOSPITAL_COMMUNITY)
Admission: EM | Admit: 2014-11-03 | Discharge: 2014-11-03 | Disposition: A | Discharge status: Home / Self Care | Payer: Medicare Other | Attending: Emergency Medicine | Admitting: Emergency Medicine

## 2014-11-03 ENCOUNTER — Emergency Department (HOSPITAL_COMMUNITY): Payer: Medicare Other

## 2014-11-03 DIAGNOSIS — Z87891 Personal history of nicotine dependence: Secondary | ICD-10-CM | POA: Insufficient documentation

## 2014-11-03 DIAGNOSIS — M5030 Other cervical disc degeneration, unspecified cervical region: Secondary | ICD-10-CM | POA: Diagnosis not present

## 2014-11-03 DIAGNOSIS — I10 Essential (primary) hypertension: Secondary | ICD-10-CM | POA: Insufficient documentation

## 2014-11-03 DIAGNOSIS — Z79899 Other long term (current) drug therapy: Secondary | ICD-10-CM | POA: Insufficient documentation

## 2014-11-03 DIAGNOSIS — E039 Hypothyroidism, unspecified: Secondary | ICD-10-CM | POA: Diagnosis not present

## 2014-11-03 DIAGNOSIS — M1990 Unspecified osteoarthritis, unspecified site: Secondary | ICD-10-CM | POA: Insufficient documentation

## 2014-11-03 DIAGNOSIS — Z853 Personal history of malignant neoplasm of breast: Secondary | ICD-10-CM | POA: Diagnosis not present

## 2014-11-03 DIAGNOSIS — Z7982 Long term (current) use of aspirin: Secondary | ICD-10-CM | POA: Diagnosis not present

## 2014-11-03 DIAGNOSIS — M5410 Radiculopathy, site unspecified: Secondary | ICD-10-CM | POA: Diagnosis not present

## 2014-11-03 DIAGNOSIS — R200 Anesthesia of skin: Secondary | ICD-10-CM | POA: Diagnosis present

## 2014-11-03 DIAGNOSIS — M47812 Spondylosis without myelopathy or radiculopathy, cervical region: Secondary | ICD-10-CM

## 2014-11-03 LAB — BASIC METABOLIC PANEL
ANION GAP: 8 (ref 5–15)
BUN: 13 mg/dL (ref 6–20)
CO2: 26 mmol/L (ref 22–32)
Calcium: 9.2 mg/dL (ref 8.9–10.3)
Chloride: 103 mmol/L (ref 101–111)
Creatinine, Ser: 0.7 mg/dL (ref 0.44–1.00)
GLUCOSE: 95 mg/dL (ref 65–99)
POTASSIUM: 3.8 mmol/L (ref 3.5–5.1)
Sodium: 137 mmol/L (ref 135–145)

## 2014-11-03 LAB — CBC WITH DIFFERENTIAL/PLATELET
BASOS ABS: 0 10*3/uL (ref 0.0–0.1)
BASOS PCT: 0 % (ref 0–1)
EOS PCT: 0 % (ref 0–5)
Eosinophils Absolute: 0 10*3/uL (ref 0.0–0.7)
HCT: 39.7 % (ref 36.0–46.0)
Hemoglobin: 13.6 g/dL (ref 12.0–15.0)
LYMPHS PCT: 32 % (ref 12–46)
Lymphs Abs: 1.7 10*3/uL (ref 0.7–4.0)
MCH: 33.2 pg (ref 26.0–34.0)
MCHC: 34.3 g/dL (ref 30.0–36.0)
MCV: 96.8 fL (ref 78.0–100.0)
Monocytes Absolute: 0.3 10*3/uL (ref 0.1–1.0)
Monocytes Relative: 5 % (ref 3–12)
Neutro Abs: 3.4 10*3/uL (ref 1.7–7.7)
Neutrophils Relative %: 63 % (ref 43–77)
PLATELETS: 205 10*3/uL (ref 150–400)
RBC: 4.1 MIL/uL (ref 3.87–5.11)
RDW: 12.4 % (ref 11.5–15.5)
WBC: 5.4 10*3/uL (ref 4.0–10.5)

## 2014-11-03 MED ORDER — NAPROXEN 500 MG PO TABS: 500.0000 mg | ORAL_TABLET | Freq: Two times a day (BID) | ORAL | Status: DC

## 2014-11-03 NOTE — ED Provider Notes (Signed)
CSN: [id number]     Arrival date & time 11/03/14  1135 History   First MD Initiated Contact with Patient 11/03/14 1147     Chief Complaint  Patient presents with  . Numbness     (Consider location/radiation/quality/duration/timing/severity/associated sxs/prior Treatment) HPI Comments: Patient is a 69 year old female with a past medical history of hypertension and hypothyroidism who presents with left hand paresthesias that started 2 days ago when she woke up. Symptoms started gradually and progressively traveled up her left arm. She denies any pain or weakness in the arm. No injury. She reports some associated left neck pain and is concerned she may have slept on her pillow wrong. No aggravating/alleviating factors. No other associated symptoms. She denies any slurred speech, facial droop, fever, limited neck movement.    Past Medical History  Diagnosis Date  . Hypertension   . Hypothyroidism   . Breast cancer 2005    right lumpectomy, chemotherapy with A/C combo  & Taxol, mammogram 02/06/11 without recurrence  . Arthritis     hands and lower back  . Dyspnea     Echocardiogram 02/20/11: EF 60-65%.;  ETT-myoveiw 12/12 normal with EF 80%   Past Surgical History  Procedure Laterality Date  . Appendectomy  1955  . Breast lumpectomy  2005    right  . Tonsillectomy      "as a child"   Family History  Problem Relation Age of Onset  . Coronary artery disease Brother 71    died of first MI at 71  . Coronary artery disease Father     had juvenile DM most of his life, had 3 MI's the first in his early 23's, died at 27  . Diabetes Father    Social History  Substance Use Topics  . Smoking status: Former Smoker -- 1.00 packs/day for 5 years    Types: Cigarettes  . Smokeless tobacco: Never Used  . Alcohol Use: 4.2 oz/week    7 Glasses of wine per week   OB History    No data available     Review of Systems  Constitutional: Negative for fever, chills and fatigue.  HENT: Negative  for trouble swallowing.   Eyes: Negative for visual disturbance.  Respiratory: Negative for shortness of breath.   Cardiovascular: Negative for chest pain and palpitations.  Gastrointestinal: Negative for nausea, vomiting, abdominal pain and diarrhea.  Genitourinary: Negative for dysuria and difficulty urinating.  Musculoskeletal: Negative for arthralgias and neck pain.  Skin: Negative for color change.  Neurological: Positive for numbness. Negative for dizziness and weakness.  Psychiatric/Behavioral: Negative for dysphoric mood.      Allergies  Hctz and Other  Home Medications   Prior to Admission medications   Medication Sig Start Date End Date Taking? Authorizing Provider  Ascorbic Acid (VITAMIN C PO) Take 1 tablet by mouth daily.    Historical Provider, MD  aspirin EC 81 MG tablet Take 81 mg by mouth daily.     Historical Provider, MD  Calcium Carbonate-Vitamin D (CALTRATE 600+D PO) Take 1 tablet by mouth daily.     Historical Provider, MD  carvedilol (COREG) 6.25 MG tablet Take 3.125 mg by mouth 2 (two) times daily with a meal.    Historical Provider, MD  cholecalciferol (VITAMIN D) 1000 UNITS tablet Take 1,000 Units by mouth daily.    Historical Provider, MD  Flaxseed, Linseed, (FLAXSEED OIL PO) Take 2 capsules by mouth daily.    Historical Provider, MD  GARLIC PO Take 2 tablets  by mouth daily.     Historical Provider, MD  levothyroxine (SYNTHROID, LEVOTHROID) 50 MCG tablet Take 50 mcg by mouth daily.     Historical Provider, MD  lisinopril (PRINIVIL,ZESTRIL) 40 MG tablet Take 1 tablet (40 mg total) by mouth daily. 10/14/12   Janece Canterbury, MD  Multiple Vitamins-Minerals (MULTIVITAMINS THER. W/MINERALS) TABS Take 1 tablet by mouth daily.      Historical Provider, MD  TURMERIC PO Take 1 capsule by mouth daily.    Historical Provider, MD   BP 167/91 mmHg  Pulse 93  Temp(Src) 98 F (36.7 C) (Oral)  Resp 14  Ht 5\' 3"  (1.6 m)  Wt 146 lb (66.225 kg)  BMI 25.87 kg/m2  SpO2  96%  LMP 02/19/2011 Physical Exam  Constitutional: She is oriented to person, place, and time. She appears well-developed and well-nourished. No distress.  HENT:  Head: Normocephalic and atraumatic.  Eyes: Conjunctivae and EOM are normal. Pupils are equal, round, and reactive to light.  Neck: Normal range of motion.  Cardiovascular: Normal rate and regular rhythm.  Exam reveals no gallop and no friction rub.   No murmur heard. Pulmonary/Chest: Effort normal and breath sounds normal. She has no wheezes. She has no rales. She exhibits no tenderness.  Abdominal: Soft. She exhibits no distension. There is no tenderness. There is no rebound.  Musculoskeletal: Normal range of motion.  Neurological: She is alert and oriented to person, place, and time. Coordination normal.  Bilateral extremity strength and sensation equal and intact bilaterally. Right arm diminished sensation to sharp touch over radial aspect of left arm and fingers. Proximal arm near the deltoid with sufficient sensation. Speech is goal-oriented. Moves limbs without ataxia.   Skin: Skin is warm and dry.  Psychiatric: She has a normal mood and affect. Her behavior is normal.  Nursing note and vitals reviewed.   ED Course  Procedures (including critical care time) Labs Review Labs Reviewed  CBC WITH DIFFERENTIAL/PLATELET  BASIC METABOLIC PANEL    Imaging Review Ct Head Wo Contrast  11/03/2014   CLINICAL DATA:  Left arm and hand numbness. History of breast cancer.  EXAM: CT HEAD WITHOUT CONTRAST  CT CERVICAL SPINE WITHOUT CONTRAST  TECHNIQUE: Multidetector CT imaging of the head and cervical spine was performed following the standard protocol without intravenous contrast. Multiplanar CT image reconstructions of the cervical spine were also generated.  COMPARISON:  10/13/2012  FINDINGS: CT HEAD FINDINGS  The brainstem, cerebellum, cerebral peduncles, thalamus, basal ganglia, basilar cisterns, and ventricular system appear within  normal limits. Periventricular white matter and corona radiata hypodensities favor chronic ischemic microvascular white matter disease. No intracranial hemorrhage, mass lesion, or acute CVA.  CT CERVICAL SPINE FINDINGS  No cervical spine fracture or subluxation is identified.  Intervertebral and uncinate spurring cause osseous foraminal stenosis on the right at C3-4, C4-5, C5-6, and C6-7 ; and borderline osseous foraminal narrowing on the left at C3-4, C5-6, C6-7, and C7-T1. Suspected degenerative disc disease at the C6-7 level potentially causing mild central narrowing of the thecal sac.  IMPRESSION: 1. Cervical spondylosis and degenerative disc disease, causing right greater than left multilevel foraminal narrowing. Possible mild central narrowing of the thecal sac at C6-7. 2. No acute intracranial findings. Periventricular white matter and corona radiata hypodensities favor chronic ischemic microvascular white matter disease.   Electronically Signed   By: Van Clines M.D.   On: 11/03/2014 13:15   Ct Cervical Spine Wo Contrast  11/03/2014   CLINICAL DATA:  Left arm and  hand numbness. History of breast cancer.  EXAM: CT HEAD WITHOUT CONTRAST  CT CERVICAL SPINE WITHOUT CONTRAST  TECHNIQUE: Multidetector CT imaging of the head and cervical spine was performed following the standard protocol without intravenous contrast. Multiplanar CT image reconstructions of the cervical spine were also generated.  COMPARISON:  10/13/2012  FINDINGS: CT HEAD FINDINGS  The brainstem, cerebellum, cerebral peduncles, thalamus, basal ganglia, basilar cisterns, and ventricular system appear within normal limits. Periventricular white matter and corona radiata hypodensities favor chronic ischemic microvascular white matter disease. No intracranial hemorrhage, mass lesion, or acute CVA.  CT CERVICAL SPINE FINDINGS  No cervical spine fracture or subluxation is identified.  Intervertebral and uncinate spurring cause osseous foraminal  stenosis on the right at C3-4, C4-5, C5-6, and C6-7 ; and borderline osseous foraminal narrowing on the left at C3-4, C5-6, C6-7, and C7-T1. Suspected degenerative disc disease at the C6-7 level potentially causing mild central narrowing of the thecal sac.  IMPRESSION: 1. Cervical spondylosis and degenerative disc disease, causing right greater than left multilevel foraminal narrowing. Possible mild central narrowing of the thecal sac at C6-7. 2. No acute intracranial findings. Periventricular white matter and corona radiata hypodensities favor chronic ischemic microvascular white matter disease.   Electronically Signed   By: Van Clines M.D.   On: 11/03/2014 13:15   I have personally reviewed and evaluated these images and lab results as part of my medical decision-making.   EKG Interpretation   Date/Time:  Thursday November 03 2014 11:59:03 EDT Ventricular Rate:  92 PR Interval:  146 QRS Duration: 110 QT Interval:  386 QTC Calculation: 477 R Axis:   -8 Text Interpretation:  Sinus rhythm Incomplete RBBB and LAFB Baseline  wander in lead(s) II III aVF Nonspecific T wave abnormality No significant  change since last tracing Confirmed by Ashok Cordia  MD, Lennette Bihari ([id number]) on  11/03/2014 12:27:52 PM      MDM   Final diagnoses:  Cervical spine degeneration  Radicular neuropathy    12:29 PM CT head and cervical spine pending. Vitals stable and patient afebrile. Labs pending.   2:34 PM Labs unremarkable for acute changes. CT cervical spine shows DDD and mild central narrowing at the thecal sac at C6-C7, likely contributing to patient's symptoms in a dermatomal pattern. Patient chooses not to have prednisone for inflammation. Patient will have naprosyn for inflammation. Patient advised to follow up with PCP. I doubt stroke at this time and patient has few risk factors for ischemic event.   Alvina Chou, PA-C 11/03/14 [place], MD 11/04/14 [phone]

## 2014-11-03 NOTE — ED Notes (Signed)
Patient transported to X-ray 

## 2014-11-03 NOTE — Discharge Instructions (Signed)
Take naprosyn as directed for numbness. Refer to attached documents for more information. Follow up with your doctor and return to the ED with worsening or concerning symptoms.

## 2014-11-03 NOTE — ED Notes (Signed)
Pt was seen at urgent care and was told to come to the ED. Pt has been experiencing some left arm and hand numbness. Pt is A/O. Pt reporting numbness in the arm started today, but hand numbness for a couple days.

## 2015-04-23 ENCOUNTER — Encounter (HOSPITAL_COMMUNITY): Payer: Self-pay | Admitting: Nurse Practitioner

## 2015-04-23 ENCOUNTER — Emergency Department (HOSPITAL_COMMUNITY)
Admission: EM | Admit: 2015-04-23 | Discharge: 2015-04-23 | Disposition: A | Discharge status: Home / Self Care | Payer: Medicare Other | Attending: Emergency Medicine | Admitting: Emergency Medicine

## 2015-04-23 DIAGNOSIS — R110 Nausea: Secondary | ICD-10-CM | POA: Insufficient documentation

## 2015-04-23 DIAGNOSIS — R42 Dizziness and giddiness: Secondary | ICD-10-CM | POA: Insufficient documentation

## 2015-04-23 DIAGNOSIS — Z79899 Other long term (current) drug therapy: Secondary | ICD-10-CM | POA: Diagnosis not present

## 2015-04-23 DIAGNOSIS — E039 Hypothyroidism, unspecified: Secondary | ICD-10-CM | POA: Diagnosis not present

## 2015-04-23 DIAGNOSIS — Z853 Personal history of malignant neoplasm of breast: Secondary | ICD-10-CM | POA: Diagnosis not present

## 2015-04-23 DIAGNOSIS — Z87891 Personal history of nicotine dependence: Secondary | ICD-10-CM | POA: Insufficient documentation

## 2015-04-23 DIAGNOSIS — Z7982 Long term (current) use of aspirin: Secondary | ICD-10-CM | POA: Insufficient documentation

## 2015-04-23 DIAGNOSIS — R000 Tachycardia, unspecified: Secondary | ICD-10-CM | POA: Diagnosis present

## 2015-04-23 DIAGNOSIS — I10 Essential (primary) hypertension: Secondary | ICD-10-CM | POA: Diagnosis not present

## 2015-04-23 DIAGNOSIS — R002 Palpitations: Secondary | ICD-10-CM | POA: Insufficient documentation

## 2015-04-23 LAB — CBC
HEMATOCRIT: 42.7 % (ref 36.0–46.0)
Hemoglobin: 14.8 g/dL (ref 12.0–15.0)
MCH: 33 pg (ref 26.0–34.0)
MCHC: 34.7 g/dL (ref 30.0–36.0)
MCV: 95.1 fL (ref 78.0–100.0)
Platelets: 232 10*3/uL (ref 150–400)
RBC: 4.49 MIL/uL (ref 3.87–5.11)
RDW: 12.2 % (ref 11.5–15.5)
WBC: 6.2 10*3/uL (ref 4.0–10.5)

## 2015-04-23 LAB — BASIC METABOLIC PANEL
ANION GAP: 14 (ref 5–15)
BUN: 16 mg/dL (ref 6–20)
CALCIUM: 9.4 mg/dL (ref 8.9–10.3)
CO2: 22 mmol/L (ref 22–32)
Chloride: 94 mmol/L — ABNORMAL LOW (ref 101–111)
Creatinine, Ser: 0.71 mg/dL (ref 0.44–1.00)
GFR calc Af Amer: >60 mL/min (ref >60)
GLUCOSE: 113 mg/dL — AB (ref 65–99)
POTASSIUM: 4 mmol/L (ref 3.5–5.1)
Sodium: 130 mmol/L — ABNORMAL LOW (ref 135–145)

## 2015-04-23 LAB — I-STAT TROPONIN, ED: Troponin i, poc: 0.03 ng/mL (ref 0.00–0.08)

## 2015-04-23 NOTE — ED Provider Notes (Signed)
CSN: [id number]     Arrival date & time 04/23/15  1751 History  By signing my name below, I, Madison Nunez, attest that this documentation has been prepared under the direction and in the presence of Madison Speak, MD.   Electronically Signed: Nicole Nunez, ED Scribe. 04/23/2015. 11:17 PM      Chief Complaint  Patient presents with  . Tachycardia     The history is provided by the patient. No language interpreter was used.   HPI Comments: Madison Nunez is a 70 y.o. female with PMHx of HTN and hypothyroidism who presents to the Emergency Department complaining of gradual onset palpitations, onset earlier tonight. Pt states that she has not felt normal for two weeks and also reports on-going nausea, dizziness, hair loss, and feels as if her equilibrium is off. Pt was recently switched to a calcium channel blocker after seeing her PCP about a week ago because she felt as if the beta blocker she was on was contributing to her symptoms. Pt reports that three days ago she began experiencing similar symptoms on her new medication, prompting her to come to the ED. No worsening or alleviating factors noted. Pt denies diarrhea, sleeplessness, abdominal pain, or any other pertinent symptoms. She is scheduled to follow up with her PCP in one month.     Past Medical History  Diagnosis Date  . Hypertension   . Hypothyroidism   . Breast cancer [facility]) 2005    right lumpectomy, chemotherapy with A/C combo  & Taxol, mammogram 02/06/11 without recurrence  . Arthritis     hands and lower back  . Dyspnea     Echocardiogram 02/20/11: EF 60-65%.;  ETT-myoveiw 12/12 normal with EF 80%   Past Surgical History  Procedure Laterality Date  . Appendectomy  1955  . Breast lumpectomy  2005    right  . Tonsillectomy      "as a child"   Family History  Problem Relation Age of Onset  . Coronary artery disease Brother 41    died of first MI at 42  . Coronary artery disease Father     had juvenile DM  most of his life, had 3 MI's the first in his early 53's, died at 16  . Diabetes Father    Social History  Substance Use Topics  . Smoking status: Former Smoker -- 1.00 packs/day for 5 years    Types: Cigarettes  . Smokeless tobacco: Never Used  . Alcohol Use: 4.2 oz/week    7 Glasses of wine per week   OB History    No data available     Review of Systems  Cardiovascular: Positive for palpitations.  Gastrointestinal: Positive for nausea. Negative for abdominal pain and diarrhea.  Neurological: Positive for dizziness.  All other systems reviewed and are negative.   Allergies  Hctz; Beta adrenergic blockers; and Prednisone  Home Medications   Prior to Admission medications   Medication Sig Start Date End Date Taking? Authorizing Provider  amLODipine (NORVASC) 5 MG tablet Take 5 mg by mouth at bedtime.   Yes Historical Provider, MD  Ascorbic Acid (VITAMIN C PO) Take 1 tablet by mouth daily.    Historical Provider, MD  aspirin EC 81 MG tablet Take 81 mg by mouth daily.     Historical Provider, MD  Calcium Carbonate-Vitamin D (CALTRATE 600+D PO) Take 1 tablet by mouth daily.     Historical Provider, MD  carvedilol (COREG) 6.25 MG tablet Take 3.125 mg by mouth 2 (  two) times daily with a meal.    Historical Provider, MD  cholecalciferol (VITAMIN D) 1000 UNITS tablet Take 1,000 Units by mouth daily.    Historical Provider, MD  Flaxseed, Linseed, (FLAXSEED OIL PO) Take 2 capsules by mouth daily.    Historical Provider, MD  GARLIC PO Take 2 tablets by mouth daily.     Historical Provider, MD  levothyroxine (SYNTHROID, LEVOTHROID) 50 MCG tablet Take 50 mcg by mouth daily.     Historical Provider, MD  lisinopril (PRINIVIL,ZESTRIL) 40 MG tablet Take 1 tablet (40 mg total) by mouth daily. 10/14/12   Janece Canterbury, MD  Multiple Vitamins-Minerals (MULTIVITAMINS THER. W/MINERALS) TABS Take 1 tablet by mouth daily.      Historical Provider, MD  naproxen (NAPROSYN) 500 MG tablet Take 1 tablet  (500 mg total) by mouth 2 (two) times daily with a meal. 11/03/14   Alvina Chou, PA-C  TURMERIC PO Take 1 capsule by mouth daily.    Historical Provider, MD   BP 164/94 mmHg  Pulse 99  Temp(Src) 98.5 F (36.9 C) (Oral)  Resp 14  Ht 5\' 4"  (1.626 m)  Wt 160 lb 4.8 oz (72.712 kg)  BMI 27.50 kg/m2  SpO2 98%  LMP 02/19/2011 Physical Exam  Constitutional: She is oriented to person, place, and time. She appears well-developed and well-nourished. No distress.  HENT:  Head: Normocephalic and atraumatic.  Eyes: EOM are normal.  Neck: Normal range of motion.  Cardiovascular: Normal rate, regular rhythm and normal heart sounds.   No murmur heard. Pulmonary/Chest: Effort normal and breath sounds normal. No respiratory distress. She has no wheezes. She has no rales.  Abdominal: Soft. She exhibits no distension. There is no tenderness.  Musculoskeletal: Normal range of motion. She exhibits no edema or tenderness.  Neurological: She is alert and oriented to person, place, and time.  Skin: Skin is warm and dry.  Psychiatric: She has a normal mood and affect. Judgment normal.  Nursing note and vitals reviewed.   ED Course  Procedures (including critical care time) DIAGNOSTIC STUDIES: Oxygen Saturation is 98% on RA, normal by my interpretation.    COORDINATION OF CARE: 11:09 PM-Discussed treatment plan which includes EKG, monitoring symptoms, and follow up with PCP with pt at bedside and pt agreed to plan.   Labs Review Labs Reviewed  BASIC METABOLIC PANEL - Abnormal; Notable for the following:    Sodium 130 (*)    Chloride 94 (*)    Glucose, Bld 113 (*)    All other components within normal limits  CBC  I-STAT TROPOININ, ED    Imaging Review No results found. I have personally reviewed and evaluated these images and lab results as part of my medical decision-making.   EKG Interpretation   Date/Time:  Sunday April 23 2015 18:30:01 EST Ventricular Rate:  121 PR Interval:   152 QRS Duration: 98 QT Interval:  316 QTC Calculation: 448 R Axis:   -39 Text Interpretation:  Sinus tachycardia Possible Left atrial enlargement  Left axis deviation Pulmonary disease pattern Incomplete right bundle  branch block Left ventricular hypertrophy Nonspecific T wave abnormality  Abnormal ECG No significant change was found Confirmed by CAMPOS  MD,  Lennette Bihari ([id number]) on 04/23/2015 7:26:55 PM      MDM   Final diagnoses:  None    Patient presents with possible reaction to her medication. She is experiencing palpitations but her ekg is normal and she has had no ectopy throughout her ED stay.  The workup is  unremarkable and she appears stable.   To continue her medications and follow up with pcp.  I personally performed the services described in this documentation, which was scribed in my presence. The recorded information has been reviewed and is accurate.     Madison Speak, MD 04/30/15 [phone]

## 2015-04-23 NOTE — Discharge Instructions (Signed)
Continue your medications as prescribed.  Continue to keep a record of your blood pressure and heart rate and take this with you to your next doctor's appointment, ideally within the next week.  Return to the ER if symptoms significantly worsen or change.   Palpitations A palpitation is the feeling that your heartbeat is irregular or is faster than normal. It may feel like your heart is fluttering or skipping a beat. Palpitations are usually not a serious problem. However, in some cases, you may need further medical evaluation. CAUSES  Palpitations can be caused by:  Smoking.  Caffeine or other stimulants, such as diet pills or energy drinks.  Alcohol.  Stress and anxiety.  Strenuous physical activity.  Fatigue.  Certain medicines.  Heart disease, especially if you have a history of irregular heart rhythms (arrhythmias), such as atrial fibrillation, atrial flutter, or supraventricular tachycardia.  An improperly working pacemaker or defibrillator. DIAGNOSIS  To find the cause of your palpitations, your health care provider will take your medical history and perform a physical exam. Your health care provider may also have you take a test called an ambulatory electrocardiogram (ECG). An ECG records your heartbeat patterns over a 24-hour period. You may also have other tests, such as:  Transthoracic echocardiogram (TTE). During echocardiography, sound waves are used to evaluate how blood flows through your heart.  Transesophageal echocardiogram (TEE).  Cardiac monitoring. This allows your health care provider to monitor your heart rate and rhythm in real time.  Holter monitor. This is a portable device that records your heartbeat and can help diagnose heart arrhythmias. It allows your health care provider to track your heart activity for several days, if needed.  Stress tests by exercise or by giving medicine that makes the heart beat faster. TREATMENT  Treatment of  palpitations depends on the cause of your symptoms and can vary greatly. Most cases of palpitations do not require any treatment other than time, relaxation, and monitoring your symptoms. Other causes, such as atrial fibrillation, atrial flutter, or supraventricular tachycardia, usually require further treatment. HOME CARE INSTRUCTIONS   Avoid:  Caffeinated coffee, tea, soft drinks, diet pills, and energy drinks.  Chocolate.  Alcohol.  Stop smoking if you smoke.  Reduce your stress and anxiety. Things that can help you relax include:  A method of controlling things in your body, such as your heartbeats, with your mind (biofeedback).  Yoga.  Meditation.  Physical activity such as swimming, jogging, or walking.  Get plenty of rest and sleep. SEEK MEDICAL CARE IF:   You continue to have a fast or irregular heartbeat beyond 24 hours.  Your palpitations occur more often. SEEK IMMEDIATE MEDICAL CARE IF:  You have chest pain or shortness of breath.  You have a severe headache.  You feel dizzy or you faint. MAKE SURE YOU:  Understand these instructions.  Will watch your condition.  Will get help right away if you are not doing well or get worse.   This information is not intended to replace advice given to you by your health care provider. Make sure you discuss any questions you have with your health care provider.   Document Released: 02/16/2000 Document Revised: 02/23/2013 Document Reviewed: 04/19/2011 Elsevier Interactive Patient Education [employer].

## 2015-04-23 NOTE — ED Provider Notes (Signed)
MSE was initiated and I personally evaluated the patient and placed orders (if any) at  10:45 PM on April 23, 2015.  The patient appears stable so that the remainder of the MSE may be completed by another provider.  Feels palpitations now.  May have possible flutter waves noted. No SOB or CP.   Jola Schmidt, MD 04/23/15 2245

## 2015-04-23 NOTE — ED Notes (Signed)
She reports sudden onset of feeling a "rushing flushed" sensation over her entire body followed by pounding sensation in heart this afternoon. She checked and her BP was elevated. She was taken off her beta blocker and started on a calcium channel blocker Thursday and has had normal BP readings since. She is A&Ox4, resp e/u

## 2016-07-31 ENCOUNTER — Other Ambulatory Visit: Payer: Self-pay | Admitting: Family Medicine

## 2016-07-31 DIAGNOSIS — Z1231 Encounter for screening mammogram for malignant neoplasm of breast: Secondary | ICD-10-CM

## 2016-08-14 ENCOUNTER — Ambulatory Visit
Admission: RE | Admit: 2016-08-14 | Discharge: 2016-08-14 | Disposition: A | Discharge status: Home / Self Care | Payer: Medicare Other | Source: Ambulatory Visit | Attending: Family Medicine | Admitting: Family Medicine

## 2016-08-14 DIAGNOSIS — Z1231 Encounter for screening mammogram for malignant neoplasm of breast: Secondary | ICD-10-CM

## 2016-08-14 HISTORY — DX: Personal history of antineoplastic chemotherapy: Z92.21

## 2016-08-14 HISTORY — DX: Personal history of irradiation: Z92.3

## 2016-11-25 ENCOUNTER — Ambulatory Visit (HOSPITAL_COMMUNITY)
Admission: EM | Admit: 2016-11-25 | Discharge: 2016-11-25 | Disposition: A | Discharge status: Home / Self Care | Payer: Medicare Other | Attending: Family Medicine | Admitting: Family Medicine

## 2016-11-25 ENCOUNTER — Encounter (HOSPITAL_COMMUNITY): Payer: Self-pay | Admitting: *Deleted

## 2016-11-25 DIAGNOSIS — R42 Dizziness and giddiness: Secondary | ICD-10-CM | POA: Diagnosis not present

## 2016-11-25 DIAGNOSIS — H8110 Benign paroxysmal vertigo, unspecified ear: Secondary | ICD-10-CM

## 2016-11-25 DIAGNOSIS — I10 Essential (primary) hypertension: Secondary | ICD-10-CM | POA: Diagnosis not present

## 2016-11-25 MED ORDER — MECLIZINE HCL 25 MG PO TABS: ORAL_TABLET | ORAL | 0 refills | Status: DC

## 2016-11-25 NOTE — Discharge Instructions (Signed)
Call your primary care provider for an appointment soon to have your medications adjusted and evaluated. Take the meclizine as needed for vertigo and dizziness. Move slowly. Return your head slowly. For vomiting, severe headache, problems with vision, speech, hearing, swallowing, problems with weakness or numbness on one side of the body go to the emergency department. Same for development of chest pain or shortness of breath.

## 2016-11-25 NOTE — ED Triage Notes (Signed)
[  pt  Reports   Symptoms  Of  dizzyness   Some  Nausea   And  Twinges  On l  Side  Face  For  Several  Days   Pt   Denies   Any  Frank  Chest  Pain or  Shortness  Of  Breath  She  Is   Sitting  Upright on the  Exam table  Speaking in  Complete  sentnces   And  Is  In no  Acute   Severe  Distress   She  Ambulated  To room with steady  Fluid  Gait

## 2016-11-25 NOTE — ED Provider Notes (Signed)
Madison Nunez    CSN: [id number] Arrival date & time: 11/25/16  1012     History   Chief Complaint Chief Complaint  Patient presents with  . Cough  . Dizziness    HPI Madison Nunez is a 71 y.o. female.   71 year old female with history of hypertension, hypothyroidism, breast cancer and tachycardia persists in the urgent care after having 2 episodes of light nausea, lightheadedness and shakiness it would last for 3-4 hours. Once occurred 3 days ago Friday morning and then again today this morning about 5:30. Similar symptoms. She states she had a history of vertigo with the room spinning in the past and this was very similar to that specific episode or vertigo. Denies chest pain, heaviness, tightness, fullness or pressure. Denies shortness of breath or cough. She has been changing her medications around since the first of this year tapering them down, and restarting them. She attributes her symptoms to a subjective feeling of an increase in blood pressure on the not measured at home.      Past Medical History:  Diagnosis Date  . Arthritis    hands and lower back  . Breast cancer [facility]) 2005   right lumpectomy, chemotherapy with A/C combo  & Taxol, mammogram 02/06/11 without recurrence  . Dyspnea    Echocardiogram 02/20/11: EF 60-65%.;  ETT-myoveiw 12/12 normal with EF 80%  . Hypertension   . Hypothyroidism   . Personal history of chemotherapy 10/2003  . Personal history of radiation therapy 03/2004    Patient Active Problem List   Diagnosis Date Noted  . Hyponatremia 10/13/2012  . Acute upper respiratory infections of unspecified site 10/13/2012  . Dizziness 03/07/2011  . Shortness of breath 02/19/2011  . Nausea alone 02/19/2011  . HTN (hypertension) 02/19/2011  . Hypothyroidism 02/19/2011    Past Surgical History:  Procedure Laterality Date  . APPENDECTOMY  1955  . BREAST BIOPSY  08/29/2003   malignant  . BREAST LUMPECTOMY  2005   right  .  TONSILLECTOMY     "as a child"    OB History    No data available       Home Medications    Prior to Admission medications   Medication Sig Start Date End Date Taking? Authorizing Provider  amLODipine (NORVASC) 5 MG tablet Take 5 mg by mouth every evening.     [provider]  aspirin EC 81 MG tablet Take 81 mg by mouth daily.     [provider]  Calcium Carbonate-Vitamin D (CALTRATE 600+D PO) Take 1 tablet by mouth 2 (two) times daily.     [provider]  cholecalciferol (VITAMIN D) 1000 UNITS tablet Take 1,000 Units by mouth daily.    [provider]  Flaxseed, Linseed, (FLAXSEED OIL PO) Take 2 capsules by mouth daily.    [provider]  levothyroxine (SYNTHROID, LEVOTHROID) 50 MCG tablet Take 50 mcg by mouth daily.     [provider]  lisinopril (PRINIVIL,ZESTRIL) 40 MG tablet Take 1 tablet (40 mg total) by mouth daily. Patient taking differently: Take 40 mg by mouth every evening.  10/14/12   Janece Canterbury, MD  meclizine (ANTIVERT) 25 MG tablet Take one half to one tablet every 6-8 hours as needed for dizziness. 11/25/16   Janne Napoleon, NP  Multiple Vitamins-Minerals (MULTIVITAMINS THER. W/MINERALS) TABS Take 1 tablet by mouth daily.      [provider]  naproxen (NAPROSYN) 500 MG tablet Take 1 tablet (500 mg total)  by mouth 2 (two) times daily with a meal. 11/03/14   Szekalski, Kaitlyn, PA-C  TURMERIC PO Take 1 capsule by mouth daily.    [provider]    Family History Family History  Problem Relation Age of Onset  . Coronary artery disease Brother 22       died of first MI at 23  . Coronary artery disease Father        had juvenile DM most of his life, had 3 MI's the first in his early 32's, died at 5  . Diabetes Father   . Breast cancer Maternal Grandmother     Social History Social History  Substance Use Topics  . Smoking status: Former Smoker    Packs/day: 1.00    Years: 5.00    Types:  Cigarettes  . Smokeless tobacco: Never Used  . Alcohol use 4.2 oz/week    7 Glasses of wine per week     Allergies   Hctz [hydrochlorothiazide]; Beta adrenergic blockers; and Prednisone   Review of Systems Review of Systems  Constitutional: Negative.   HENT: Negative.   Eyes: Negative.   Respiratory: Negative.   Cardiovascular: Negative.  Negative for chest pain, palpitations and leg swelling.  Gastrointestinal: Positive for nausea. Negative for abdominal pain and vomiting.  Genitourinary: Negative.   Musculoskeletal: Negative.   Skin: Negative.   Neurological: Positive for light-headedness. Negative for tremors, syncope, facial asymmetry, speech difficulty and headaches.  All other systems reviewed and are negative.    Physical Exam Triage Vital Signs ED Triage Vitals [11/25/16 1151]  Enc Vitals Group     BP (!) 168/97     Pulse Rate (!) 102     Resp 18     Temp 98.6 F (37 C)     Temp Source Oral     SpO2 100 %     Weight      Height      Head Circumference      Peak Flow      Pain Score      Pain Loc      Pain Edu?      Excl. in [place]?    No data found.   Updated Vital Signs BP (!) 168/97 (BP Location: Right Arm)   Pulse (!) 102   Temp 98.6 F (37 C) (Oral)   Resp 18   LMP 02/19/2011   SpO2 100%   Visual Acuity Right Eye Distance:   Left Eye Distance:   Bilateral Distance:    Right Eye Near:   Left Eye Near:    Bilateral Near:     Physical Exam  Constitutional: She is oriented to person, place, and time. She appears well-developed and well-nourished. No distress.  HENT:  Head: Normocephalic and atraumatic.  Mouth/Throat: Oropharynx is clear and moist.  Eyes: Pupils are equal, round, and reactive to light. EOM are normal.  Neck: Normal range of motion. Neck supple.  Cardiovascular: Normal rate, regular rhythm, normal heart sounds and intact distal pulses.   No murmur heard. Pulmonary/Chest: Effort normal and breath sounds normal. No  respiratory distress. She has no wheezes.  Musculoskeletal: Normal range of motion. She exhibits no edema.  Neurological: She is alert and oriented to person, place, and time.  Skin: Skin is warm and dry. Capillary refill takes less than 2 seconds.  Psychiatric: She has a normal mood and affect.  Nursing note and vitals reviewed.    UC Treatments / Results  Labs (all labs ordered  are listed, but only abnormal results are displayed) Labs Reviewed - No data to display  EKG  EKG Interpretation None     ED ECG REPORT   Date: 11/25/2016  Rate: 93  Rhythm: normal sinus rhythm  QRS Axis: left  Intervals: normal  ST/T Wave abnormalities: nonspecific T wave changes  Conduction Disutrbances:right bundle branch block incomplete  Narrative Interpretation:   Old EKG Reviewed: unchanged  I have personally reviewed the EKG tracing and agree with the computerized printout as noted.   Radiology No results found.  Procedures Procedures (including critical care time)  Medications Ordered in UC Medications - No data to display   Initial Impression / Assessment and Plan / UC Course  I have reviewed the triage vital signs and the nursing notes.  Pertinent labs & imaging results that were available during my care of the patient were reviewed by me and considered in my medical decision making (see chart for details).    Call your primary care provider for an appointment soon to have your medications adjusted and evaluated. Take the meclizine as needed for vertigo and dizziness. Move slowly. Return your head slowly. For vomiting, severe headache, problems with vision, speech, hearing, swallowing, problems with weakness or numbness on one side of the body go to the emergency department. Same for development of chest pain or shortness of breath.     Final Clinical Impressions(s) / UC Diagnoses   Final diagnoses:  Benign paroxysmal positional vertigo, unspecified laterality  Benign  essential HTN  Lightheadedness    New Prescriptions New Prescriptions   MECLIZINE (ANTIVERT) 25 MG TABLET    Take one half to one tablet every 6-8 hours as needed for dizziness.     Controlled Substance Prescriptions [place] Controlled Substance Registry consulted? Not Applicable   Janne Napoleon, NP 11/25/16 1226

## 2017-02-02 ENCOUNTER — Emergency Department (HOSPITAL_COMMUNITY): Payer: Medicare Other

## 2017-02-02 ENCOUNTER — Observation Stay (HOSPITAL_COMMUNITY)
Admission: EM | Admit: 2017-02-02 | Discharge: 2017-02-03 | Disposition: A | Discharge status: Home / Self Care | Payer: Medicare Other | Attending: Internal Medicine | Admitting: Internal Medicine

## 2017-02-02 ENCOUNTER — Encounter (HOSPITAL_COMMUNITY): Payer: Self-pay | Admitting: Emergency Medicine

## 2017-02-02 DIAGNOSIS — I209 Angina pectoris, unspecified: Secondary | ICD-10-CM | POA: Diagnosis present

## 2017-02-02 DIAGNOSIS — E785 Hyperlipidemia, unspecified: Secondary | ICD-10-CM | POA: Diagnosis not present

## 2017-02-02 DIAGNOSIS — M79602 Pain in left arm: Secondary | ICD-10-CM | POA: Insufficient documentation

## 2017-02-02 DIAGNOSIS — Z79899 Other long term (current) drug therapy: Secondary | ICD-10-CM | POA: Insufficient documentation

## 2017-02-02 DIAGNOSIS — I4510 Unspecified right bundle-branch block: Secondary | ICD-10-CM | POA: Diagnosis not present

## 2017-02-02 DIAGNOSIS — E039 Hypothyroidism, unspecified: Secondary | ICD-10-CM | POA: Diagnosis not present

## 2017-02-02 DIAGNOSIS — Z7982 Long term (current) use of aspirin: Secondary | ICD-10-CM | POA: Insufficient documentation

## 2017-02-02 DIAGNOSIS — F419 Anxiety disorder, unspecified: Secondary | ICD-10-CM | POA: Insufficient documentation

## 2017-02-02 DIAGNOSIS — R0989 Other specified symptoms and signs involving the circulatory and respiratory systems: Secondary | ICD-10-CM

## 2017-02-02 DIAGNOSIS — M19041 Primary osteoarthritis, right hand: Secondary | ICD-10-CM | POA: Insufficient documentation

## 2017-02-02 DIAGNOSIS — R55 Syncope and collapse: Secondary | ICD-10-CM | POA: Insufficient documentation

## 2017-02-02 DIAGNOSIS — M47816 Spondylosis without myelopathy or radiculopathy, lumbar region: Secondary | ICD-10-CM | POA: Diagnosis not present

## 2017-02-02 DIAGNOSIS — R002 Palpitations: Secondary | ICD-10-CM | POA: Diagnosis not present

## 2017-02-02 DIAGNOSIS — I10 Essential (primary) hypertension: Secondary | ICD-10-CM | POA: Insufficient documentation

## 2017-02-02 DIAGNOSIS — Z87891 Personal history of nicotine dependence: Secondary | ICD-10-CM | POA: Insufficient documentation

## 2017-02-02 DIAGNOSIS — Z888 Allergy status to other drugs, medicaments and biological substances status: Secondary | ICD-10-CM | POA: Diagnosis not present

## 2017-02-02 DIAGNOSIS — M19042 Primary osteoarthritis, left hand: Secondary | ICD-10-CM | POA: Diagnosis not present

## 2017-02-02 DIAGNOSIS — R531 Weakness: Secondary | ICD-10-CM | POA: Diagnosis not present

## 2017-02-02 DIAGNOSIS — R079 Chest pain, unspecified: Secondary | ICD-10-CM | POA: Diagnosis not present

## 2017-02-02 DIAGNOSIS — I2089 Other forms of angina pectoris: Secondary | ICD-10-CM

## 2017-02-02 DIAGNOSIS — I208 Other forms of angina pectoris: Secondary | ICD-10-CM

## 2017-02-02 DIAGNOSIS — Z9221 Personal history of antineoplastic chemotherapy: Secondary | ICD-10-CM | POA: Insufficient documentation

## 2017-02-02 DIAGNOSIS — Z853 Personal history of malignant neoplasm of breast: Secondary | ICD-10-CM | POA: Insufficient documentation

## 2017-02-02 DIAGNOSIS — Z8249 Family history of ischemic heart disease and other diseases of the circulatory system: Secondary | ICD-10-CM | POA: Insufficient documentation

## 2017-02-02 LAB — CBC
HCT: 42.9 % (ref 36.0–46.0)
HEMOGLOBIN: 14.7 g/dL (ref 12.0–15.0)
MCH: 33 pg (ref 26.0–34.0)
MCHC: 34.3 g/dL (ref 30.0–36.0)
MCV: 96.4 fL (ref 78.0–100.0)
PLATELETS: 244 10*3/uL (ref 150–400)
RBC: 4.45 MIL/uL (ref 3.87–5.11)
RDW: 12.6 % (ref 11.5–15.5)
WBC: 5.3 10*3/uL (ref 4.0–10.5)

## 2017-02-02 LAB — BASIC METABOLIC PANEL
ANION GAP: 12 (ref 5–15)
BUN: 13 mg/dL (ref 6–20)
CALCIUM: 9.3 mg/dL (ref 8.9–10.3)
CO2: 20 mmol/L — ABNORMAL LOW (ref 22–32)
CREATININE: 0.71 mg/dL (ref 0.44–1.00)
Chloride: 104 mmol/L (ref 101–111)
GFR calc non Af Amer: >60 mL/min (ref >60)
Glucose, Bld: 104 mg/dL — ABNORMAL HIGH (ref 65–99)
Potassium: 4 mmol/L (ref 3.5–5.1)
SODIUM: 136 mmol/L (ref 135–145)

## 2017-02-02 LAB — I-STAT TROPONIN, ED: TROPONIN I, POC: 0 ng/mL (ref 0.00–0.08)

## 2017-02-02 NOTE — ED Triage Notes (Addendum)
Pt to ER for 2 days of weakness, fatigue, chest palpitations, and left arm pain. Pt states she believes it is related to her blood pressure medications. Pt is a/o x4. VSS. Pt appears flushed.

## 2017-02-03 ENCOUNTER — Other Ambulatory Visit: Payer: Self-pay | Admitting: Student

## 2017-02-03 DIAGNOSIS — R079 Chest pain, unspecified: Secondary | ICD-10-CM | POA: Diagnosis not present

## 2017-02-03 DIAGNOSIS — R002 Palpitations: Secondary | ICD-10-CM

## 2017-02-03 DIAGNOSIS — R0989 Other specified symptoms and signs involving the circulatory and respiratory systems: Secondary | ICD-10-CM | POA: Diagnosis not present

## 2017-02-03 DIAGNOSIS — I10 Essential (primary) hypertension: Secondary | ICD-10-CM

## 2017-02-03 DIAGNOSIS — R42 Dizziness and giddiness: Secondary | ICD-10-CM

## 2017-02-03 DIAGNOSIS — I208 Other forms of angina pectoris: Secondary | ICD-10-CM

## 2017-02-03 LAB — I-STAT TROPONIN, ED: TROPONIN I, POC: 0.01 ng/mL (ref 0.00–0.08)

## 2017-02-03 LAB — CBG MONITORING, ED: GLUCOSE-CAPILLARY: 77 mg/dL (ref 65–99)

## 2017-02-03 LAB — TROPONIN I: Troponin I: <0.03 ng/mL (ref <0.03)

## 2017-02-03 MED ORDER — ONDANSETRON HCL 4 MG/2ML IJ SOLN: 4.0000 mg | Freq: Four times a day (QID) | INTRAMUSCULAR | Status: DC | PRN

## 2017-02-03 MED ORDER — ENOXAPARIN SODIUM 40 MG/0.4ML ~~LOC~~ SOLN
40.0000 mg | SUBCUTANEOUS | Status: DC
  Administered 2017-02-03: 40 mg via SUBCUTANEOUS
  Filled 2017-02-03: qty 0.4

## 2017-02-03 MED ORDER — ACETAMINOPHEN 325 MG PO TABS: 650.0000 mg | ORAL_TABLET | ORAL | Status: DC | PRN

## 2017-02-03 NOTE — Discharge Summary (Signed)
Physician Discharge Summary  NHUNG DANKO MEQ:[id number] DOB: 11-24-1945 DOA: 02/02/2017  PCP[facility] [place] date: 02/02/2017 Discharge date: 02/03/2017   Recommendations for Outpatient Follow-Up:   Did not tolerate BB in past, will continue ACE only To get outpatient 30 day event monitor   Discharge Diagnosis:   Principal Problem:   Chest pain, rule out acute myocardial infarction Active Problems:   Labile hypertension   Discharge disposition:  Home  Discharge Condition: Improved.  Diet recommendation: Low sodium, heart healthy  Wound care: None.   History of Present Illness:   Madison Nunez is a 71 y.o. female with medical history significant of HTN, hypothyroidism.  Patient presents to the ED with c/o multiple episodes of palpitations, chest pressure that radiates to L arm.  These over last 2 days.  Tend to occur with activity and slowly improve over 30-45 mins after she goes to lay down.  Father died of MI at 89, brother died of MI at 37.  Stress test 6 years ago was nl.     Hospital Course by Problem:   Chest pain -appears more to be arrythmia -outpatient follow up -event monitor -did not tolerate BB in past, resume ACE     Medical Consultants:    None.   Discharge Exam:   Vitals:   02/03/17 1515 02/03/17 1650  BP: 99/63 109/74  Pulse: 61 98  Resp: 11 16  Temp:    SpO2: 96% 97%   Vitals:   02/03/17 1445 02/03/17 1500 02/03/17 1515 02/03/17 1650  BP: 115/73 111/66 99/63 109/74  Pulse: 74 73 61 98  Resp: 20 (!) 24 11 16   Temp:      TempSrc:      SpO2: 98% 96% 96% 97%    Gen:  NAD    The results of significant diagnostics from this hospitalization (including imaging, microbiology, ancillary and laboratory) are listed below for reference.     Procedures and Diagnostic Studies:   Dg Chest 2 View  Result Date: 02/02/2017 CLINICAL DATA:  Weakness, chest palpitations and left arm pain for 2 days.  EXAM: CHEST  2 VIEW COMPARISON:  10/02/2013 and prior exams FINDINGS: The cardiomediastinal silhouette is unremarkable. A left Port-A-Cath is again noted with tip overlying the mid- upper SVC. There is no evidence of focal airspace disease, pulmonary edema, suspicious pulmonary nodule/mass, pleural effusion, or pneumothorax. No acute bony abnormalities are identified. IMPRESSION: No active cardiopulmonary disease. Electronically Signed   By: Margarette Canada M.D.   On: 02/02/2017 17:57     Labs:   Basic Metabolic Panel: Recent Labs  Lab 02/02/17 1714  NA 136  K 4.0  CL 104  CO2 20*  GLUCOSE 104*  BUN 13  CREATININE 0.71  CALCIUM 9.3   GFR CrCl cannot be calculated (Unknown ideal weight.). Liver Function Tests: No results for input(s): AST, ALT, ALKPHOS, BILITOT, PROT, ALBUMIN in the last 168 hours. No results for input(s): LIPASE, AMYLASE in the last 168 hours. No results for input(s): AMMONIA in the last 168 hours. Coagulation profile No results for input(s): INR, PROTIME in the last 168 hours.  CBC: Recent Labs  Lab 02/02/17 1714  WBC 5.3  HGB 14.7  HCT 42.9  MCV 96.4  PLT 244   Cardiac Enzymes: Recent Labs  Lab 02/03/17 0458 02/03/17 0758 02/03/17 1253  TROPONINI <0.03 <0.03 <0.03   BNP: Invalid input(s): POCBNP CBG: Recent Labs  Lab 02/03/17 1446  GLUCAP 77  D-Dimer No results for input(s): DDIMER in the last 72 hours. Hgb A1c No results for input(s): HGBA1C in the last 72 hours. Lipid Profile No results for input(s): CHOL, HDL, LDLCALC, TRIG, CHOLHDL, LDLDIRECT in the last 72 hours. Thyroid function studies No results for input(s): TSH, T4TOTAL, T3FREE, THYROIDAB in the last 72 hours.  Invalid input(s): FREET3 Anemia work up No results for input(s): VITAMINB12, FOLATE, FERRITIN, TIBC, IRON, RETICCTPCT in the last 72 hours. Microbiology No results found for this or any previous visit (from the past 240 hour(s)).   Discharge Instructions:    Discharge Instructions    Diet - low sodium heart healthy   Complete by:  As directed    Increase activity slowly   Complete by:  As directed      Allergies as of 02/03/2017      Reactions   Hctz [hydrochlorothiazide] Other (See Comments)   Hyponatremia (leading to hospitalization)   Beta Adrenergic Blockers Other (See Comments)   Metoprolol and carvedilol have stopped working for patient over time, causing nausea also   Prednisone Other (See Comments)   Had reaction with use prior to chemo      Medication List    STOP taking these medications   amLODipine 5 MG tablet Commonly known as:  NORVASC     TAKE these medications   aspirin EC 81 MG tablet Take 81 mg by mouth daily.   CALTRATE 600+D PO Take 1 tablet by mouth 2 (two) times daily.   cholecalciferol 1000 units tablet Commonly known as:  VITAMIN D Take 1,000 Units by mouth daily.   FLAXSEED OIL PO Take 2 capsules by mouth daily.   levothyroxine 50 MCG tablet Commonly known as:  SYNTHROID, LEVOTHROID Take 50 mcg by mouth daily.   lisinopril 40 MG tablet Commonly known as:  PRINIVIL,ZESTRIL Take 1 tablet (40 mg total) by mouth daily. What changed:  when to take this   meclizine 25 MG tablet Commonly known as:  ANTIVERT Take one half to one tablet every 6-8 hours as needed for dizziness.   multivitamins ther. w/minerals Tabs tablet Take 1 tablet by mouth daily.   naproxen 500 MG tablet Commonly known as:  NAPROSYN Take 1 tablet (500 mg total) by mouth 2 (two) times daily with a meal.   TURMERIC PO Take 1 capsule by mouth daily.      Follow-up Information    Hilty, Nadean Corwin, MD Follow up.   Specialty:  Cardiology Why:  The office will call you to schedule an appointment date and time to pick up your cardiac event monitor. Will follow-up with Dr. Debara Pickett on 03/14/2016 at 8:45AM to review results.  Contact information: [place] [address] [place] [place], [facility] up in 1 week(s).   Specialty:  Family Medicine Contact information: [address] [place] [address] [phone]            Time coordinating discharge: 35 min  Signed:  LOUNELL SCHUMACHER   [facility] 02/03/2017, 5:05 PM

## 2017-02-03 NOTE — Progress Notes (Signed)
Patient admitted after midnight, please see H&P.  No current chest pain.  Very active at home gardening.  Had an outpatient stress test years ago.  Await cardiology recommendations.  Eulogio Bear DO

## 2017-02-03 NOTE — ED Notes (Signed)
Delay in lab draw,  edp at bedside. 

## 2017-02-03 NOTE — ED Notes (Signed)
Pt. Arrived to [place] , Pt. Is alert and oriented X4.  Call bell given to pt.

## 2017-02-03 NOTE — ED Notes (Addendum)
Pt reports she feels "shaky" because she has not eaten anything. HR-79, BP-135/86. CBG to be taken. Pt talking in clear, complete sentences. Skin warm and dry. Pt then able to ambulate to restroom with independent steady gait.

## 2017-02-03 NOTE — ED Notes (Signed)
Patient able to ambulate independently  

## 2017-02-03 NOTE — ED Notes (Signed)
Pt was able to ambulate from bed to restroom located in room, tolerated well.

## 2017-02-03 NOTE — Consult Note (Signed)
Cardiology Consult    Patient ID: Madison Nunez; [id number]; [birth year]   Admit date: 02/02/2017 Date of Consult: 02/03/2017  Primary Care Provider: [facility] Primary Cardiologist: Previously Dr. Haroldine Laws (in 2012)  Patient Profile    Madison Nunez is a 71 y.o. female with past medical history of HTN, HLD, hypothyroidism, breast cancer (s.p chemotherapy and lumpectomy), and family history of CAD who is being seen today for the evaluation of palpitations at the request of Dr. Eliseo Squires.   History of Present Illness    [name] was last evaluated by Cardiology in 02/2011 for atypical chest pain with a NST being negative at that time. Was informed to follow-up on an as-needed basis.   She presented to [facility] ED on 02/02/2017 for evaluation of fatigue and palpitations. She reports being very active at baseline, doing yard work on a weekly basis and still working part-time as a Licensed conveyancer.   She has always had issues with HTN by her report and has been intolerant to multiple medications in the past including HCTZ, Coreg, and Toprol-XL. She was started on Amlodipine 2+ years ago but has always experienced side-effects to the medication. She reports flushing and nausea when taking the medication. She underwent cataract surgery 4 weeks ago and reports having episodes of fatigue since.   Starting Saturday, she developed palpitations with associated nausea, flushing, and presyncope upon waking. She went back to bed and her symptoms resolved within 1 hour. Had a recurrent episode that afternoon, lasting less than 30 minutes. Denies any associated chest pain, dyspnea, orthopnea, PND or lower extremity edema.   She had a recurrent episode of palpitations on 12/2, which prompted her to come to the ED. She denies any recurrent symptoms since admission, but mentions she did not take her Amlodipine last night as she feels like this is contributing to her symptoms.    Initial labs show WBC of 5.3, Hgb 14.7, platelets 244, Na+ 136, K+ 4.0, creatinine 0.71. Initial and cyclic troponin values have been negative. CXR with no active cardiopulmonary disease. EKG shows sinus tachycardia, HR 108, LAD with LVH and incomplete RBBB (similar to prior tracings).    Past Medical History:  Diagnosis Date  . Arthritis    hands and lower back  . Breast cancer [facility]) 2005   right lumpectomy, chemotherapy with A/C combo  & Taxol, mammogram 02/06/11 without recurrence  . Dyspnea    Echocardiogram 02/20/11: EF 60-65%.;  ETT-myoveiw 12/12 normal with EF 80%  . Hypertension   . Hypothyroidism   . Personal history of chemotherapy 10/2003  . Personal history of radiation therapy 03/2004    Past Surgical History:  Procedure Laterality Date  . APPENDECTOMY  1955  . BREAST BIOPSY  08/29/2003   malignant  . BREAST LUMPECTOMY  2005   right  . TONSILLECTOMY     "as a child"     Home Medications:  Prior to Admission medications   Medication Sig Start Date End Date Taking? Authorizing Provider  amLODipine (NORVASC) 5 MG tablet Take 5 mg by mouth every evening.    Yes [provider]  aspirin EC 81 MG tablet Take 81 mg by mouth daily.    Yes [provider]  Calcium Carbonate-Vitamin D (CALTRATE 600+D PO) Take 1 tablet by mouth 2 (two) times daily.    Yes [provider]  cholecalciferol (VITAMIN D) 1000 UNITS tablet Take 1,000 Units by mouth daily.   Yes [provider]  Flaxseed, Linseed, (FLAXSEED OIL PO) Take 2 capsules by mouth daily.   Yes [provider]  levothyroxine (SYNTHROID, LEVOTHROID) 50 MCG tablet Take 50 mcg by mouth daily.    Yes [provider]  lisinopril (PRINIVIL,ZESTRIL) 40 MG tablet Take 1 tablet (40 mg total) by mouth daily. Patient taking differently: Take 40 mg by mouth every evening.  10/14/12  Yes Short, Noah Delaine, MD  meclizine (ANTIVERT) 25 MG tablet Take one half to one tablet every 6-8  hours as needed for dizziness. 11/25/16  Yes Janne Napoleon, NP  Multiple Vitamins-Minerals (MULTIVITAMINS THER. W/MINERALS) TABS Take 1 tablet by mouth daily.     Yes [provider]  naproxen (NAPROSYN) 500 MG tablet Take 1 tablet (500 mg total) by mouth 2 (two) times daily with a meal. 11/03/14  Yes Szekalski, Kaitlyn, PA-C  TURMERIC PO Take 1 capsule by mouth daily.   Yes [provider]    Inpatient Medications: Scheduled Meds: . enoxaparin (LOVENOX) injection  40 mg Subcutaneous Q24H   Continuous Infusions:  PRN Meds: acetaminophen, ondansetron (ZOFRAN) IV  Allergies:    Allergies  Allergen Reactions  . Hctz [Hydrochlorothiazide] Other (See Comments)    Hyponatremia (leading to hospitalization)  . Beta Adrenergic Blockers Other (See Comments)    Metoprolol and carvedilol have stopped working for patient over time, causing nausea also  . Prednisone Other (See Comments)    Had reaction with use prior to chemo    Social History:   Social History   Socioeconomic History  . Marital status: Divorced    Spouse name: Not on file  . Number of children: Not on file  . Years of education: Not on file  . Highest education level: Not on file  Social Needs  . Financial resource strain: Not on file  . Food insecurity - worry: Not on file  . Food insecurity - inability: Not on file  . Transportation needs - medical: Not on file  . Transportation needs - non-medical: Not on file  Occupational History  . Not on file  Tobacco Use  . Smoking status: Former Smoker    Packs/day: 1.00    Years: 5.00    Pack years: 5.00    Types: Cigarettes  . Smokeless tobacco: Never Used  Substance and Sexual Activity  . Alcohol use: Yes    Alcohol/week: 4.2 oz    Types: 7 Glasses of wine per week  . Drug use: No  . Sexual activity: No  Other Topics Concern  . Not on file  Social History Narrative   Lives alone.  Drives.       Family History:    Family History  Problem  Relation Age of Onset  . Coronary artery disease Brother 64       died of first MI at 40  . Coronary artery disease Father        had juvenile DM most of his life, had 3 MI's the first in his early 68's, died at 22  . Diabetes Father   . Breast cancer Maternal Grandmother       Review of Systems    General:  No chills, fever, night sweats or weight changes.  Cardiovascular:  No chest pain, dyspnea on exertion, edema, orthopnea,  paroxysmal nocturnal dyspnea. Positive for palpitations. Dermatological: No rash, lesions/masses Respiratory: No cough, dyspnea Urologic: No hematuria, dysuria Abdominal:   No nausea, vomiting, diarrhea, bright red blood per rectum, melena, or hematemesis Neurologic:  No visual changes,  wkns, changes in mental status. All other systems reviewed and are otherwise negative except as noted above.  Physical Exam/Data    Vitals:   02/03/17 1215 02/03/17 1230 02/03/17 1330 02/03/17 1400  BP: 98/63 (!) 100/59 102/64 135/86  Pulse: 68 64 65 94  Resp: 12 14 13 18   Temp:      TempSrc:      SpO2: 94% 95% 95% 98%   No intake or output data in the 24 hours ending 02/03/17 1432 There were no vitals filed for this visit. There is no height or weight on file to calculate BMI.   General: Pleasant Caucasian female appearing in NAD Psych: Normal affect. Neuro: Alert and oriented X 3. Moves all extremities spontaneously. HEENT: Normal  Neck: Supple without bruits or JVD. Lungs:  Resp regular and unlabored, CTA without wheezing or rales. Heart: RRR no s3, s4, or murmurs. Abdomen: Soft, non-tender, non-distended, BS + x 4.  Extremities: No clubbing, cyanosis or edema. DP/PT/Radials 2+ and equal bilaterally.   EKG:  The EKG was personally reviewed and demonstrates: sinus tachycardia, HR 108, LAD with LVH and incomplete RBBB (similar to prior tracings).   Telemetry:  Telemetry was personally reviewed and demonstrates:  NSR with occasional PAC's.    Labs/Studies      Relevant CV Studies:  NST: 02/2011 Impression Exercise Capacity:  Good exercise capacity. BP Response:  Normal blood pressure response. Clinical Symptoms:  No chest pain. ECG Impression:  No significant ST segment change suggestive of ischemia. Comparison with Prior Nuclear Study: No images to compare  Overall Impression:  Normal stress nuclear study.  Normal EF    Laboratory Data:  Chemistry Recent Labs  Lab 02/02/17 1714  NA 136  K 4.0  CL 104  CO2 20*  GLUCOSE 104*  BUN 13  CREATININE 0.71  CALCIUM 9.3  GFRNONAA >60  GFRAA >60  ANIONGAP 12    No results for input(s): PROT, ALBUMIN, AST, ALT, ALKPHOS, BILITOT in the last 168 hours. Hematology Recent Labs  Lab 02/02/17 1714  WBC 5.3  RBC 4.45  HGB 14.7  HCT 42.9  MCV 96.4  MCH 33.0  MCHC 34.3  RDW 12.6  PLT 244   Cardiac Enzymes Recent Labs  Lab 02/03/17 0458 02/03/17 0758 02/03/17 1253  TROPONINI <0.03 <0.03 <0.03    Recent Labs  Lab 02/02/17 1723 02/03/17 0056  TROPIPOC 0.00 0.01    BNPNo results for input(s): BNP, PROBNP in the last 168 hours.  DDimer No results for input(s): DDIMER in the last 168 hours.  Radiology/Studies:  Dg Chest 2 View  Result Date: 02/02/2017 CLINICAL DATA:  Weakness, chest palpitations and left arm pain for 2 days. EXAM: CHEST  2 VIEW COMPARISON:  10/02/2013 and prior exams FINDINGS: The cardiomediastinal silhouette is unremarkable. A left Port-A-Cath is again noted with tip overlying the mid- upper SVC. There is no evidence of focal airspace disease, pulmonary edema, suspicious pulmonary nodule/mass, pleural effusion, or pneumothorax. No acute bony abnormalities are identified. IMPRESSION: No active cardiopulmonary disease. Electronically Signed   By: Margarette Canada M.D.   On: 02/02/2017 17:57     Assessment & Plan    1. Palpitations - the patient reports having episodes of palpitations for the past 3 days with associated nausea, flushing, and presyncope. Denies  any associated chest pain, dyspnea, orthopnea, PND or lower extremity edema. Her episodes have lasted for 30 minutes to 1 hour.  - K+ WNL. Would recommend checking Mg and TSH (she has known  hypothyroidism which is followed by her PCP).  - telemetry shows no significant pauses or arrhythmias. Would recommend a 30-day event monitor to assess for significant arrhythmias. Consider echocardiogram as either inpatient or outpatient to assess for structural abnormalities.   2. Chest Pain (?) - Cardiology was initially consulted for evaluation of chest pain but in talking with the patient, she denies any recent chest pain or dyspnea on exertion. Is very active at baseline and denies any anginal symptoms with this.  - EKG shows no acute ischemic changes and cyclic troponin values have been negative.  - no plans for further ischemic evaluation at this time.   3. HTN - BP has been variable at 90/59  - 156/94 since admission.  - intolerant to Amlodipine along with BB's and HCTZ as outlined above. Would resume just Lisinopril 40mg  daily and informed the patient to follow her BP at home.     For questions or updates, please contact [facility] Please consult [email or web address] for contact info under Cardiology/STEMI.  Signed, Erma Heritage, PA-C 02/03/2017, 2:32 PM Pager: [phone]

## 2017-02-03 NOTE — ED Provider Notes (Signed)
[facility] EMERGENCY DEPARTMENT Provider Note   CSN: [id number] Arrival date & time: 02/02/17  1649     History   Chief Complaint Chief Complaint  Patient presents with  . Palpitations  . Dizziness    HPI Madison Nunez is a 71 y.o. female.  Patient is a 71 year old female with a history of prior breast cancer with chemotherapy, hypertension, hypothyroidism presenting today with multiple episodes over the last 2 days of nausea, palpitations and left arm pain.  Patient states she was in her normal state of health on Friday.  When she got up Saturday she was making breakfast when she experienced 1 of these episodes.  She states it all comes on at about the same time she gets a severe nausea, her heart starts beating hard and she gets an aching in her left arm.  She also feels dizzy like she might pass out.  She goes to lay down and symptoms usually improve over 30-45 minutes.  She notices these episodes only happen when she is up doing something.  They never happen while she is at rest.  She was thinking it may be related to her blood pressure medication however she has been on amlodipine and lisinopril for months without any similar symptoms.  She denies any recent travel, only surgery has been cataract surgery and no prior history of blood clot.  Patient denies any URI symptoms, fever, abdominal pain, diarrhea or urinary complaints.  She does admit that her father died at 89 of a heart attack as well as her brother dying at 60 of a heart attack.  She had a stress test approximately 6 years ago that was normal but has not had any other cardiac testing recently.   The history is provided by the patient.  Palpitations   This is a new problem. The current episode started 2 days ago. Episode frequency: multiple times a day. The problem has been gradually worsening. The problem is associated with an unknown factor. On average, each episode lasts 45 minutes. Associated symptoms  include nausea and dizziness. Pertinent negatives include no chest pain, no chest pressure, no leg pain, no lower extremity edema, no cough and no shortness of breath. Associated symptoms comments: Arm discomfort. She has tried bed rest for the symptoms. The treatment provided significant relief. Risk factors include family history and post menopause.  Dizziness  Associated symptoms: nausea and palpitations   Associated symptoms: no chest pain and no shortness of breath     Past Medical History:  Diagnosis Date  . Arthritis    hands and lower back  . Breast cancer [facility]) 2005   right lumpectomy, chemotherapy with A/C combo  & Taxol, mammogram 02/06/11 without recurrence  . Dyspnea    Echocardiogram 02/20/11: EF 60-65%.;  ETT-myoveiw 12/12 normal with EF 80%  . Hypertension   . Hypothyroidism   . Personal history of chemotherapy 10/2003  . Personal history of radiation therapy 03/2004    Patient Active Problem List   Diagnosis Date Noted  . Hyponatremia 10/13/2012  . Acute upper respiratory infections of unspecified site 10/13/2012  . Dizziness 03/07/2011  . Shortness of breath 02/19/2011  . Nausea alone 02/19/2011  . HTN (hypertension) 02/19/2011  . Hypothyroidism 02/19/2011    Past Surgical History:  Procedure Laterality Date  . APPENDECTOMY  1955  . BREAST BIOPSY  08/29/2003   malignant  . BREAST LUMPECTOMY  2005   right  . TONSILLECTOMY     "as a  child"    OB History    No data available       Home Medications    Prior to Admission medications   Medication Sig Start Date End Date Taking? Authorizing Provider  amLODipine (NORVASC) 5 MG tablet Take 5 mg by mouth every evening.     [provider]  aspirin EC 81 MG tablet Take 81 mg by mouth daily.     [provider]  Calcium Carbonate-Vitamin D (CALTRATE 600+D PO) Take 1 tablet by mouth 2 (two) times daily.     [provider]  cholecalciferol (VITAMIN D) 1000 UNITS tablet Take 1,000  Units by mouth daily.    [provider]  Flaxseed, Linseed, (FLAXSEED OIL PO) Take 2 capsules by mouth daily.    [provider]  levothyroxine (SYNTHROID, LEVOTHROID) 50 MCG tablet Take 50 mcg by mouth daily.     [provider]  lisinopril (PRINIVIL,ZESTRIL) 40 MG tablet Take 1 tablet (40 mg total) by mouth daily. Patient taking differently: Take 40 mg by mouth every evening.  10/14/12   Janece Canterbury, MD  meclizine (ANTIVERT) 25 MG tablet Take one half to one tablet every 6-8 hours as needed for dizziness. 11/25/16   Janne Napoleon, NP  Multiple Vitamins-Minerals (MULTIVITAMINS THER. W/MINERALS) TABS Take 1 tablet by mouth daily.      [provider]  naproxen (NAPROSYN) 500 MG tablet Take 1 tablet (500 mg total) by mouth 2 (two) times daily with a meal. 11/03/14   Szekalski, Kaitlyn, PA-C  TURMERIC PO Take 1 capsule by mouth daily.    [provider]    Family History Family History  Problem Relation Age of Onset  . Coronary artery disease Brother 77       died of first MI at 40  . Coronary artery disease Father        had juvenile DM most of his life, had 3 MI's the first in his early 56's, died at 48  . Diabetes Father   . Breast cancer Maternal Grandmother     Social History Social History   Tobacco Use  . Smoking status: Former Smoker    Packs/day: 1.00    Years: 5.00    Pack years: 5.00    Types: Cigarettes  . Smokeless tobacco: Never Used  Substance Use Topics  . Alcohol use: Yes    Alcohol/week: 4.2 oz    Types: 7 Glasses of wine per week  . Drug use: No     Allergies   Hctz [hydrochlorothiazide]; Beta adrenergic blockers; and Prednisone   Review of Systems Review of Systems  Respiratory: Negative for cough and shortness of breath.   Cardiovascular: Positive for palpitations. Negative for chest pain.  Gastrointestinal: Positive for nausea.  Neurological: Positive for dizziness.  All other systems reviewed and are  negative.    Physical Exam Updated Vital Signs BP (!) 142/85 (BP Location: Right Arm)   Pulse 96   Temp 98 F (36.7 C) (Oral)   Resp 16   LMP 02/19/2011   SpO2 98%   Physical Exam  Constitutional: She is oriented to person, place, and time. She appears well-developed and well-nourished. No distress.  HENT:  Head: Normocephalic and atraumatic.  Mouth/Throat: Oropharynx is clear and moist.  Eyes: Conjunctivae and EOM are normal. Pupils are equal, round, and reactive to light.  Neck: Normal range of motion. Neck supple.  Cardiovascular: Normal rate, regular rhythm and intact distal pulses.  No murmur heard. Pulmonary/Chest:  Effort normal and breath sounds normal. No respiratory distress. She has no wheezes. She has no rales.  Abdominal: Soft. She exhibits no distension. There is no tenderness. There is no rebound and no guarding.  Musculoskeletal: Normal range of motion. She exhibits no edema or tenderness.  Neurological: She is alert and oriented to person, place, and time.  Skin: Skin is warm and dry. No rash noted. No erythema.  Psychiatric: She has a normal mood and affect. Her behavior is normal.  Nursing note and vitals reviewed.    ED Treatments / Results  Labs (all labs ordered are listed, but only abnormal results are displayed) Labs Reviewed  BASIC METABOLIC PANEL - Abnormal; Notable for the following components:      Result Value   CO2 20 (*)    Glucose, Bld 104 (*)    All other components within normal limits  CBC  I-STAT TROPONIN, ED  I-STAT TROPONIN, ED    EKG  EKG Interpretation  Date/Time:  Sunday February 02 2017 17:07:26 EST Ventricular Rate:  108 PR Interval:  182 QRS Duration: 110 QT Interval:  354 QTC Calculation: 474 R Axis:   -37 Text Interpretation:  Sinus tachycardia Left axis deviation Incomplete right bundle branch block Moderate voltage criteria for LVH, may be normal variant Nonspecific T wave abnormality No significant change since  last tracing Confirmed by Blanchie Dessert [phone]) on 02/02/2017 11:58:41 PM       Radiology Dg Chest 2 View  Result Date: 02/02/2017 CLINICAL DATA:  Weakness, chest palpitations and left arm pain for 2 days. EXAM: CHEST  2 VIEW COMPARISON:  10/02/2013 and prior exams FINDINGS: The cardiomediastinal silhouette is unremarkable. A left Port-A-Cath is again noted with tip overlying the mid- upper SVC. There is no evidence of focal airspace disease, pulmonary edema, suspicious pulmonary nodule/mass, pleural effusion, or pneumothorax. No acute bony abnormalities are identified. IMPRESSION: No active cardiopulmonary disease. Electronically Signed   By: Margarette Canada M.D.   On: 02/02/2017 17:57    Procedures Procedures (including critical care time)  Medications Ordered in ED Medications - No data to display   Initial Impression / Assessment and Plan / ED Course  I have reviewed the triage vital signs and the nursing notes.  Pertinent labs & imaging results that were available during my care of the patient were reviewed by me and considered in my medical decision making (see chart for details).     Patient presenting today with symptoms concerning for ACS.  She is currently symptom-free.  She has never had symptoms like this in the past and has risk factors for coronary artery disease.  Patient is EKG well pain-free has no acute findings, chest x-ray, CBC, BMP, troponin were all without acute findings.  Patient symptoms do not sound like orthostatic hypotension as they do not occur every time she is standing or doing something.  Less likely to be related to medications that she has been on these medications for months now without similar symptoms.  Low suspicion for PE or dissection.  Lower suspicion for abdominal cause that she is not having any abdominal pain even though she does get nausea with the spells. We will get orthostatics as well as a second troponin.  Feel that patient would benefit  from admission for chest pain rule out.  Final Clinical Impressions(s) / ED Diagnoses   Final diagnoses:  Angina of effort [facility])    ED Discharge Orders    None  Blanchie Dessert, MD 02/03/17 [phone]

## 2017-02-03 NOTE — H&P (Signed)
History and Physical    Madison Nunez:[id number] DOB: 12/26/1945 DOA: 02/02/2017  PCP: [facility]  Patient coming from: Home  I have personally briefly reviewed patient's old medical records in [place]  Chief Complaint: Palpitations, dizziness  HPI: Madison Nunez is a 71 y.o. female with medical history significant of HTN, hypothyroidism.  Patient presents to the ED with c/o multiple episodes of palpitations, chest pressure that radiates to L arm.  These over last 2 days.  Tend to occur with activity and slowly improve over 30-45 mins after she goes to lay down.  Father died of MI at 103, brother died of MI at 87.  Stress test 6 years ago was nl.   ED Course: Trop neg, EKG shows a chronic RBBB.   Review of Systems: As per HPI otherwise 10 point review of systems negative.   Past Medical History:  Diagnosis Date  . Arthritis    hands and lower back  . Breast cancer [facility]) 2005   right lumpectomy, chemotherapy with A/C combo  & Taxol, mammogram 02/06/11 without recurrence  . Dyspnea    Echocardiogram 02/20/11: EF 60-65%.;  ETT-myoveiw 12/12 normal with EF 80%  . Hypertension   . Hypothyroidism   . Personal history of chemotherapy 10/2003  . Personal history of radiation therapy 03/2004    Past Surgical History:  Procedure Laterality Date  . APPENDECTOMY  1955  . BREAST BIOPSY  08/29/2003   malignant  . BREAST LUMPECTOMY  2005   right  . TONSILLECTOMY     "as a child"     reports that she has quit smoking. Her smoking use included cigarettes. She has a 5.00 pack-year smoking history. she has never used smokeless tobacco. She reports that she drinks about 4.2 oz of alcohol per week. She reports that she does not use drugs.  Allergies  Allergen Reactions  . Hctz [Hydrochlorothiazide] Other (See Comments)    Hyponatremia (leading to hospitalization)  . Beta Adrenergic Blockers Other (See Comments)    Metoprolol and carvedilol  have stopped working for patient over time, causing nausea also  . Prednisone Other (See Comments)    Had reaction with use prior to chemo    Family History  Problem Relation Age of Onset  . Coronary artery disease Brother 49       died of first MI at 53  . Coronary artery disease Father        had juvenile DM most of his life, had 3 MI's the first in his early 56's, died at 18  . Diabetes Father   . Breast cancer Maternal Grandmother      Prior to Admission medications   Medication Sig Start Date End Date Taking? Authorizing Provider  amLODipine (NORVASC) 5 MG tablet Take 5 mg by mouth every evening.    Yes [provider]  aspirin EC 81 MG tablet Take 81 mg by mouth daily.    Yes [provider]  Calcium Carbonate-Vitamin D (CALTRATE 600+D PO) Take 1 tablet by mouth 2 (two) times daily.    Yes [provider]  cholecalciferol (VITAMIN D) 1000 UNITS tablet Take 1,000 Units by mouth daily.   Yes [provider]  Flaxseed, Linseed, (FLAXSEED OIL PO) Take 2 capsules by mouth daily.   Yes [provider]  levothyroxine (SYNTHROID, LEVOTHROID) 50 MCG tablet Take 50 mcg by mouth daily.    Yes [provider]  lisinopril (PRINIVIL,ZESTRIL) 40 MG  tablet Take 1 tablet (40 mg total) by mouth daily. Patient taking differently: Take 40 mg by mouth every evening.  10/14/12  Yes Short, Noah Delaine, MD  meclizine (ANTIVERT) 25 MG tablet Take one half to one tablet every 6-8 hours as needed for dizziness. 11/25/16  Yes Janne Napoleon, NP  Multiple Vitamins-Minerals (MULTIVITAMINS THER. W/MINERALS) TABS Take 1 tablet by mouth daily.     Yes [provider]  naproxen (NAPROSYN) 500 MG tablet Take 1 tablet (500 mg total) by mouth 2 (two) times daily with a meal. 11/03/14  Yes Szekalski, Kaitlyn, PA-C  TURMERIC PO Take 1 capsule by mouth daily.   Yes [provider]    Physical Exam: Vitals:   02/03/17 0030 02/03/17 0100 02/03/17 0200  02/03/17 0300  BP: (!) 156/94 139/87 106/75 112/68  Pulse: 93 90 88 76  Resp: 15 17 18 17   Temp:      TempSrc:      SpO2: 98% 98% 97% 98%    Constitutional: NAD, calm, comfortable Eyes: PERRL, lids and conjunctivae normal ENMT: Mucous membranes are moist. Posterior pharynx clear of any exudate or lesions.Normal dentition.  Neck: normal, supple, no masses, no thyromegaly Respiratory: clear to auscultation bilaterally, no wheezing, no crackles. Normal respiratory effort. No accessory muscle use.  Cardiovascular: Regular rate and rhythm, no murmurs / rubs / gallops. No extremity edema. 2+ pedal pulses. No carotid bruits.  Abdomen: no tenderness, no masses palpated. No hepatosplenomegaly. Bowel sounds positive.  Musculoskeletal: no clubbing / cyanosis. No joint deformity upper and lower extremities. Good ROM, no contractures. Normal muscle tone.  Skin: no rashes, lesions, ulcers. No induration Neurologic: CN 2-12 grossly intact. Sensation intact, DTR normal. Strength 5/5 in all 4.  Psychiatric: Normal judgment and insight. Alert and oriented x 3. Normal mood.    Labs on Admission: I have personally reviewed following labs and imaging studies  CBC: Recent Labs  Lab 02/02/17 1714  WBC 5.3  HGB 14.7  HCT 42.9  MCV 96.4  PLT 924   Basic Metabolic Panel: Recent Labs  Lab 02/02/17 1714  NA 136  K 4.0  CL 104  CO2 20*  GLUCOSE 104*  BUN 13  CREATININE 0.71  CALCIUM 9.3   GFR: CrCl cannot be calculated (Unknown ideal weight.). Liver Function Tests: No results for input(s): AST, ALT, ALKPHOS, BILITOT, PROT, ALBUMIN in the last 168 hours. No results for input(s): LIPASE, AMYLASE in the last 168 hours. No results for input(s): AMMONIA in the last 168 hours. Coagulation Profile: No results for input(s): INR, PROTIME in the last 168 hours. Cardiac Enzymes: No results for input(s): CKTOTAL, CKMB, CKMBINDEX, TROPONINI in the last 168 hours. BNP (last 3 results) No results for  input(s): PROBNP in the last 8760 hours. HbA1C: No results for input(s): HGBA1C in the last 72 hours. CBG: No results for input(s): GLUCAP in the last 168 hours. Lipid Profile: No results for input(s): CHOL, HDL, LDLCALC, TRIG, CHOLHDL, LDLDIRECT in the last 72 hours. Thyroid Function Tests: No results for input(s): TSH, T4TOTAL, FREET4, T3FREE, THYROIDAB in the last 72 hours. Anemia Panel: No results for input(s): VITAMINB12, FOLATE, FERRITIN, TIBC, IRON, RETICCTPCT in the last 72 hours. Urine analysis:    Component Value Date/Time   COLORURINE YELLOW 10/02/2013 [place] 10/02/2013 1447   LABSPEC 1.006 10/02/2013 1447   PHURINE 6.0 10/02/2013 1447   GLUCOSEU NEGATIVE 10/02/2013 1447   HGBUR NEGATIVE 10/02/2013 1447   BILIRUBINUR NEGATIVE 10/02/2013 1447   KETONESUR NEGATIVE  10/02/2013 1447   PROTEINUR NEGATIVE 10/02/2013 1447   UROBILINOGEN 0.2 10/02/2013 1447   NITRITE NEGATIVE 10/02/2013 1447   LEUKOCYTESUR SMALL (A) 10/02/2013 1447    Radiological Exams on Admission: Dg Chest 2 View  Result Date: 02/02/2017 CLINICAL DATA:  Weakness, chest palpitations and left arm pain for 2 days. EXAM: CHEST  2 VIEW COMPARISON:  10/02/2013 and prior exams FINDINGS: The cardiomediastinal silhouette is unremarkable. A left Port-A-Cath is again noted with tip overlying the mid- upper SVC. There is no evidence of focal airspace disease, pulmonary edema, suspicious pulmonary nodule/mass, pleural effusion, or pneumothorax. No acute bony abnormalities are identified. IMPRESSION: No active cardiopulmonary disease. Electronically Signed   By: Margarette Canada M.D.   On: 02/02/2017 17:57    EKG: Independently reviewed.  Assessment/Plan Principal Problem:   Chest pain, rule out acute myocardial infarction Active Problems:   HTN (hypertension)    1. CP rule out - 1. CP obs pathway 2. Serial trops 3. Tele monitor 4. NPO 5. Cards eval in AM 2. HTN - 1. Continue home BP meds  DVT  prophylaxis: Lovenox Code Status: Full Family Communication: Family at bedside Disposition Plan: Home after admit Consults called: Sent msg to P. Trent for cards eval in AM Admission status: Place in [place], [place] [facility] Pager [phone]  If 7AM-7PM, please contact day team taking care of patient [email or web address] Password [email or web address]  02/03/2017, 4:02 AM

## 2017-02-03 NOTE — ED Notes (Signed)
Pt CBG 77, RN notified.

## 2017-02-14 ENCOUNTER — Other Ambulatory Visit: Payer: Self-pay | Admitting: Student

## 2017-02-14 DIAGNOSIS — R55 Syncope and collapse: Secondary | ICD-10-CM

## 2017-02-14 DIAGNOSIS — R42 Dizziness and giddiness: Secondary | ICD-10-CM

## 2017-02-14 DIAGNOSIS — R002 Palpitations: Secondary | ICD-10-CM

## 2017-02-17 ENCOUNTER — Ambulatory Visit (INDEPENDENT_AMBULATORY_CARE_PROVIDER_SITE_OTHER): Payer: Medicare Other

## 2017-02-17 DIAGNOSIS — R002 Palpitations: Secondary | ICD-10-CM

## 2017-02-17 DIAGNOSIS — R55 Syncope and collapse: Secondary | ICD-10-CM | POA: Diagnosis not present

## 2017-02-17 DIAGNOSIS — R42 Dizziness and giddiness: Secondary | ICD-10-CM

## 2017-03-14 ENCOUNTER — Encounter: Payer: Self-pay | Admitting: Internal Medicine

## 2017-03-14 ENCOUNTER — Ambulatory Visit: Payer: Medicare Other | Admitting: Internal Medicine

## 2017-03-14 VITALS — BP 144/85 | HR 72 | Ht 64.5 in | Wt 153.0 lb

## 2017-03-14 DIAGNOSIS — R002 Palpitations: Secondary | ICD-10-CM

## 2017-03-14 NOTE — Patient Instructions (Signed)
Your physician recommends that you schedule a follow-up appointment as needed with Dr. Debara Pickett.   Dr. Debara Pickett reports that you can STOP wearing your monitor and send it back to the company as directed.

## 2017-03-14 NOTE — Progress Notes (Signed)
OFFICE NOTE  Chief Complaint:  Follow-up palpitations for monitor  Primary Care Physician: [facility]  HPI:  Madison Nunez is a 72 y.o. female with a past medial history significant for breast CA in remission (port still in place), family history of CAD, possible mitral valve prolapse, HTN, hyperlipidemia and anxiety. She presents with several days of labile hypertension, flushing and palpitations, but no complaints of frank chest pain. Troponin has been negative x 5. EKG personally reviewed shows a sinus tachy, LVH with strain, incomplete RBBB. She was previously on carvedilol but then developed flushing. Was on metoprolol, but I'm not sure that controlled her BP - also on lisinopril. Switched by PCP to amlodipine and feels she is now having the same symptoms of flushing. During her hospitalization, I advised stopping her amlodipine and starting low dose toprol. A 30 day monitor was ordered. I have personally reviewed her monitor which shows no significant arrhythmias or extrasystoles. She reports feeling well without further palpitations. She is not taking her b-blocker.  PMHx:  Past Medical History:  Diagnosis Date  . Arthritis    hands and lower back  . Breast cancer [facility]) 2005   right lumpectomy, chemotherapy with A/C combo  & Taxol, mammogram 02/06/11 without recurrence  . Dyspnea    Echocardiogram 02/20/11: EF 60-65%.;  ETT-myoveiw 12/12 normal with EF 80%  . Hypertension   . Hypothyroidism   . Personal history of chemotherapy 10/2003  . Personal history of radiation therapy 03/2004    Past Surgical History:  Procedure Laterality Date  . APPENDECTOMY  1955  . BREAST BIOPSY  08/29/2003   malignant  . BREAST LUMPECTOMY  2005   right  . TONSILLECTOMY     "as a child"    FAMHx:  Family History  Problem Relation Age of Onset  . Coronary artery disease Brother 18       died of first MI at 58  . Coronary artery disease Father        had  juvenile DM most of his life, had 3 MI's the first in his early 30's, died at 45  . Diabetes Father   . Breast cancer Maternal Grandmother     SOCHx:   reports that she has quit smoking. Her smoking use included cigarettes. She has a 5.00 pack-year smoking history. she has never used smokeless tobacco. She reports that she drinks about 4.2 oz of alcohol per week. She reports that she does not use drugs.  ALLERGIES:  Allergies  Allergen Reactions  . Hctz [Hydrochlorothiazide] Other (See Comments)    Hyponatremia (leading to hospitalization)  . Beta Adrenergic Blockers Other (See Comments)    Metoprolol and carvedilol have stopped working for patient over time, causing nausea also  . Prednisone Other (See Comments)    Had reaction with use prior to chemo    ROS: Pertinent items noted in HPI and remainder of comprehensive ROS otherwise negative.  HOME MEDS: Current Outpatient Medications on File Prior to Visit  Medication Sig Dispense Refill  . aspirin EC 81 MG tablet Take 81 mg by mouth daily.     . Calcium Carbonate-Vitamin D (CALTRATE 600+D PO) Take 1 tablet by mouth 2 (two) times daily.     . cholecalciferol (VITAMIN D) 1000 UNITS tablet Take 1,000 Units by mouth daily.    . Flaxseed, Linseed, (FLAXSEED OIL PO) Take 2 capsules by mouth daily.    Marland Kitchen levothyroxine (SYNTHROID, LEVOTHROID) 50 MCG tablet Take 50  mcg by mouth daily.     Marland Kitchen lisinopril (PRINIVIL,ZESTRIL) 40 MG tablet Take 1 tablet (40 mg total) by mouth daily. (Patient taking differently: Take 40 mg by mouth every evening. ) 30 tablet 0  . Multiple Vitamins-Minerals (MULTIVITAMINS THER. W/MINERALS) TABS Take 1 tablet by mouth daily.      . TURMERIC PO Take 1 capsule by mouth daily.     No current facility-administered medications on file prior to visit.     LABS/IMAGING: No results found for this or any previous visit (from the past 48 hour(s)). No results found.  LIPID PANEL: No results found for: CHOL, TRIG, HDL,  CHOLHDL, VLDL, LDLCALC, LDLDIRECT   WEIGHTS: Wt Readings from Last 3 Encounters:  03/14/17 153 lb (69.4 kg)  04/23/15 160 lb 4.8 oz (72.7 kg)  11/03/14 146 lb (66.2 kg)    VITALS: BP (!) 144/85   Pulse 72   Ht 5' 4.5" (1.638 m)   Wt 153 lb (69.4 kg)   LMP 02/19/2011   BMI 25.86 kg/m   EXAM: Deferred  EKG: Deferred  ASSESSMENT: 1. Palpitations - resolved 2. Hypertension  PLAN: 1.   Mrs. Tapanes reports her palpitations have resolved. She is not on a b-blocker due to intolerance in the past. BP is not quite at goal. No significant findings on her monitor. She can follow-up with me as needed.  Pixie Casino, MD, [facility], [place] Director of the Advanced Lipid Disorders &  Cardiovascular Risk Reduction Clinic Diplomate of the American Board of Clinical Lipidology Attending Cardiologist  Direct Dial: [phone]  Fax: [phone]  Website:  [email or web address] 03/14/2017, 8:40 AM

## 2017-03-15 ENCOUNTER — Encounter: Payer: Self-pay | Admitting: Internal Medicine

## 2017-09-22 ENCOUNTER — Other Ambulatory Visit: Payer: Self-pay | Admitting: Family Medicine

## 2017-09-22 DIAGNOSIS — Z1231 Encounter for screening mammogram for malignant neoplasm of breast: Secondary | ICD-10-CM

## 2017-10-13 ENCOUNTER — Ambulatory Visit
Admission: RE | Admit: 2017-10-13 | Discharge: 2017-10-13 | Disposition: A | Discharge status: Home / Self Care | Payer: Medicare Other | Source: Ambulatory Visit | Attending: Family Medicine | Admitting: Family Medicine

## 2017-10-13 DIAGNOSIS — Z1231 Encounter for screening mammogram for malignant neoplasm of breast: Secondary | ICD-10-CM

## 2019-02-10 ENCOUNTER — Ambulatory Visit: Payer: Medicare Other | Admitting: Physician Assistant

## 2019-06-08 ENCOUNTER — Other Ambulatory Visit: Payer: Self-pay | Admitting: Family Medicine

## 2019-06-08 DIAGNOSIS — Z1231 Encounter for screening mammogram for malignant neoplasm of breast: Secondary | ICD-10-CM

## 2019-06-09 ENCOUNTER — Ambulatory Visit: Payer: Medicare Other

## 2019-06-11 ENCOUNTER — Other Ambulatory Visit: Payer: Self-pay

## 2019-06-11 ENCOUNTER — Ambulatory Visit
Admission: RE | Admit: 2019-06-11 | Discharge: 2019-06-11 | Disposition: A | Discharge status: Home / Self Care | Payer: Medicare Other | Source: Ambulatory Visit | Attending: Family Medicine | Admitting: Family Medicine

## 2019-06-11 DIAGNOSIS — Z1231 Encounter for screening mammogram for malignant neoplasm of breast: Secondary | ICD-10-CM

## 2019-06-14 ENCOUNTER — Other Ambulatory Visit: Payer: Self-pay | Admitting: Family Medicine

## 2019-06-14 DIAGNOSIS — R928 Other abnormal and inconclusive findings on diagnostic imaging of breast: Secondary | ICD-10-CM

## 2019-06-28 ENCOUNTER — Other Ambulatory Visit: Payer: Self-pay | Admitting: Family Medicine

## 2019-06-28 ENCOUNTER — Ambulatory Visit
Admission: RE | Admit: 2019-06-28 | Discharge: 2019-06-28 | Disposition: A | Discharge status: Home / Self Care | Payer: Medicare Other | Source: Ambulatory Visit | Attending: Family Medicine | Admitting: Family Medicine

## 2019-06-28 ENCOUNTER — Ambulatory Visit: Payer: Medicare Other

## 2019-06-28 ENCOUNTER — Other Ambulatory Visit: Payer: Self-pay

## 2019-06-28 DIAGNOSIS — R928 Other abnormal and inconclusive findings on diagnostic imaging of breast: Secondary | ICD-10-CM

## 2019-06-28 DIAGNOSIS — R921 Mammographic calcification found on diagnostic imaging of breast: Secondary | ICD-10-CM

## 2019-07-20 ENCOUNTER — Ambulatory Visit (INDEPENDENT_AMBULATORY_CARE_PROVIDER_SITE_OTHER): Payer: Medicare Other

## 2019-07-20 ENCOUNTER — Ambulatory Visit: Payer: Medicare Other | Admitting: Podiatry

## 2019-07-20 ENCOUNTER — Other Ambulatory Visit: Payer: Self-pay | Admitting: Family Medicine

## 2019-07-20 ENCOUNTER — Other Ambulatory Visit: Payer: Self-pay

## 2019-07-20 ENCOUNTER — Encounter: Payer: Self-pay | Admitting: Podiatry

## 2019-07-20 VITALS — Temp 97.4°F

## 2019-07-20 DIAGNOSIS — M2021 Hallux rigidus, right foot: Secondary | ICD-10-CM

## 2019-07-20 DIAGNOSIS — M2041 Other hammer toe(s) (acquired), right foot: Secondary | ICD-10-CM

## 2019-07-20 DIAGNOSIS — M2022 Hallux rigidus, left foot: Secondary | ICD-10-CM | POA: Diagnosis not present

## 2019-07-20 DIAGNOSIS — B351 Tinea unguium: Secondary | ICD-10-CM

## 2019-07-20 DIAGNOSIS — E2839 Other primary ovarian failure: Secondary | ICD-10-CM

## 2019-07-27 NOTE — Progress Notes (Signed)
Subjective:   Patient ID: Madison Nunez, female   DOB: 74 y.o.   MRN: [id number]   HPI 74 year old female presents the office today for concerns of toenails that are thickened discolored to be trended she also has fungus on them. She also has hammertoes in her right third and second digits. Denies any swelling. No weakness or falls. No recent injury.   Review of Systems  All other systems reviewed and are negative.  Past Medical History:  Diagnosis Date  . Arthritis    hands and lower back  . Breast cancer [facility]) 2005   right lumpectomy, chemotherapy with A/C combo  & Taxol, mammogram 02/06/11 without recurrence  . Dyspnea    Echocardiogram 02/20/11: EF 60-65%.;  ETT-myoveiw 12/12 normal with EF 80%  . Hypertension   . Hypothyroidism   . Personal history of chemotherapy 10/2003  . Personal history of radiation therapy 03/2004    Past Surgical History:  Procedure Laterality Date  . APPENDECTOMY  1955  . BREAST BIOPSY  08/29/2003   malignant  . BREAST LUMPECTOMY  2005   right  . TONSILLECTOMY     "as a child"     Current Outpatient Medications:  .  ascorbic Acid (VITAMIN C) 500 MG CPCR, Take by mouth., Disp: , Rfl:  .  aspirin EC 81 MG tablet, Take 81 mg by mouth daily. , Disp: , Rfl:  .  Calcium Carbonate-Vitamin D (CALTRATE 600+D PO), Take 1 tablet by mouth 2 (two) times daily. , Disp: , Rfl:  .  cholecalciferol (VITAMIN D) 1000 UNITS tablet, Take 1,000 Units by mouth daily., Disp: , Rfl:  .  Flaxseed Oil (LINSEED OIL) OIL, Take by mouth., Disp: , Rfl:  .  latanoprost (XALATAN) 0.005 % ophthalmic solution, 1 drop at bedtime., Disp: , Rfl:  .  levothyroxine (SYNTHROID, LEVOTHROID) 50 MCG tablet, Take 50 mcg by mouth daily. , Disp: , Rfl:  .  lisinopril (PRINIVIL,ZESTRIL) 40 MG tablet, Take 1 tablet (40 mg total) by mouth daily. (Patient taking differently: Take 40 mg by mouth every evening. ), Disp: 30 tablet, Rfl: 0 .  Misc Natural Products (TURMERIC CURCUMIN) CAPS, Take by  mouth., Disp: , Rfl:  .  Multiple Vitamins-Minerals (MULTIVITAMINS THER. W/MINERALS) TABS, Take 1 tablet by mouth daily.  , Disp: , Rfl:  .  TURMERIC PO, Take 1 capsule by mouth daily., Disp: , Rfl:   Allergies  Allergen Reactions  . Hctz [Hydrochlorothiazide] Other (See Comments)    Hyponatremia (leading to hospitalization)  . Beta Adrenergic Blockers Other (See Comments)    Metoprolol and carvedilol have stopped working for patient over time, causing nausea also  . Prednisone Other (See Comments)    Had reaction with use prior to chemo          Objective:  Physical Exam  General: AAO x3, NAD  Dermatological: Nails are mildly hypertrophic, dystrophic, discolored with yellow-brown discoloration. No pain to the nails no redness or drainage or signs of infection. No open lesions.  Vascular: Dorsalis Pedis artery and Posterior Tibial artery pedal pulses are 2/4 bilateral with immedate capillary fill time. There is no pain with calf compression, swelling, warmth, erythema.   Neruologic: Grossly intact via light touch bilateral. Describes some numbness to her feet.   Musculoskeletal: Arthritic changes present at first MPJ. Hammertoe contractures present. No other areas of discomfort. Muscular strength 5/5 in all groups tested bilateral.  Gait: Unassisted, Nonantalgic.       Assessment:   74 year old female  with hammertoes, onychomycosis     Plan:  -Treatment options discussed including all alternatives, risks, and complications -Etiology of symptoms were discussed -X-rays obtained reviewed. Arthritic changes present the first MPJ with hammertoe contractures present. -Debrided nails x2 without any complications or bleeding. Discussed treatment options for nail fungus and discussed Lamisil. Will check blood work with her primary care physician. -Discussed modification, offloading pads for hammertoes.  Return in about 3 months (around 10/20/2019).  Trula Slade DPM

## 2019-07-28 ENCOUNTER — Telehealth: Payer: Self-pay | Admitting: *Deleted

## 2019-07-28 NOTE — Telephone Encounter (Signed)
-----   Message from Trula Slade, DPM sent at 07/27/2019  5:46 PM EDT ----- Can you see if we can get recent blood work from her PCP? Thanks.

## 2019-07-28 NOTE — Telephone Encounter (Signed)
Called Dr Bernerd Limbo MD office and got the recent blood work sent over to our office and the fax number is [phone]. Madison Nunez

## 2019-08-04 ENCOUNTER — Telehealth: Payer: Self-pay | Admitting: *Deleted

## 2019-08-04 NOTE — Telephone Encounter (Signed)
-----   Message from Trula Slade, DPM sent at 07/27/2019  5:46 PM EDT ----- Can you see if we can get recent blood work from her PCP? Thanks.

## 2019-08-04 NOTE — Telephone Encounter (Signed)
Called and got the results of the blood work and gave to Dr Jacqualyn Posey yesterday. Lattie Haw

## 2019-10-05 ENCOUNTER — Ambulatory Visit
Admission: RE | Admit: 2019-10-05 | Discharge: 2019-10-05 | Disposition: A | Discharge status: Home / Self Care | Payer: Medicare Other | Source: Ambulatory Visit | Attending: Family Medicine | Admitting: Family Medicine

## 2019-10-05 ENCOUNTER — Other Ambulatory Visit: Payer: Self-pay

## 2019-10-05 DIAGNOSIS — E2839 Other primary ovarian failure: Secondary | ICD-10-CM

## 2019-10-21 ENCOUNTER — Ambulatory Visit: Payer: Medicare Other | Admitting: Podiatry

## 2019-10-21 ENCOUNTER — Other Ambulatory Visit: Payer: Self-pay

## 2019-10-21 DIAGNOSIS — B351 Tinea unguium: Secondary | ICD-10-CM

## 2019-10-21 DIAGNOSIS — G629 Polyneuropathy, unspecified: Secondary | ICD-10-CM

## 2019-10-21 DIAGNOSIS — M79674 Pain in right toe(s): Secondary | ICD-10-CM | POA: Diagnosis not present

## 2019-10-21 DIAGNOSIS — I872 Venous insufficiency (chronic) (peripheral): Secondary | ICD-10-CM

## 2019-10-21 DIAGNOSIS — M79675 Pain in left toe(s): Secondary | ICD-10-CM

## 2019-10-24 NOTE — Progress Notes (Signed)
Subjective: 74 year old female presents the office today for follow evaluation a month appointment.  She has been seen, vein specialist and she had procedures performed to help with the pains.  Since then she has noticed some numbness and tingling to her feet has improved but she still has some paresthesias she reports.  She is wearing compression socks as well.  Denies any open sores.  No rating pain or weakness no falls.  Also suspect that the nails checked again as they are still thickened discolored and patient causing discomfort.  She wants to hold off on any oral medications. Denies any systemic complaints such as fevers, chills, nausea, vomiting. No acute changes since last appointment, and no other complaints at this time.   Objective: AAO x3, NAD DP/PT pulses palpable bilaterally, CRT less than 3 seconds Nails are hypertrophic, dystrophic, brittle, discolored, elongated 2. No surrounding redness or drainage. Tenderness nails to the hallux toenails bilaterally. No open lesions or pre-ulcerative lesions are identified today. There is no area pinpoint tenderness or edema, erythema.  She does describe some paresthesias to the plantar feet but is improving. No pain with calf compression, swelling, warmth, erythema  Assessment: 74 year old female with onychomycosis/onychodystrophy; venous insufficiency, concern for neuropathy  Plan: -All treatment options discussed with the patient including all alternatives, risks, complications.  -Debrided the toenails x2 without any complications or bleeding. Urea cream for the nails.  Continue follow-up with the vein specialist.  We discussed treatment options for neuropathy but she wants to hold off on this and continue to monitor the paresthesias. -Patient encouraged to call the office with any questions, concerns, change in symptoms.   Trula Slade DPM

## 2019-12-29 ENCOUNTER — Ambulatory Visit
Admission: RE | Admit: 2019-12-29 | Discharge: 2019-12-29 | Disposition: A | Discharge status: Home / Self Care | Payer: Medicare Other | Source: Ambulatory Visit | Attending: Family Medicine | Admitting: Family Medicine

## 2019-12-29 ENCOUNTER — Other Ambulatory Visit: Payer: Self-pay | Admitting: Family Medicine

## 2019-12-29 ENCOUNTER — Other Ambulatory Visit: Payer: Self-pay

## 2019-12-29 DIAGNOSIS — R921 Mammographic calcification found on diagnostic imaging of breast: Secondary | ICD-10-CM

## 2020-03-06 ENCOUNTER — Ambulatory Visit: Payer: Medicare Other

## 2020-03-13 ENCOUNTER — Ambulatory Visit: Payer: Medicare Other | Attending: Family Medicine | Admitting: Physical Therapy

## 2020-03-13 ENCOUNTER — Other Ambulatory Visit: Payer: Self-pay

## 2020-03-13 ENCOUNTER — Encounter: Payer: Self-pay | Admitting: Physical Therapy

## 2020-03-13 DIAGNOSIS — M542 Cervicalgia: Secondary | ICD-10-CM | POA: Diagnosis present

## 2020-03-13 DIAGNOSIS — M546 Pain in thoracic spine: Secondary | ICD-10-CM | POA: Diagnosis present

## 2020-03-13 DIAGNOSIS — M6281 Muscle weakness (generalized): Secondary | ICD-10-CM | POA: Diagnosis not present

## 2020-03-13 DIAGNOSIS — R293 Abnormal posture: Secondary | ICD-10-CM | POA: Diagnosis present

## 2020-03-13 NOTE — Patient Instructions (Signed)
Access Code: [email or web address] URL: [email or web address] Date: 03/13/2020 Prepared by: Earlie Counts  Exercises Doorway Pec Stretch at 90 Degrees Abduction - 2 x daily - 7 x weekly - 1 sets - 2 reps - 15 sec hold Seated Thoracic Lumbar Extension - 2 x daily - 7 x weekly - 1 sets - 3 reps - 15 sec hold Seated Passive Cervical Retraction - 2 x daily - 7 x weekly - 1 sets - 3 reps - 10 sec hold [facility] [address], [place],  [address] Phone # [phone] Fax [phone]

## 2020-03-13 NOTE — Therapy (Signed)
[facility] [address], [place], [place], [address] Phone: [phone]   Fax:  [phone]  Physical Therapy Evaluation  Patient Details  Name: Madison Nunez MRN: [id number] Date of Birth: 12/09/1945 Referring Provider (PT): Dr. Bernerd Limbo   Encounter Date: 03/13/2020   PT End of Session - 03/13/20 1529    Visit Number 1    Date for PT Re-Evaluation 06/05/20    Authorization Type [employer] medicare    PT Start Time [id number]    PT Stop Time 1525    PT Time Calculation (min) 40 min    Activity Tolerance Patient tolerated treatment well;No increased pain    Behavior During Therapy WFL for tasks assessed/performed           Past Medical History:  Diagnosis Date  . Arthritis    hands and lower back  . Breast cancer [facility]) 2005   right lumpectomy, chemotherapy with A/C combo  & Taxol, mammogram 02/06/11 without recurrence  . Dyspnea    Echocardiogram 02/20/11: EF 60-65%.;  ETT-myoveiw 12/12 normal with EF 80%  . Hypertension   . Hypothyroidism   . Personal history of chemotherapy 10/2003  . Personal history of radiation therapy 03/2004    Past Surgical History:  Procedure Laterality Date  . APPENDECTOMY  1955  . BREAST BIOPSY  08/29/2003   malignant  . BREAST LUMPECTOMY  2005   right  . TONSILLECTOMY     "as a child"    There were no vitals filed for this visit.    Subjective Assessment - 03/13/20 1455    Subjective Patient stands with a curve in her kyphosis Patietn has to sit to stretch her back out to  decrease the kyphosis.    Patient Stated Goals learn how to make back feel better.    Currently in Pain? Yes    Pain Score 5     Pain Location Thoracic    Pain Orientation Upper;Mid    Pain Descriptors / Indicators Aching    Pain Type Chronic pain    Pain Onset More than a month ago    Pain Frequency Constant    Aggravating Factors  sitting in slump posture    Pain Relieving Factors when get up in the  morning              [facility] PT Assessment - 03/13/20 0001      Assessment   Medical Diagnosis Kyphosis thoracic area    Referring Provider (PT) Dr. Bernerd Limbo    Onset Date/Surgical Date --   chronic     Precautions   Precautions Other (comment)    Precaution Comments history of right breast cancer      Restrictions   Weight Bearing Restrictions No      Balance Screen   Has the patient fallen in the past 6 months No    Has the patient had a decrease in activity level because of a fear of falling?  No    Is the patient reluctant to leave their home because of a fear of falling?  No      Home Ecologist residence      Prior Function   Level of [place] Retired    Leisure garden      Cognition   Overall Cognitive Status Within Functional Limits for tasks assessed      Posture/Postural Control   Posture/Postural Control Postural limitations  Postural Limitations Increased thoracic kyphosis;Posterior pelvic tilt;Forward head;Rounded Shoulders    Posture Comments measure from C7 to T12 37 cm      ROM / Strength   AROM / PROM / Strength AROM;PROM;Strength      AROM   Right Shoulder Flexion 130 Degrees    Right Shoulder ABduction 110 Degrees    Left Shoulder Flexion 130 Degrees    Left Shoulder ABduction 110 Degrees    Cervical Flexion 60    Cervical Extension 10    Cervical - Right Side Bend 30    Cervical - Left Side Bend 20    Cervical - Right Rotation 40    Cervical - Left Rotation 40      Strength   Overall Strength Comments rhomboid and lower trap 3-/5      Palpation   Palpation comment tenderness in cervical paraspinals, thoracic paraspinals, upper traps                      Objective measurements completed on examination: See above findings.       [facility] PT Treatment/Exercise - 03/13/20 0001      Lumbar Exercises: Stretches   Other Lumbar Stretch Exercise sit on chair  and extend the thoracic area; doorway stretch, chin retraction                  PT Education - 03/13/20 1527    Education Details Access Code: [email or web address]    Person(s) Educated Patient    Methods Explanation;Demonstration;Verbal cues    Comprehension Returned demonstration;Verbalized understanding            PT Short Term Goals - 03/13/20 1543      PT SHORT TERM GOAL #1   Title independent with initial HEP    Time 4    Period Weeks    Status New    Target Date 04/10/20      PT SHORT TERM GOAL #2   Title back pain decreased >/= 25% due to understand how to correct her posture    Time 4    Period Weeks    Status New    Target Date 04/10/20      PT SHORT TERM GOAL #3   Title measuring from C7-T12 decreased from 37 cm to 36 cm due to increased in thoracic extension    Time 4    Period Weeks    Status New    Target Date 04/10/20             PT Long Term Goals - 03/13/20 1545      PT LONG TERM GOAL #1   Title independent with advanced postural exercises    Time 12    Period Weeks    Status New    Target Date 06/05/20      PT LONG TERM GOAL #2   Title increased shoulder A/ROM for flexion and abduction >/= 10 degrees due to reduction of thoracic kyphosis    Time 12    Period Weeks    Status New    Target Date 06/05/20      PT LONG TERM GOAL #3   Title thoracic and cervical pain decreased >/= 60% due to improved ROM and postural strength    Time 12    Period Weeks    Status New    Target Date 06/05/20      PT LONG TERM GOAL #4   Title cervical ROM has improved so she  is able to look upward with >/= 50% greater ease    Time 12    Period Weeks    Status New    Target Date 06/05/20      PT LONG TERM GOAL #5   Title able to turn her head to see behind herself >/= 60% greater ease    Time 12    Period Weeks    Status New    Target Date 06/05/20                  Plan - 03/13/20 1531    Clinical Impression Statement Patient is a 75 year  old female with thoracic and cervical pain that is chronic for the past several years. Patient has had difficulty with correcting her posture and is slouching more. Patient stands with increased thoracic kyphosis, rounded shoulders, forward head and reduced lumbar lordosis. Patient reports her back pain is 5/10 and slumping over increases her pain. Her pain is better in the morning. Patient kyphosis with measuring from C7-T12 is 37 cm. Patient has decreased thoracic and cervical ROM. Patient has tenderness located in the cervical paraspinals, thoracici paraspinals, and upper trapezius. Patient has tightness in the pectoralis muscles, subocciptials. Bilateral shoulder flexion and abduction active ROM is limited due to her increased thoracic kyphosis. Patient has weakness in the thoracic muscles and scapula muscles. Patient will benefit from skilled therapy to reduce her pain, improve posture and increased her postural strength.    Personal Factors and Comorbidities Age;Fitness;Comorbidity 1;Time since onset of injury/illness/exacerbation    Comorbidities s/p right breast cancer    Examination-Activity Limitations Carry;Lift;Stand;Bend;Reach Overhead    Examination-Participation Restrictions Cleaning;Yard Work    Stability/Clinical Decision Making Stable/Uncomplicated    Designer, jewellery Low    Rehab Potential Excellent    PT Frequency 1x / week    PT Duration 12 weeks    PT Treatment/Interventions Electrical Stimulation;Cryotherapy;Moist Heat;Neuromuscular re-education;Therapeutic exercise;Therapeutic activities;Patient/family education;Manual techniques;Energy conservation;Dry needling;Passive range of motion;Taping;Spinal Manipulations    PT Next Visit Plan modalities as needed; dry needling to cervical and pectoralis; laying on foam roll or in sitting to increase thoracic extension, Shoulder ROM ex. for flexion and abduction    PT Home Exercise Plan Access Code: [email or web address]    Consulted and Agree  with Plan of Care Patient           Patient will benefit from skilled therapeutic intervention in order to improve the following deficits and impairments:  Increased fascial restricitons,Pain,Decreased range of motion,Decreased endurance,Decreased activity tolerance,Increased muscle spasms,Decreased strength,Decreased mobility,Postural dysfunction  Visit Diagnosis: Muscle weakness (generalized) - Plan: PT plan of care cert/re-cert  Cervicalgia - Plan: PT plan of care cert/re-cert  Pain in thoracic spine - Plan: PT plan of care cert/re-cert  Abnormal posture - Plan: PT plan of care cert/re-cert     Problem List Patient Active Problem List   Diagnosis Date Noted  . Chest pain, rule out acute myocardial infarction 02/03/2017  . Palpitations   . Hyponatremia 10/13/2012  . Acute upper respiratory infections of unspecified site 10/13/2012  . Dizziness 03/07/2011  . Shortness of breath 02/19/2011  . Nausea alone 02/19/2011  . Labile hypertension 02/19/2011  . Hypothyroidism 02/19/2011    Earlie Counts, PT 03/13/20 3:52 PM   [facility] [address] [place], [place], [address] Phone: [phone]   Fax:  [phone]  Name: Madison Nunez MRN: [id number] Date of Birth: 1946/01/21

## 2020-03-20 ENCOUNTER — Ambulatory Visit: Payer: Medicare Other | Admitting: Physical Therapy

## 2020-03-22 ENCOUNTER — Encounter: Payer: Medicare Other | Admitting: Physical Therapy

## 2020-03-30 ENCOUNTER — Ambulatory Visit: Payer: Medicare Other | Admitting: Physical Therapy

## 2020-03-30 ENCOUNTER — Other Ambulatory Visit: Payer: Self-pay

## 2020-03-30 DIAGNOSIS — M546 Pain in thoracic spine: Secondary | ICD-10-CM

## 2020-03-30 DIAGNOSIS — M6281 Muscle weakness (generalized): Secondary | ICD-10-CM | POA: Diagnosis not present

## 2020-03-30 DIAGNOSIS — R293 Abnormal posture: Secondary | ICD-10-CM

## 2020-03-30 DIAGNOSIS — M542 Cervicalgia: Secondary | ICD-10-CM

## 2020-03-30 NOTE — Patient Instructions (Signed)
Access Code: [email or web address] URL: [email or web address] Date: 03/30/2020 Prepared by: Ruben Im  Exercises Doorway Pec Stretch at 90 Degrees Abduction - 2 x daily - 7 x weekly - 1 sets - 2 reps - 15 sec hold Seated Thoracic Lumbar Extension - 2 x daily - 7 x weekly - 1 sets - 3 reps - 15 sec hold Seated Passive Cervical Retraction - 2 x daily - 7 x weekly - 1 sets - 3 reps - 10 sec hold Seated Thoracic Lumbar Extension with Pectoralis Stretch - 1 x daily - 7 x weekly - 1 sets - 10 reps Supine Shoulder Flexion with Dowel - 1 x daily - 7 x weekly - 1 sets - 10 reps Supine Chin Tuck - 1 x daily - 7 x weekly - 1 sets - 10 reps Supine Scapular Retraction - 1 x daily - 7 x weekly - 1 sets - 10 reps

## 2020-03-30 NOTE — Therapy (Signed)
[facility] [address], [place], [place], [address] Phone: [phone]   Fax:  [phone]  Physical Therapy Treatment  Patient Details  Name: Madison Nunez MRN: [id number] Date of Birth: 07-16-1945 Referring Provider (PT): Dr. Bernerd Limbo   Encounter Date: 03/30/2020   PT End of Session - 03/30/20 1722    Visit Number 2    Date for PT Re-Evaluation 06/05/20    Authorization Type [employer] medicare    PT Start Time 1536    PT Stop Time 1517    PT Time Calculation (min) 39 min    Activity Tolerance Patient tolerated treatment well;No increased pain           Past Medical History:  Diagnosis Date  . Arthritis    hands and lower back  . Breast cancer [facility]) 2005   right lumpectomy, chemotherapy with A/C combo  & Taxol, mammogram 02/06/11 without recurrence  . Dyspnea    Echocardiogram 02/20/11: EF 60-65%.;  ETT-myoveiw 12/12 normal with EF 80%  . Hypertension   . Hypothyroidism   . Personal history of chemotherapy 10/2003  . Personal history of radiation therapy 03/2004    Past Surgical History:  Procedure Laterality Date  . APPENDECTOMY  1955  . BREAST BIOPSY  08/29/2003   malignant  . BREAST LUMPECTOMY  2005   right  . TONSILLECTOMY     "as a child"    There were no vitals filed for this visit.   Subjective Assessment - 03/30/20 1537    Subjective I'm in love with that doorway stretch.  I get immediate results with that. I always have back pain all the time.  Pain across shoulder blades and upper trap regions.    Currently in Pain? Yes    Pain Score 5     Pain Location Thoracic    Pain Orientation Right;Left    Pain Type Chronic pain                             [facility] Adult PT Treatment/Exercise - 03/30/20 0001      Self-Care   Self-Care Other Self-Care Comments    Other Self-Care Comments  discussion of dry needling and benefits      Neck Exercises: Seated   Neck Retraction  5 reps    Other Seated Exercise thoracic extension with ball behind back with hands behind neck 10x    Other Seated Exercise review of initial HEP      Neck Exercises: Supine   Neck Retraction 10 reps    Other Supine Exercise palms up with deep breathing    Other Supine Exercise shoulder press down 10x      Shoulder Exercises: Supine   Other Supine Exercises cand bil shoulder flexion 10x      Manual Therapy   Joint Mobilization seated thoracic mob with movement thoracic extension 10x                  PT Education - 03/30/20 1721    Education Details added ball and hands behind head thoracic extension in sitting; supine dowel shoulder flexion; shoulder press down; dry needling info    Person(s) Educated Patient    Methods Explanation;Demonstration;Handout    Comprehension Returned demonstration;Verbalized understanding            PT Short Term Goals - 03/13/20 1543      PT SHORT TERM GOAL #1  Title independent with initial HEP    Time 4    Period Weeks    Status New    Target Date 04/10/20      PT SHORT TERM GOAL #2   Title back pain decreased >/= 25% due to understand how to correct her posture    Time 4    Period Weeks    Status New    Target Date 04/10/20      PT SHORT TERM GOAL #3   Title measuring from C7-T12 decreased from 37 cm to 36 cm due to increased in thoracic extension    Time 4    Period Weeks    Status New    Target Date 04/10/20             PT Long Term Goals - 03/13/20 1545      PT LONG TERM GOAL #1   Title independent with advanced postural exercises    Time 12    Period Weeks    Status New    Target Date 06/05/20      PT LONG TERM GOAL #2   Title increased shoulder A/ROM for flexion and abduction >/= 10 degrees due to reduction of thoracic kyphosis    Time 12    Period Weeks    Status New    Target Date 06/05/20      PT LONG TERM GOAL #3   Title thoracic and cervical pain decreased >/= 60% due to improved ROM and  postural strength    Time 12    Period Weeks    Status New    Target Date 06/05/20      PT LONG TERM GOAL #4   Title cervical ROM has improved so she is able to look upward with >/= 50% greater ease    Time 12    Period Weeks    Status New    Target Date 06/05/20      PT LONG TERM GOAL #5   Title able to turn her head to see behind herself >/= 60% greater ease    Time 12    Period Weeks    Status New    Target Date 06/05/20                 Plan - 03/30/20 1605    Clinical Impression Statement The patient demonstrates good carryover with initial HEP and particularly likes the doorway pectoral stretch.  She reports constant, unchanging mid thoracic pain and bil upper trap region pain.  Discussed DN as an option and she will consider for next time.  Very limited thoracic extension mobility but improved shoulder elevation noted in supine.  Therapist monitoring response with all.  No increase in pain level reported.    Personal Factors and Comorbidities Age;Fitness;Comorbidity 1;Time since onset of injury/illness/exacerbation    Comorbidities s/p right breast cancer    Examination-Activity Limitations Carry;Lift;Stand;Bend;Reach Overhead    Rehab Potential Excellent    PT Frequency 1x / week    PT Duration 12 weeks    PT Treatment/Interventions Electrical Stimulation;Cryotherapy;Moist Heat;Neuromuscular re-education;Therapeutic exercise;Therapeutic activities;Patient/family education;Manual techniques;Energy conservation;Dry needling;Passive range of motion;Taping;Spinal Manipulations    PT Next Visit Plan thoracic extension;  try yellow band scapular stabilization ex's in supine;  possible Dn to periscapular muscles and upper traps    PT Home Exercise Plan Access Code: [email or web address]           Patient will benefit from skilled therapeutic intervention in order to improve the following deficits  and impairments:  Increased fascial restricitons,Pain,Decreased range of motion,Decreased  endurance,Decreased activity tolerance,Increased muscle spasms,Decreased strength,Decreased mobility,Postural dysfunction  Visit Diagnosis: Muscle weakness (generalized)  Cervicalgia  Pain in thoracic spine  Abnormal posture     Problem List Patient Active Problem List   Diagnosis Date Noted  . Chest pain, rule out acute myocardial infarction 02/03/2017  . Palpitations   . Hyponatremia 10/13/2012  . Acute upper respiratory infections of unspecified site 10/13/2012  . Dizziness 03/07/2011  . Shortness of breath 02/19/2011  . Nausea alone 02/19/2011  . Labile hypertension 02/19/2011  . Hypothyroidism 02/19/2011   Ruben Im, PT 03/30/20 5:27 PM Phone: [phone] Fax: [phone] [name] 03/30/2020, 5:27 PM  [facility] [address], [place], [place], [address] Phone: [phone]   Fax:  [phone]  Name: Madison Nunez MRN: [id number] Date of Birth: [birth year]

## 2020-04-06 ENCOUNTER — Other Ambulatory Visit: Payer: Self-pay

## 2020-04-06 ENCOUNTER — Ambulatory Visit: Payer: Medicare Other | Attending: Family Medicine | Admitting: Physical Therapy

## 2020-04-06 DIAGNOSIS — M6281 Muscle weakness (generalized): Secondary | ICD-10-CM

## 2020-04-06 DIAGNOSIS — M542 Cervicalgia: Secondary | ICD-10-CM

## 2020-04-06 DIAGNOSIS — M546 Pain in thoracic spine: Secondary | ICD-10-CM | POA: Diagnosis present

## 2020-04-06 DIAGNOSIS — R293 Abnormal posture: Secondary | ICD-10-CM | POA: Diagnosis present

## 2020-04-06 NOTE — Patient Instructions (Addendum)
Over Head Pull: Narrow AND WIDE Grip       On back, knees bent, feet flat, band across thighs, elbows straight but relaxed. Pull hands apart (start). Keeping elbows straight, bring arms up and over head, hands toward floor. Keep pull steady on band. Hold momentarily. Return slowly, keeping pull steady, back to start. Repeat _5__ times. Band color __red____   Side Pull: Double Arm   On back, knees bent, feet flat. Arms perpendicular to body, shoulder level, elbows straight but relaxed. Pull arms out to sides, elbows straight. Resistance band comes across collarbones, hands toward floor. Hold momentarily. Slowly return to starting position. Repeat 5___ times. Band color ___RED__   Sash   On back, knees bent, feet flat, left hand on left hip, right hand above left. Pull right arm DIAGONALLY (hip to shoulder) across chest. Bring right arm along head toward floor. Hold momentarily. Slowly return to starting position. Repeat _5_ times. Do with left arm. Band color ___RED___   Shoulder Rotation: Double Arm   On back, knees bent, feet flat, elbows tucked at sides, bent 90, hands palms up. Pull hands apart and down toward floor, keeping elbows near sides. Hold momentarily. Slowly return to starting position. Repeat __5_ times. Band color __RED    Ruben Im PT [facility] [address] [place], [place] [address] Phone # [phone] Fax [phone]____     Access Code: [email or web address] URL: [email or web address] Date: 04/06/2020 Prepared by: Ruben Im  Exercises Doorway Pec Stretch at 90 Degrees Abduction - 2 x daily - 7 x weekly - 1 sets - 2 reps - 15 sec hold Seated Thoracic Lumbar Extension - 2 x daily - 7 x weekly - 1 sets - 3 reps - 15 sec hold Seated Passive Cervical Retraction - 2 x daily - 7 x weekly - 1 sets - 3 reps - 10 sec hold Seated Thoracic Lumbar Extension with Pectoralis Stretch - 1 x daily - 7 x weekly - 1 sets - 10  reps Supine Shoulder Flexion with Dowel - 1 x daily - 7 x weekly - 1 sets - 10 reps Supine Chin Tuck - 1 x daily - 7 x weekly - 1 sets - 10 reps Supine Scapular Retraction - 1 x daily - 7 x weekly - 1 sets - 10 reps Standing Row with Anchored Resistance - 1 x daily - 7 x weekly - 1 sets - 10 reps Shoulder Extension with Resistance - 1 x daily - 7 x weekly - 1 sets - 10 reps Push-Up on Counter - 1 x daily - 7 x weekly - 1 sets - 10 reps

## 2020-04-06 NOTE — Therapy (Signed)
[facility] [address], [place], [place], [address] Phone: [phone]   Fax:  [phone]  Physical Therapy Treatment  Patient Details  Name: OWEDA DANOFF MRN: [id number] Date of Birth: 08-25-1945 Referring Provider (PT): Dr. Bernerd Limbo   Encounter Date: 04/06/2020   PT End of Session - 04/06/20 1010    Visit Number 3    Date for PT Re-Evaluation 06/05/20    Authorization Type [employer] medicare    PT Start Time 0930    PT Stop Time 1012    PT Time Calculation (min) 42 min    Activity Tolerance Patient tolerated treatment well           Past Medical History:  Diagnosis Date  . Arthritis    hands and lower back  . Breast cancer [facility]) 2005   right lumpectomy, chemotherapy with A/C combo  & Taxol, mammogram 02/06/11 without recurrence  . Dyspnea    Echocardiogram 02/20/11: EF 60-65%.;  ETT-myoveiw 12/12 normal with EF 80%  . Hypertension   . Hypothyroidism   . Personal history of chemotherapy 10/2003  . Personal history of radiation therapy 03/2004    Past Surgical History:  Procedure Laterality Date  . APPENDECTOMY  1955  . BREAST BIOPSY  08/29/2003   malignant  . BREAST LUMPECTOMY  2005   right  . TONSILLECTOMY     "as a child"    There were no vitals filed for this visit.   Subjective Assessment - 04/06/20 0933    Subjective I feel Ok.   A little dull ache.  My exercises are kicking in.  I don't think I need the dry needling.    Patient Stated Goals learn how to make back feel better.    Currently in Pain? Yes    Pain Score 4     Pain Location Thoracic    Pain Type Chronic pain                             [facility] Adult PT Treatment/Exercise - 04/06/20 0001      Neck Exercises: Supine   Neck Retraction 5 reps    Other Supine Exercise palms up with deep breathing    Other Supine Exercise shoulder press down 10x      Lumbar Exercises: Aerobic   Nustep L1 7 min       Shoulder Exercises: Supine   Other Supine Exercises red band; overhead narrow and wide grip; horizontal abduction; sash; external rotation 5x 2 sets      Shoulder Exercises: Standing   Extension Strengthening;Both;10 reps    Theraband Level (Shoulder Extension) Level 2 (Red)    Row Strengthening;Both;10 reps    Theraband Level (Shoulder Row) Level 2 (Red)    Other Standing Exercises wall push ups 10x    Other Standing Exercises counter top push ups 10x                    PT Short Term Goals - 03/13/20 1543      PT SHORT TERM GOAL #1   Title independent with initial HEP    Time 4    Period Weeks    Status New    Target Date 04/10/20      PT SHORT TERM GOAL #2   Title back pain decreased >/= 25% due to understand how to correct her posture    Time 4  Period Weeks    Status New    Target Date 04/10/20      PT SHORT TERM GOAL #3   Title measuring from C7-T12 decreased from 37 cm to 36 cm due to increased in thoracic extension    Time 4    Period Weeks    Status New    Target Date 04/10/20             PT Long Term Goals - 03/13/20 1545      PT LONG TERM GOAL #1   Title independent with advanced postural exercises    Time 12    Period Weeks    Status New    Target Date 06/05/20      PT LONG TERM GOAL #2   Title increased shoulder A/ROM for flexion and abduction >/= 10 degrees due to reduction of thoracic kyphosis    Time 12    Period Weeks    Status New    Target Date 06/05/20      PT LONG TERM GOAL #3   Title thoracic and cervical pain decreased >/= 60% due to improved ROM and postural strength    Time 12    Period Weeks    Status New    Target Date 06/05/20      PT LONG TERM GOAL #4   Title cervical ROM has improved so she is able to look upward with >/= 50% greater ease    Time 12    Period Weeks    Status New    Target Date 06/05/20      PT LONG TERM GOAL #5   Title able to turn her head to see behind herself >/= 60% greater ease     Time 12    Period Weeks    Status New    Target Date 06/05/20                 Plan - 04/06/20 1008    Clinical Impression Statement Increased LBP with lying supine on large mat table.  Relieved with standing band ex's/change of position.  She also finds the Nu-Step to be pain relieving as well.  She hopes to control her pain with exercise and not need dry needling.  Therapist monitoring response and modifying as needed secondary to pain.    Comorbidities s/p right breast cancer    Rehab Potential Excellent    PT Frequency 1x / week    PT Duration 12 weeks    PT Treatment/Interventions Electrical Stimulation;Cryotherapy;Moist Heat;Neuromuscular re-education;Therapeutic exercise;Therapeutic activities;Patient/family education;Manual techniques;Energy conservation;Dry needling;Passive range of motion;Taping;Spinal Manipulations    PT Next Visit Plan hold supine mat table ex's secondary to increased low back pain;  standing red band row and extension review; Nu-Step; counter push ups; try wall ex's;  check STGS next week    PT Home Exercise Plan Access Code: [email or web address]           Patient will benefit from skilled therapeutic intervention in order to improve the following deficits and impairments:  Increased fascial restricitons,Pain,Decreased range of motion,Decreased endurance,Decreased activity tolerance,Increased muscle spasms,Decreased strength,Decreased mobility,Postural dysfunction  Visit Diagnosis: Muscle weakness (generalized)  Cervicalgia  Pain in thoracic spine  Abnormal posture     Problem List Patient Active Problem List   Diagnosis Date Noted  . Chest pain, rule out acute myocardial infarction 02/03/2017  . Palpitations   . Hyponatremia 10/13/2012  . Acute upper respiratory infections of unspecified site 10/13/2012  . Dizziness 03/07/2011  . Shortness  of breath 02/19/2011  . Nausea alone 02/19/2011  . Labile hypertension 02/19/2011  . Hypothyroidism  02/19/2011   Ruben Im, PT 04/06/20 5:48 PM Phone: [phone] Fax: [phone] [name] 04/06/2020, 5:48 PM  [facility] [address], [place], [place], [address] Phone: [phone]   Fax:  [phone]  Name: LETY CULLENS MRN: [id number] Date of Birth: [birth year]

## 2020-04-13 ENCOUNTER — Ambulatory Visit: Payer: Medicare Other | Admitting: Physical Therapy

## 2020-04-13 ENCOUNTER — Other Ambulatory Visit: Payer: Self-pay

## 2020-04-13 DIAGNOSIS — R293 Abnormal posture: Secondary | ICD-10-CM

## 2020-04-13 DIAGNOSIS — M546 Pain in thoracic spine: Secondary | ICD-10-CM

## 2020-04-13 DIAGNOSIS — M6281 Muscle weakness (generalized): Secondary | ICD-10-CM | POA: Diagnosis not present

## 2020-04-13 DIAGNOSIS — M542 Cervicalgia: Secondary | ICD-10-CM

## 2020-04-13 NOTE — Therapy (Signed)
[facility] [address] [place], [place], [address] Phone: [phone]   Fax:  [phone]  Physical Therapy Treatment  Patient Details  Name: Madison Nunez MRN: [id number] Date of Birth: 1945/03/23 Referring Provider (PT): Dr. Bernerd Limbo   Encounter Date: 04/13/2020   PT End of Session - 04/13/20 1302    Visit Number 4    Date for PT Re-Evaluation 06/05/20    Authorization Type [employer] medicare    PT Start Time 0930    PT Stop Time 1015    PT Time Calculation (min) 45 min    Activity Tolerance Patient tolerated treatment well           Past Medical History:  Diagnosis Date  . Arthritis    hands and lower back  . Breast cancer [facility]) 2005   right lumpectomy, chemotherapy with A/C combo  & Taxol, mammogram 02/06/11 without recurrence  . Dyspnea    Echocardiogram 02/20/11: EF 60-65%.;  ETT-myoveiw 12/12 normal with EF 80%  . Hypertension   . Hypothyroidism   . Personal history of chemotherapy 10/2003  . Personal history of radiation therapy 03/2004    Past Surgical History:  Procedure Laterality Date  . APPENDECTOMY  1955  . BREAST BIOPSY  08/29/2003   malignant  . BREAST LUMPECTOMY  2005   right  . TONSILLECTOMY     "as a child"    There were no vitals filed for this visit.   Subjective Assessment - 04/13/20 0933    Subjective I pulled a muscle in my arm.  My thoracic pain is getting better.    Pertinent History I had some vein work recently in my legs    Patient Stated Goals learn how to make back feel better.    Currently in Pain? Yes    Pain Score 2     Pain Location Thoracic    Pain Orientation Mid    Pain Type Chronic pain    Pain Onset More than a month ago              [facility] PT Assessment - 04/13/20 0001      Posture/Postural Control   Posture Comments measure from C7 to T12 36 cm   pain with spinal elongation                        [facility] Adult PT  Treatment/Exercise - 04/13/20 0001      Neck Exercises: Seated   Other Seated Exercise thoracic extension with rolled towel in thoracic spine 10x      Lumbar Exercises: Aerobic   Nustep L1 seat 8 5 min while discussing progress      Shoulder Exercises: Supine   Other Supine Exercises red band; overhead narrow and wide grip; horizontal abduction; sash; external rotation 5x 2 sets      Shoulder Exercises: Standing   Extension Strengthening;Both;10 reps    Theraband Level (Shoulder Extension) Level 2 (Red)    Extension Limitations single arm with handles    Row Strengthening;Both;10 reps    Theraband Level (Shoulder Row) Level 2 (Red)    Row Limitations with handles    Shoulder Elevation Limitations towel slide on wall 5x    Other Standing Exercises wall with towel roll: UE flexion, snow angels, isometric press back    Other Standing Exercises counter top push ups 10x  PT Education - 04/13/20 1301    Education Details wall snow angels and UE elevation; facing wall towel slides up wall    Person(s) Educated Patient    Methods Explanation;Demonstration;Handout    Comprehension Returned demonstration;Verbalized understanding            PT Short Term Goals - 04/13/20 1310      PT SHORT TERM GOAL #1   Title independent with initial HEP    Status Achieved      PT SHORT TERM GOAL #2   Title back pain decreased >/= 25% due to understand how to correct her posture    Status Achieved      PT SHORT TERM GOAL #3   Title measuring from C7-T12 decreased from 37 cm to 36 cm due to increased in thoracic extension    Status Achieved             PT Long Term Goals - 03/13/20 1545      PT LONG TERM GOAL #1   Title independent with advanced postural exercises    Time 12    Period Weeks    Status New    Target Date 06/05/20      PT LONG TERM GOAL #2   Title increased shoulder A/ROM for flexion and abduction >/= 10 degrees due to reduction of thoracic  kyphosis    Time 12    Period Weeks    Status New    Target Date 06/05/20      PT LONG TERM GOAL #3   Title thoracic and cervical pain decreased >/= 60% due to improved ROM and postural strength    Time 12    Period Weeks    Status New    Target Date 06/05/20      PT LONG TERM GOAL #4   Title cervical ROM has improved so she is able to look upward with >/= 50% greater ease    Time 12    Period Weeks    Status New    Target Date 06/05/20      PT LONG TERM GOAL #5   Title able to turn her head to see behind herself >/= 60% greater ease    Time 12    Period Weeks    Status New    Target Date 06/05/20                 Plan - 04/13/20 1010    Clinical Impression Statement The patient rates her overall progress at 40%.  She has decreased thoracic kyphosis and/or spinal elongation in standing with C7-T12 measurement improved by 1 cm.  She is able to perform seated and standing ex's much better without an increase in pain compared to increased pain with supine on mat table last visit.  She is provided with handles for her resisted band for added comfort with rows and extensions.  Therapist monitoring pain and providing verbal cues for postural alignment.  All STGs met.    Personal Factors and Comorbidities Age;Fitness;Comorbidity 1;Time since onset of injury/illness/exacerbation    Comorbidities s/p right breast cancer    Examination-Activity Limitations Carry;Lift;Stand;Bend;Reach Overhead    Rehab Potential Excellent    PT Frequency 1x / week    PT Duration 12 weeks    PT Treatment/Interventions Electrical Stimulation;Cryotherapy;Moist Heat;Neuromuscular re-education;Therapeutic exercise;Therapeutic activities;Patient/family education;Manual techniques;Energy conservation;Dry needling;Passive range of motion;Taping;Spinal Manipulations    PT Next Visit Plan hold supine mat table ex's secondary to increased low back pain;  standing red  band row and extension review; Nu-Step;  counter push ups; try wall ex's;  check STGS next week    PT Home Exercise Plan Access Code: [email or web address]           Patient will benefit from skilled therapeutic intervention in order to improve the following deficits and impairments:  Increased fascial restricitons,Pain,Decreased range of motion,Decreased endurance,Decreased activity tolerance,Increased muscle spasms,Decreased strength,Decreased mobility,Postural dysfunction  Visit Diagnosis: Muscle weakness (generalized)  Cervicalgia  Pain in thoracic spine  Abnormal posture     Problem List Patient Active Problem List   Diagnosis Date Noted  . Chest pain, rule out acute myocardial infarction 02/03/2017  . Palpitations   . Hyponatremia 10/13/2012  . Acute upper respiratory infections of unspecified site 10/13/2012  . Dizziness 03/07/2011  . Shortness of breath 02/19/2011  . Nausea alone 02/19/2011  . Labile hypertension 02/19/2011  . Hypothyroidism 02/19/2011   Ruben Im, PT 04/13/20 1:11 PM Phone: [phone] Fax: [phone] [name] 04/13/2020, 1:11 PM  [facility] [address] [place], [place], [address] Phone: [phone]   Fax:  [phone]  Name: Madison Nunez MRN: [id number] Date of Birth: [birth year]

## 2020-04-13 NOTE — Patient Instructions (Signed)
Access Code: [email or web address] URL: [email or web address] Date: 04/13/2020 Prepared by: Ruben Im  Exercises Doorway Pec Stretch at 90 Degrees Abduction - 2 x daily - 7 x weekly - 1 sets - 2 reps - 15 sec hold Seated Thoracic Lumbar Extension - 2 x daily - 7 x weekly - 1 sets - 3 reps - 15 sec hold Seated Passive Cervical Retraction - 2 x daily - 7 x weekly - 1 sets - 3 reps - 10 sec hold Seated Thoracic Lumbar Extension with Pectoralis Stretch - 1 x daily - 7 x weekly - 1 sets - 10 reps Supine Shoulder Flexion with Dowel - 1 x daily - 7 x weekly - 1 sets - 10 reps Supine Chin Tuck - 1 x daily - 7 x weekly - 1 sets - 10 reps Supine Scapular Retraction - 1 x daily - 7 x weekly - 1 sets - 10 reps Standing Row with Anchored Resistance - 1 x daily - 7 x weekly - 1 sets - 10 reps Shoulder Extension with Resistance - 1 x daily - 7 x weekly - 1 sets - 10 reps Push-Up on Counter - 1 x daily - 7 x weekly - 1 sets - 10 reps Shoulder Flexion at Wall - 1 x daily - 7 x weekly - 1 sets - 10 reps Wall Angels - 1 x daily - 7 x weekly - 1 sets - 10 reps Standing shoulder flexion wall slides - 1 x daily - 7 x weekly - 1 sets - 10 reps

## 2020-04-15 ENCOUNTER — Other Ambulatory Visit: Payer: Self-pay

## 2020-04-15 ENCOUNTER — Emergency Department (HOSPITAL_COMMUNITY)
Admission: EM | Admit: 2020-04-15 | Discharge: 2020-04-15 | Disposition: A | Discharge status: Home / Self Care | Payer: Medicare Other | Attending: Emergency Medicine | Admitting: Emergency Medicine

## 2020-04-15 ENCOUNTER — Emergency Department (HOSPITAL_COMMUNITY): Payer: Medicare Other

## 2020-04-15 DIAGNOSIS — R002 Palpitations: Secondary | ICD-10-CM | POA: Diagnosis not present

## 2020-04-15 DIAGNOSIS — Z79899 Other long term (current) drug therapy: Secondary | ICD-10-CM | POA: Diagnosis not present

## 2020-04-15 DIAGNOSIS — I480 Paroxysmal atrial fibrillation: Secondary | ICD-10-CM

## 2020-04-15 DIAGNOSIS — Z87891 Personal history of nicotine dependence: Secondary | ICD-10-CM | POA: Diagnosis not present

## 2020-04-15 DIAGNOSIS — Z853 Personal history of malignant neoplasm of breast: Secondary | ICD-10-CM | POA: Diagnosis not present

## 2020-04-15 DIAGNOSIS — Z7982 Long term (current) use of aspirin: Secondary | ICD-10-CM | POA: Diagnosis not present

## 2020-04-15 DIAGNOSIS — I10 Essential (primary) hypertension: Secondary | ICD-10-CM | POA: Diagnosis not present

## 2020-04-15 DIAGNOSIS — E039 Hypothyroidism, unspecified: Secondary | ICD-10-CM | POA: Insufficient documentation

## 2020-04-15 LAB — BASIC METABOLIC PANEL
Anion gap: 12 (ref 5–15)
BUN: 15 mg/dL (ref 8–23)
CO2: 25 mmol/L (ref 22–32)
Calcium: 9.4 mg/dL (ref 8.9–10.3)
Chloride: 101 mmol/L (ref 98–111)
Creatinine, Ser: 0.69 mg/dL (ref 0.44–1.00)
GFR, Estimated: >60 mL/min (ref >60)
Glucose, Bld: 114 mg/dL — ABNORMAL HIGH (ref 70–99)
Potassium: 3.7 mmol/L (ref 3.5–5.1)
Sodium: 138 mmol/L (ref 135–145)

## 2020-04-15 LAB — CBC
HCT: 44.7 % (ref 36.0–46.0)
Hemoglobin: 15 g/dL (ref 12.0–15.0)
MCH: 31.9 pg (ref 26.0–34.0)
MCHC: 33.6 g/dL (ref 30.0–36.0)
MCV: 95.1 fL (ref 80.0–100.0)
Platelets: 234 10*3/uL (ref 150–400)
RBC: 4.7 MIL/uL (ref 3.87–5.11)
RDW: 11.6 % (ref 11.5–15.5)
WBC: 4.8 10*3/uL (ref 4.0–10.5)
nRBC: 0 % (ref 0.0–0.2)

## 2020-04-15 NOTE — Discharge Instructions (Addendum)
Follow-up with Dr. Debara Pickett in the next few days.  His contact information has been provided in this discharge summary for you to call and make these arrangements.  Return to the emergency department in the meantime if you develop worsening of symptoms, severe chest pain, difficulty breathing, or other new and concerning symptoms.

## 2020-04-15 NOTE — ED Triage Notes (Signed)
Pt presents to ED POV. Pt c/o palpitation that began around 0130. Pt woken up from sleep d/t feeling. Pt reports also happened on Wednesday. Seen by cardiologist and cleared. Pt also reports SOB.

## 2020-04-15 NOTE — ED Provider Notes (Signed)
[facility] EMERGENCY DEPARTMENT Provider Note   CSN: [id number] Arrival date & time: 04/15/20  1749     History Chief Complaint  Patient presents with  . Palpitations    Madison Nunez is a 75 y.o. female.  Patient is a 75 year old female with history of hypertension, hypothyroidism presenting with complaints of palpitations.  Patient has had episodes of rapid, irregular heartbeat that has been recurring over the past 2 months.  She had an episode on Wednesday and was seen by her primary doctor, however about the time she got there the arrhythmia had resolved.  Patient experienced another episode this evening and presents for evaluation of it.  She arrives here in atrial fibrillation with rapid ventricular response.  She is denying any chest pain or difficulty breathing.  The history is provided by the patient.  Palpitations Palpitations quality:  Irregular Timing:  Intermittent Progression:  Worsening Chronicity:  New Relieved by:  Nothing Worsened by:  Nothing Ineffective treatments:  None tried      Past Medical History:  Diagnosis Date  . Arthritis    hands and lower back  . Breast cancer [facility]) 2005   right lumpectomy, chemotherapy with A/C combo  & Taxol, mammogram 02/06/11 without recurrence  . Dyspnea    Echocardiogram 02/20/11: EF 60-65%.;  ETT-myoveiw 12/12 normal with EF 80%  . Hypertension   . Hypothyroidism   . Personal history of chemotherapy 10/2003  . Personal history of radiation therapy 03/2004    Patient Active Problem List   Diagnosis Date Noted  . Chest pain, rule out acute myocardial infarction 02/03/2017  . Palpitations   . Hyponatremia 10/13/2012  . Acute upper respiratory infections of unspecified site 10/13/2012  . Dizziness 03/07/2011  . Shortness of breath 02/19/2011  . Nausea alone 02/19/2011  . Labile hypertension 02/19/2011  . Hypothyroidism 02/19/2011    Past Surgical History:  Procedure Laterality Date  .  APPENDECTOMY  1955  . BREAST BIOPSY  08/29/2003   malignant  . BREAST LUMPECTOMY  2005   right  . TONSILLECTOMY     "as a child"     OB History   No obstetric history on file.     Family History  Problem Relation Age of Onset  . Coronary artery disease Brother 33       died of first MI at 28  . Coronary artery disease Father        had juvenile DM most of his life, had 3 MI's the first in his early 48's, died at 31  . Diabetes Father   . Breast cancer Maternal Grandmother     Social History   Tobacco Use  . Smoking status: Former Smoker    Packs/day: 1.00    Years: 5.00    Pack years: 5.00    Types: Cigarettes  . Smokeless tobacco: Never Used  Substance Use Topics  . Alcohol use: Yes    Alcohol/week: 7.0 standard drinks    Types: 7 Glasses of wine per week  . Drug use: No    Home Medications Prior to Admission medications   Medication Sig Start Date End Date Taking? Authorizing Provider  ascorbic Acid (VITAMIN C) 500 MG CPCR Take by mouth.    [provider]  aspirin EC 81 MG tablet Take 81 mg by mouth daily.     [provider]  Calcium Carbonate-Vitamin D (CALTRATE 600+D PO) Take 1 tablet by mouth 2 (two) times daily.  [provider]  cholecalciferol (VITAMIN D) 1000 UNITS tablet Take 1,000 Units by mouth daily.    [provider]  Flaxseed Oil (LINSEED OIL) OIL Take by mouth.    [provider]  latanoprost (XALATAN) 0.005 % ophthalmic solution 1 drop at bedtime. 06/25/19   [provider]  levothyroxine (SYNTHROID, LEVOTHROID) 50 MCG tablet Take 50 mcg by mouth daily.     [provider]  lisinopril (PRINIVIL,ZESTRIL) 40 MG tablet Take 1 tablet (40 mg total) by mouth daily. Patient taking differently: Take 40 mg by mouth every evening.  10/14/12   Janece Canterbury, MD  Misc Natural Products (TURMERIC CURCUMIN) CAPS Take by mouth.    [provider]  Multiple Vitamins-Minerals  (MULTIVITAMINS THER. W/MINERALS) TABS Take 1 tablet by mouth daily.      [provider]  TURMERIC PO Take 1 capsule by mouth daily.    [provider]    Allergies    Hctz [hydrochlorothiazide], Beta adrenergic blockers, and Prednisone  Review of Systems   Review of Systems  Cardiovascular: Positive for palpitations.  All other systems reviewed and are negative.   Physical Exam Updated Vital Signs BP 117/68   Pulse 74   Temp 99 F (37.2 C) (Oral)   Resp 13   LMP 02/19/2011   SpO2 96%   Physical Exam Vitals and nursing note reviewed.  Constitutional:      General: She is not in acute distress.    Appearance: She is well-developed and well-nourished. She is not diaphoretic.  HENT:     Head: Normocephalic and atraumatic.  Cardiovascular:     Rate and Rhythm: Normal rate and regular rhythm.     Heart sounds: No murmur heard. No friction rub. No gallop.   Pulmonary:     Effort: Pulmonary effort is normal. No respiratory distress.     Breath sounds: Normal breath sounds. No wheezing.  Abdominal:     General: Bowel sounds are normal. There is no distension.     Palpations: Abdomen is soft.     Tenderness: There is no abdominal tenderness.  Musculoskeletal:        General: No swelling or tenderness. Normal range of motion.     Cervical back: Normal range of motion and neck supple.     Right lower leg: No edema.     Left lower leg: No edema.  Skin:    General: Skin is warm and dry.  Neurological:     Mental Status: She is alert and oriented to person, place, and time.     ED Results / Procedures / Treatments   Labs (all labs ordered are listed, but only abnormal results are displayed) Labs Reviewed  BASIC METABOLIC PANEL - Abnormal; Notable for the following components:      Result Value   Glucose, Bld 114 (*)    All other components within normal limits  CBC    EKG ED ECG REPORT   Date: 04/15/2020  Rate: 145  Rhythm: atrial  fibrillation  QRS Axis: left  Intervals: normal  ST/T Wave abnormalities: nonspecific T wave changes  Conduction Disutrbances:none  Narrative Interpretation:   Old EKG Reviewed: changes noted  I have personally reviewed the EKG tracing and agree with the computerized printout as noted.   Radiology DG Chest Portable 1 View  Result Date: 04/15/2020 CLINICAL DATA:  Chest pain, palpitations. EXAM: PORTABLE CHEST 1 VIEW COMPARISON:  Chest x-ray 02/02/2017, CT chest 12/05/2009 FINDINGS: Redemonstration of a left Port-A-Cath  with tip in similar position overlying the mid superior vena cava. The heart size and mediastinal contours are unchanged. Linear atelectasis versus scarring at the left base. No focal consolidation. No pulmonary edema. No pleural effusion. No pneumothorax. No acute osseous abnormality. IMPRESSION: No active disease. Electronically Signed   By: Iven Finn M.D.   On: 04/15/2020 03:26    Procedures Procedures   Medications Ordered in ED Medications - No data to display  ED Course  I have reviewed the triage vital signs and the nursing notes.  Pertinent labs & imaging results that were available during my care of the patient were reviewed by me and considered in my medical decision making (see chart for details).    MDM Rules/Calculators/A&P  Patient is a 75 year old female presenting with complaints of palpitations.  She arrived here in atrial fibrillation with RVR.  She has had episodes like this, however the rhythm has never been identified.  Patient's laboratory studies are unremarkable and she has since converted back to sinus rhythm.  She has no complaints at present.  She has been seen by Dr. Debara Pickett in the past.  I have advised her to follow-up with him later this week.  I did speak with the cardiology fellow on-call who did not feel as though hospitalization or anticoagulation was indicated until further studies have been performed.  Final Clinical  Impression(s) / ED Diagnoses Final diagnoses:  None    Rx / DC Orders ED Discharge Orders    None       Veryl Speak, MD 04/15/20 [phone]

## 2020-04-17 ENCOUNTER — Ambulatory Visit: Payer: Medicare Other | Admitting: General Practice

## 2020-04-17 ENCOUNTER — Encounter: Payer: Self-pay | Admitting: General Practice

## 2020-04-17 ENCOUNTER — Other Ambulatory Visit: Payer: Self-pay

## 2020-04-17 VITALS — BP 149/88 | HR 88 | Ht 64.0 in | Wt 158.6 lb

## 2020-04-17 DIAGNOSIS — I480 Paroxysmal atrial fibrillation: Secondary | ICD-10-CM | POA: Diagnosis not present

## 2020-04-17 DIAGNOSIS — I10 Essential (primary) hypertension: Secondary | ICD-10-CM | POA: Diagnosis not present

## 2020-04-17 MED ORDER — APIXABAN 5 MG PO TABS
5.0000 mg | ORAL_TABLET | Freq: Two times a day (BID) | ORAL | 6 refills | Status: DC
Start: 2020-04-17 — End: 2020-11-14

## 2020-04-17 NOTE — Patient Instructions (Signed)
Medication Instructions:  Start ELIQUIS 5MG  TWICE DAILY *If you need a refill on your cardiac medications before your next appointment, please call your pharmacy*  Lab Work:   Testing/Procedures:  NONE    NONE  Special Instructions Please try to avoid these triggers:  Do not use any products that have nicotine or tobacco in them. These include cigarettes, e-cigarettes, and chewing tobacco. If you need help quitting, ask your doctor.  Eat heart-healthy foods. Talk with your doctor about the right eating plan for you.  Exercise regularly as told by your doctor.  Stay hydrated  Do not drink alcohol, Caffeine or chocolate.  Lose weight if you are overweight.  Do not use drugs, including cannabis  TAKE AND LOG YOUR BLOOD PRESSURE   Follow-Up: Your next appointment:  3 month(s) In Person with K. Mali Hilty, MD OR IF UNAVAILABLE [place], FNP-C   At [facility], you and your health needs are our priority.  As part of our continuing mission to provide you with exceptional heart care, we have created designated Provider Care Teams.  These Care Teams include your primary Cardiologist (physician) and Advanced Practice Providers (APPs -  Physician Assistants and Nurse Practitioners) who all work together to provide you with the care you need, when you need it.  We recommend signing up for the patient portal called "MyChart".  Sign up information is provided on this After Visit Summary.  MyChart is used to connect with patients for Virtual Visits (Telemedicine).  Patients are able to view lab/test results, encounter notes, upcoming appointments, etc.  Non-urgent messages can be sent to your provider as well.   To learn more about what you can do with MyChart, go to [email or web address].
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
## 2020-04-20 ENCOUNTER — Ambulatory Visit: Payer: Medicare Other | Admitting: Physical Therapy

## 2020-04-20 ENCOUNTER — Other Ambulatory Visit: Payer: Self-pay

## 2020-04-20 DIAGNOSIS — R293 Abnormal posture: Secondary | ICD-10-CM

## 2020-04-20 DIAGNOSIS — M6281 Muscle weakness (generalized): Secondary | ICD-10-CM | POA: Diagnosis not present

## 2020-04-20 DIAGNOSIS — M542 Cervicalgia: Secondary | ICD-10-CM

## 2020-04-20 DIAGNOSIS — M546 Pain in thoracic spine: Secondary | ICD-10-CM

## 2020-04-20 NOTE — Patient Instructions (Signed)
Access Code: [email or web address] URL: [email or web address] Date: 04/20/2020 Prepared by: Ruben Im  Exercises Doorway Pec Stretch at 90 Degrees Abduction - 2 x daily - 7 x weekly - 1 sets - 2 reps - 15 sec hold Seated Thoracic Lumbar Extension - 2 x daily - 7 x weekly - 1 sets - 3 reps - 15 sec hold Seated Passive Cervical Retraction - 2 x daily - 7 x weekly - 1 sets - 3 reps - 10 sec hold Seated Thoracic Lumbar Extension with Pectoralis Stretch - 1 x daily - 7 x weekly - 1 sets - 10 reps Supine Shoulder Flexion with Dowel - 1 x daily - 7 x weekly - 1 sets - 10 reps Supine Chin Tuck - 1 x daily - 7 x weekly - 1 sets - 10 reps Supine Scapular Retraction - 1 x daily - 7 x weekly - 1 sets - 10 reps Standing Row with Anchored Resistance - 1 x daily - 7 x weekly - 1 sets - 10 reps Shoulder Extension with Resistance - 1 x daily - 7 x weekly - 1 sets - 10 reps Push-Up on Counter - 1 x daily - 7 x weekly - 1 sets - 10 reps Shoulder Flexion at Wall - 1 x daily - 7 x weekly - 1 sets - 10 reps Wall Angels - 1 x daily - 7 x weekly - 1 sets - 10 reps Standing shoulder flexion wall slides - 1 x daily - 7 x weekly - 1 sets - 10 reps Hip Flexor Stretch on Step - 1 x daily - 7 x weekly - 2 sets - 10 reps

## 2020-04-20 NOTE — Therapy (Signed)
[facility] [address] [place], [place], [address] Phone: [phone]   Fax:  [phone]  Physical Therapy Treatment  Patient Details  Name: Madison Nunez MRN: [id number] Date of Birth: 11-24-1945 Referring Provider (PT): Dr. Bernerd Limbo   Encounter Date: 04/20/2020   PT End of Session - 04/20/20 1701    Visit Number 5    Date for PT Re-Evaluation 06/05/20    Authorization Type [employer] medicare    PT Start Time 0930    PT Stop Time 1014    PT Time Calculation (min) 44 min    Activity Tolerance Patient tolerated treatment well           Past Medical History:  Diagnosis Date  . Arthritis    hands and lower back  . Breast cancer [facility]) 2005   right lumpectomy, chemotherapy with A/C combo  & Taxol, mammogram 02/06/11 without recurrence  . Dyspnea    Echocardiogram 02/20/11: EF 60-65%.;  ETT-myoveiw 12/12 normal with EF 80%  . Hypertension   . Hypothyroidism   . Personal history of chemotherapy 10/2003  . Personal history of radiation therapy 03/2004    Past Surgical History:  Procedure Laterality Date  . APPENDECTOMY  1955  . BREAST BIOPSY  08/29/2003   malignant  . BREAST LUMPECTOMY  2005   right  . TONSILLECTOMY     "as a child"    There were no vitals filed for this visit.   Subjective Assessment - 04/20/20 0937    Subjective I had a health event but I don't want to talk about it.  I can continue with my regular workouts.    Pertinent History I had some vein work recently in my legs    Patient Stated Goals learn how to make back feel better.    Currently in Pain? No/denies    Pain Score 0-No pain    Pain Location Thoracic              [facility] PT Assessment - 04/20/20 0001      AROM   Right Shoulder Flexion 140 Degrees    Right Shoulder ABduction 175 Degrees    Left Shoulder Flexion 165 Degrees    Left Shoulder ABduction 157 Degrees    Cervical Flexion 64    Cervical Extension 43     Cervical - Right Side Bend 24    Cervical - Left Side Bend 20    Cervical - Right Rotation 40    Cervical - Left Rotation 40                         [facility] Adult PT Treatment/Exercise - 04/20/20 0001      Neck Exercises: Seated   Other Seated Exercise --      Lumbar Exercises: Stretches   Other Lumbar Stretch Exercise 1st step psoas with UE reaches 2x5 right/left      Lumbar Exercises: Aerobic   Nustep L1 seat 8 5 min while discussing progress      Shoulder Exercises: Supine   Other Supine Exercises red band; overhead narrow and wide grip; horizontal abduction; sash; external rotation 5x 2 sets      Shoulder Exercises: Standing   Extension Strengthening;Both;15 reps    Theraband Level (Shoulder Extension) Level 2 (Red)    Extension Limitations bil    Row Strengthening;Both;15 reps    Theraband Level (Shoulder Row) Level 2 (Red)    Row Limitations  with handles    Shoulder Elevation Limitations towel slide on wall 10x    Other Standing Exercises wall with towel roll: UE flexion, snow angels, isometric press back; alligators 10x each    Other Standing Exercises counter top push ups 15x                  PT Education - 04/20/20 1705    Education Details hip flexor stretch on step with UE elevation    Person(s) Educated Patient    Methods Explanation;Demonstration;Handout    Comprehension Returned demonstration;Verbalized understanding            PT Short Term Goals - 04/13/20 1310      PT SHORT TERM GOAL #1   Title independent with initial HEP    Status Achieved      PT SHORT TERM GOAL #2   Title back pain decreased >/= 25% due to understand how to correct her posture    Status Achieved      PT SHORT TERM GOAL #3   Title measuring from C7-T12 decreased from 37 cm to 36 cm due to increased in thoracic extension    Status Achieved             PT Long Term Goals - 04/20/20 0959      PT LONG TERM GOAL #1   Title independent with advanced  postural exercises    Status On-going      PT LONG TERM GOAL #2   Title increased shoulder A/ROM for flexion and abduction >/= 10 degrees due to reduction of thoracic kyphosis    Time 12    Period Weeks    Status On-going      PT LONG TERM GOAL #3   Title thoracic and cervical pain decreased >/= 60% due to improved ROM and postural strength    Time 12    Period Weeks    Status On-going      PT LONG TERM GOAL #4   Title cervical ROM has improved so she is able to look upward with >/= 50% greater ease    Time 12    Period Weeks    Status On-going      PT LONG TERM GOAL #5   Title able to turn her head to see behind herself >/= 60% greater ease    Time 12    Period Weeks    Status On-going                 Plan - 04/20/20 0959    Clinical Impression Statement Much improved shoulder flexion and abduction ROM bilaterally in addition to improved cervical extension.  She responds well to standing postural ex's on the step and against the wall.   Will progress to band resistance against the wall next visit to prepare for a comprehensive home program upon discharge planned in 2 visits.  Therapist monitoring response throughout treatment session.  No complaint of pain but she does require several short rest breaks for shortness of breath which may be the result of wearing double masks.    Personal Factors and Comorbidities Age;Fitness;Comorbidity 1;Time since onset of injury/illness/exacerbation    Comorbidities s/p right breast cancer    Rehab Potential Excellent    PT Frequency 1x / week    PT Duration 12 weeks    PT Treatment/Interventions Electrical Stimulation;Cryotherapy;Moist Heat;Neuromuscular re-education;Therapeutic exercise;Therapeutic activities;Patient/family education;Manual techniques;Energy conservation;Dry needling;Passive range of motion;Taping;Spinal Manipulations    PT Next Visit Plan try band ex's  against the wall and add to HEP;  Nu-step; postural ex's;  plan  for discharge to HEP in 2 visits    PT Home Exercise Plan Access Code: [email or web address]           Patient will benefit from skilled therapeutic intervention in order to improve the following deficits and impairments:  Increased fascial restricitons,Pain,Decreased range of motion,Decreased endurance,Decreased activity tolerance,Increased muscle spasms,Decreased strength,Decreased mobility,Postural dysfunction  Visit Diagnosis: Muscle weakness (generalized)  Cervicalgia  Pain in thoracic spine  Abnormal posture     Problem List Patient Active Problem List   Diagnosis Date Noted  . Chest pain, rule out acute myocardial infarction 02/03/2017  . Palpitations   . Hyponatremia 10/13/2012  . Acute upper respiratory infections of unspecified site 10/13/2012  . Dizziness 03/07/2011  . Shortness of breath 02/19/2011  . Nausea alone 02/19/2011  . Labile hypertension 02/19/2011  . Hypothyroidism 02/19/2011   Ruben Im, PT 04/20/20 5:12 PM Phone: [phone] Fax: [phone] [name] 04/20/2020, 5:12 PM  [facility] [address] [place], [place], [address] Phone: [phone]   Fax:  [phone]  Name: Madison Nunez MRN: [id number] Date of Birth: [birth year]

## 2020-04-21 NOTE — Telephone Encounter (Signed)
CALLED PT SHE STATES THAT SHE IS EATING LESS. PT STATES THAT SHE IS FEELING MUCH BETTER NOW. INFORMED PT THAT HER BP AND HR ARE GOOD. SO WE BELIEVE THAT HER BLOOD SUGAR OR MAYBE HYDRATION IS LOW. INFORMED PT TO MAKE SURE THAT SHE TAKES MEDICATION WITH FOOD AND HYDRATION. VERBALIZES UNDERSTANDING. SHE WILL CALL BACK IF THIS PERSISTS. INFORMED PT THAT SHE CAN CALL 24/7.

## 2020-04-24 ENCOUNTER — Encounter (HOSPITAL_COMMUNITY): Payer: Self-pay | Admitting: Emergency Medicine

## 2020-04-24 ENCOUNTER — Other Ambulatory Visit: Payer: Self-pay

## 2020-04-24 ENCOUNTER — Emergency Department (HOSPITAL_COMMUNITY): Payer: Medicare Other

## 2020-04-24 ENCOUNTER — Encounter (HOSPITAL_COMMUNITY): Payer: Self-pay

## 2020-04-24 ENCOUNTER — Emergency Department (HOSPITAL_COMMUNITY)
Admission: EM | Admit: 2020-04-24 | Discharge: 2020-04-24 | Disposition: A | Discharge status: Home / Self Care | Payer: Medicare Other | Attending: Emergency Medicine | Admitting: Emergency Medicine

## 2020-04-24 ENCOUNTER — Ambulatory Visit (HOSPITAL_COMMUNITY)
Admission: EM | Admit: 2020-04-24 | Discharge: 2020-04-24 | Disposition: A | Discharge status: Home / Self Care | Payer: Medicare Other

## 2020-04-24 DIAGNOSIS — I480 Paroxysmal atrial fibrillation: Secondary | ICD-10-CM

## 2020-04-24 DIAGNOSIS — Z79899 Other long term (current) drug therapy: Secondary | ICD-10-CM | POA: Insufficient documentation

## 2020-04-24 DIAGNOSIS — Z87891 Personal history of nicotine dependence: Secondary | ICD-10-CM | POA: Insufficient documentation

## 2020-04-24 DIAGNOSIS — Z7901 Long term (current) use of anticoagulants: Secondary | ICD-10-CM | POA: Insufficient documentation

## 2020-04-24 DIAGNOSIS — E039 Hypothyroidism, unspecified: Secondary | ICD-10-CM | POA: Insufficient documentation

## 2020-04-24 DIAGNOSIS — I10 Essential (primary) hypertension: Secondary | ICD-10-CM | POA: Insufficient documentation

## 2020-04-24 DIAGNOSIS — Z853 Personal history of malignant neoplasm of breast: Secondary | ICD-10-CM | POA: Insufficient documentation

## 2020-04-24 DIAGNOSIS — R002 Palpitations: Secondary | ICD-10-CM | POA: Diagnosis present

## 2020-04-24 HISTORY — DX: Unspecified atrial fibrillation: I48.91

## 2020-04-24 LAB — CBC WITH DIFFERENTIAL/PLATELET
Abs Immature Granulocytes: 0.01 10*3/uL (ref 0.00–0.07)
Basophils Absolute: 0 10*3/uL (ref 0.0–0.1)
Basophils Relative: 0 %
Eosinophils Absolute: 0 10*3/uL (ref 0.0–0.5)
Eosinophils Relative: 1 %
HCT: 42.7 % (ref 36.0–46.0)
Hemoglobin: 14.4 g/dL (ref 12.0–15.0)
Immature Granulocytes: 0 %
Lymphocytes Relative: 30 %
Lymphs Abs: 1.4 10*3/uL (ref 0.7–4.0)
MCH: 32 pg (ref 26.0–34.0)
MCHC: 33.7 g/dL (ref 30.0–36.0)
MCV: 94.9 fL (ref 80.0–100.0)
Monocytes Absolute: 0.3 10*3/uL (ref 0.1–1.0)
Monocytes Relative: 6 %
Neutro Abs: 2.9 10*3/uL (ref 1.7–7.7)
Neutrophils Relative %: 63 %
Platelets: 223 10*3/uL (ref 150–400)
RBC: 4.5 MIL/uL (ref 3.87–5.11)
RDW: 11.5 % (ref 11.5–15.5)
WBC: 4.6 10*3/uL (ref 4.0–10.5)
nRBC: 0 % (ref 0.0–0.2)

## 2020-04-24 LAB — COMPREHENSIVE METABOLIC PANEL
ALT: 23 U/L (ref 0–44)
AST: 23 U/L (ref 15–41)
Albumin: 3.7 g/dL (ref 3.5–5.0)
Alkaline Phosphatase: 62 U/L (ref 38–126)
Anion gap: 10 (ref 5–15)
BUN: 13 mg/dL (ref 8–23)
CO2: 24 mmol/L (ref 22–32)
Calcium: 9.1 mg/dL (ref 8.9–10.3)
Chloride: 100 mmol/L (ref 98–111)
Creatinine, Ser: 0.65 mg/dL (ref 0.44–1.00)
GFR, Estimated: >60 mL/min (ref >60)
Glucose, Bld: 97 mg/dL (ref 70–99)
Potassium: 4 mmol/L (ref 3.5–5.1)
Sodium: 134 mmol/L — ABNORMAL LOW (ref 135–145)
Total Bilirubin: 0.7 mg/dL (ref 0.3–1.2)
Total Protein: 6.8 g/dL (ref 6.5–8.1)

## 2020-04-24 LAB — TROPONIN I (HIGH SENSITIVITY)
Troponin I (High Sensitivity): 13 ng/L (ref <18)
Troponin I (High Sensitivity): 19 ng/L — ABNORMAL HIGH (ref <18)

## 2020-04-24 LAB — PROTIME-INR
INR: 1.2 (ref 0.8–1.2)
Prothrombin Time: 14.7 seconds (ref 11.4–15.2)

## 2020-04-24 LAB — MAGNESIUM: Magnesium: 1.9 mg/dL (ref 1.7–2.4)

## 2020-04-24 LAB — TSH: TSH: 0.904 u[IU]/mL (ref 0.350–4.500)

## 2020-04-24 MED ORDER — DILTIAZEM HCL 25 MG/5ML IV SOLN
20.0000 mg | Freq: Once | INTRAVENOUS | Status: DC
  Filled 2020-04-24: qty 5

## 2020-04-24 MED ORDER — SODIUM CHLORIDE 0.9 % IV BOLUS
500.0000 mL | Freq: Once | INTRAVENOUS | Status: AC
  Administered 2020-04-24: 500 mL via INTRAVENOUS

## 2020-04-24 NOTE — ED Provider Notes (Signed)
[facility] EMERGENCY DEPARTMENT Provider Note   CSN: [id number] Arrival date & time: 04/24/20  1340     History Chief Complaint  Patient presents with  . atrial fib RVR    Madison Nunez is a 75 y.o. female.  HPI   75 year old female past medical history of paroxysmal atrial fibrillation anticoagulant Eliquis, HTN, hypothyroidism presents to the emergency department with palpitations.  Patient's been evaluated by cardiology (Dr. Debara Pickett) as an outpatient for paroxysmal atrial fibrillation.  She is currently not on any medication for rate control.  She was seen in this emergency department about a week ago with symptoms of palpitations.  She was A. fib with RVR on arrival, spontaneously converted to sinus.  Was seen in the cardiology clinic as an outpatient and was placed on Eliquis.  She started this dose last Wednesday, she is on day 5 of anticoagulation.  States a couple hours prior to arrival she started experiencing fluttering in the middle of her chest.  Denies any chest pain, shortness of breath or other acute infectious symptoms.  Past Medical History:  Diagnosis Date  . Arthritis    hands and lower back  . Atrial fibrillation ([place])    Per patient  . Breast cancer [facility]) 2005   right lumpectomy, chemotherapy with A/C combo  & Taxol, mammogram 02/06/11 without recurrence  . Dyspnea    Echocardiogram 02/20/11: EF 60-65%.;  ETT-myoveiw 12/12 normal with EF 80%  . Hypertension   . Hypothyroidism   . Personal history of chemotherapy 10/2003  . Personal history of radiation therapy 03/2004    Patient Active Problem List   Diagnosis Date Noted  . Chest pain, rule out acute myocardial infarction 02/03/2017  . Palpitations   . Hyponatremia 10/13/2012  . Acute upper respiratory infections of unspecified site 10/13/2012  . Dizziness 03/07/2011  . Shortness of breath 02/19/2011  . Nausea alone 02/19/2011  . Labile hypertension 02/19/2011  . Hypothyroidism  02/19/2011    Past Surgical History:  Procedure Laterality Date  . APPENDECTOMY  1955  . BREAST BIOPSY  08/29/2003   malignant  . BREAST LUMPECTOMY  2005   right  . TONSILLECTOMY     "as a child"     OB History   No obstetric history on file.     Family History  Problem Relation Age of Onset  . Coronary artery disease Brother 87       died of first MI at 81  . Coronary artery disease Father        had juvenile DM most of his life, had 3 MI's the first in his early 75's, died at 82  . Diabetes Father   . Breast cancer Maternal Grandmother     Social History   Tobacco Use  . Smoking status: Former Smoker    Packs/day: 1.00    Years: 5.00    Pack years: 5.00    Types: Cigarettes  . Smokeless tobacco: Never Used  Substance Use Topics  . Alcohol use: Yes    Alcohol/week: 7.0 standard drinks    Types: 7 Glasses of wine per week  . Drug use: No    Home Medications Prior to Admission medications   Medication Sig Start Date End Date Taking? Authorizing Provider  apixaban (ELIQUIS) 5 MG TABS tablet Take 1 tablet (5 mg total) by mouth 2 (two) times daily. 04/17/20  Yes Cleaver, Jossie Ng, NP  ascorbic Acid (VITAMIN C) 500 MG CPCR Take 500 mg by  mouth daily.   Yes [provider]  B Complex Vitamins (VITAMIN B COMPLEX) TABS Take 0.5-1 tablets by mouth in the morning.   Yes [provider]  Calcium Carbonate-Vitamin D (CALTRATE 600+D PO) Take 1 tablet by mouth daily with lunch.   Yes [provider]  Cholecalciferol (VITAMIN D3) 50 MCG (2000 UT) TABS Take 2,000 microcuries/1.34m2 by mouth daily.   Yes [provider]  Flaxseed Oil OIL Take 1,300 mg by mouth in the morning and at bedtime.   Yes [provider]  latanoprost (XALATAN) 0.005 % ophthalmic solution Place 1 drop into both eyes at bedtime. 06/25/19  Yes [provider]  levothyroxine (SYNTHROID, LEVOTHROID) 50 MCG tablet Take 50 mcg by mouth daily.   Yes [provider]  lisinopril (ZESTRIL) 30 MG tablet Take 30 mg by mouth daily after supper. 03/16/20  Yes [provider]  Multiple Vitamins-Minerals (CENTRUM SILVER 50+WOMEN) TABS Take 1 tablet by mouth daily with breakfast.   Yes [provider]  TURMERIC PO Take 500 mg by mouth daily.   Yes [provider]  aspirin EC 81 MG tablet Take 81 mg by mouth daily.    [provider]  lisinopril (PRINIVIL,ZESTRIL) 40 MG tablet Take 1 tablet (40 mg total) by mouth daily. Patient taking differently: Take 40 mg by mouth every evening.  10/14/12   Janece Canterbury, MD    Allergies    Hctz [hydrochlorothiazide], Beta adrenergic blockers, Prednisone, and Tape  Review of Systems   Review of Systems  Constitutional: Positive for fatigue. Negative for chills and fever.  HENT: Negative for congestion.   Eyes: Negative for visual disturbance.  Respiratory: Negative for shortness of breath.   Cardiovascular: Positive for palpitations. Negative for chest pain and leg swelling.  Gastrointestinal: Negative for abdominal pain, diarrhea and vomiting.  Genitourinary: Negative for dysuria.  Skin: Negative for rash.  Neurological: Negative for headaches.    Physical Exam Updated Vital Signs BP 131/84   Pulse 83   Temp 98 F (36.7 C) (Oral)   Resp 15   LMP 02/19/2011   SpO2 97%   Physical Exam Vitals and nursing note reviewed.  Constitutional:      Appearance: Normal appearance.  HENT:     Head: Normocephalic.     Mouth/Throat:     Mouth: Mucous membranes are moist.  Cardiovascular:     Rate and Rhythm: Tachycardia present. Rhythm irregular.  Pulmonary:     Effort: Pulmonary effort is normal. No respiratory distress.  Abdominal:     Palpations: Abdomen is soft.     Tenderness: There is no abdominal tenderness.  Musculoskeletal:        General: Swelling present.  Skin:    General: Skin is warm.  Neurological:     Mental Status: She is alert and oriented to  person, place, and time. Mental status is at baseline.  Psychiatric:        Mood and Affect: Mood normal.     ED Results / Procedures / Treatments   Labs (all labs ordered are listed, but only abnormal results are displayed) Labs Reviewed  CBC WITH DIFFERENTIAL/PLATELET  PROTIME-INR  COMPREHENSIVE METABOLIC PANEL  MAGNESIUM  TROPONIN I (HIGH SENSITIVITY)    EKG EKG Interpretation  Date/Time:  Monday April 24 2020 14:33:24 EST Ventricular Rate:  99 PR Interval:    QRS Duration: 112 QT Interval:  348 QTC Calculation: 447 R Axis:   -42 Text Interpretation: Sinus rhythm Probable left atrial  enlargement Incomplete RBBB and LAFB Low voltage, precordial leads Left ventricular hypertrophy Anterior Q waves, possibly due to LVH ST elevation, consider inferior injury NSR, no STEMI Confirmed by Lavenia Atlas [phone]) on 04/24/2020 3:16:03 PM   Radiology DG Chest Port 1 View  Result Date: 04/24/2020 CLINICAL DATA:  Shortness of breath. EXAM: PORTABLE CHEST 1 VIEW COMPARISON:  April 15, 2020. FINDINGS: The heart size and mediastinal contours are within normal limits. Both lungs are clear. No pneumothorax or pleural effusion is noted. Left subclavian Port-A-Cath is unchanged in position. The visualized skeletal structures are unremarkable. IMPRESSION: No active disease. Electronically Signed   By: Marijo Conception M.D.   On: 04/24/2020 14:35    Procedures .Critical Care Performed by: Lorelle Gibbs, DO Authorized by: Lorelle Gibbs, DO   Critical care provider statement:    Critical care time (minutes):  35   Critical care was time spent personally by me on the following activities:  Discussions with consultants, evaluation of patient's response to treatment, examination of patient, ordering and performing treatments and interventions, ordering and review of laboratory studies, ordering and review of radiographic studies, pulse oximetry, re-evaluation of patient's condition,  obtaining history from patient or surrogate and review of old charts   I assumed direction of critical care for this patient from another provider in my specialty: no       Medications Ordered in ED Medications  diltiazem (CARDIZEM) injection 20 mg (0 mg Intravenous Hold 04/24/20 1457)  sodium chloride 0.9 % bolus 500 mL (500 mLs Intravenous New Bag/Given 04/24/20 1438)    ED Course  I have reviewed the triage vital signs and the nursing notes.  Pertinent labs & imaging results that were available during my care of the patient were reviewed by me and considered in my medical decision making (see chart for details).    MDM Rules/Calculators/A&P                          75 year old female presents the emergency department palpitations.  Arrival EKG shows atrial fibrillation with RVR, blood pressure is stable.  She is asymptomatic.  CBC is unremarkable, we are pending electrolytes, TSH was ordered.  Chest x-ray shows no acute finding.  Patient again spontaneously converted to sinus rhythm prior to dose of Cardizem.  Cardiology consult has been placed, they will evaluate the patient. Patient signed out to Dr. Jeanell Sparrow.  Final Clinical Impression(s) / ED Diagnoses Final diagnoses:  None    Rx / DC Orders ED Discharge Orders    None       Lorelle Gibbs, DO 04/24/20 1519

## 2020-04-24 NOTE — ED Provider Notes (Signed)
75 yo female with ho paroxysmal a fib recently seen here in afib, then spontaneously.  Seen in a fib clinic and started on eliquis.  Seen here today a fib rvr today, then converted on cardizem. Physical Exam  BP 134/87   Pulse 82   Temp 98 F (36.7 C) (Oral)   Resp 17   LMP 02/19/2011   SpO2 98%   Physical Exam  ED Course/Procedures     Procedures  MDM  Awaiting cardiology consult Not currently on any afib rate control meds Discussed with Dr. Acie Fredrickson who saw patient and advised her regarding outpatient f/u He feels patient is stable for d/c and does not think any rate control medications are indicated at this time       Pattricia Boss, MD 04/24/20 1754

## 2020-04-24 NOTE — ED Notes (Signed)
Pt in SR. Obtained EKG and will notify MD

## 2020-04-24 NOTE — ED Notes (Signed)
EMS called, cardiac monitor and emergency transportation requested.   ED charge nurse called, report was given.

## 2020-04-24 NOTE — Discharge Instructions (Addendum)
Follow up with afib clinic as per Dr. Elmarie Shiley discussion. Return if your are having chest pain, shortness of breath, or worse in any way.

## 2020-04-24 NOTE — Consult Note (Signed)
Cardiology Consultation:   Patient ID: Madison Nunez MRN: [id number]; DOB: May 12, 1945  Admit date: 04/24/2020 Date of Consult: 04/24/2020  PCP:  Bernerd Limbo, MD   [place]  Cardiologist:  Pixie Casino, MD  Advanced Practice Provider:  No care team member to display Electrophysiologist:  None   Click here to update Patient Care Team and Refresh Note - MD (PCP) or APP (Team Member)  Change PCP Type for MD, Specialty for APP is either Cardiology or Clinical Cardiac Electrophysiology  :[id number]}    Patient Profile:   Madison Nunez is a 75 y.o. female with a hx of  PAF,  who is being seen today for the evaluation of recurrent atrial fib  at the request of  Dr. Jeanell Sparrow.  History of Present Illness:   Madison Nunez is a 75 year old female who was recently found to have paroxysmal atrial fibrillation.  She was previously seen by Dr. Debara Pickett last week and was started on Eliquis.  She was discharged from the ER at that time.  She now presents with recurrent palpitations.  She woke up this am Ate, did some exercises. Palpitations started again . She measured a slow HR ( 45 according to her BP cuff ) she had presyncope or feeling of uneasiness and came back to the er .  Episode of PAF lasted about 1.5 hours  She stated that she really never felt very bad but just that the palpitations were making her anxious. Thought she may be dehydrated so she took some kosher salt in water  Is feeling better. Has converted back to NSR  Exercises ( light exercise ) stretches,  Walks regulalry   Past Medical History:  Diagnosis Date  . Arthritis    hands and lower back  . Atrial fibrillation ([place])    Per patient  . Breast cancer [facility]) 2005   right lumpectomy, chemotherapy with A/C combo  & Taxol, mammogram 02/06/11 without recurrence  . Dyspnea    Echocardiogram 02/20/11: EF 60-65%.;  ETT-myoveiw 12/12 normal with EF 80%  . Hypertension   . Hypothyroidism   . Personal  history of chemotherapy 10/2003  . Personal history of radiation therapy 03/2004    Past Surgical History:  Procedure Laterality Date  . APPENDECTOMY  1955  . BREAST BIOPSY  08/29/2003   malignant  . BREAST LUMPECTOMY  2005   right  . TONSILLECTOMY     "as a child"     Home Medications:  Prior to Admission medications   Medication Sig Start Date End Date Taking? Authorizing Provider  apixaban (ELIQUIS) 5 MG TABS tablet Take 1 tablet (5 mg total) by mouth 2 (two) times daily. 04/17/20  Yes Cleaver, Jossie Ng, NP  ascorbic Acid (VITAMIN C) 500 MG CPCR Take 500 mg by mouth daily.   Yes [provider]  B Complex Vitamins (VITAMIN B COMPLEX) TABS Take 0.5-1 tablets by mouth in the morning.   Yes [provider]  Calcium Carbonate-Vitamin D (CALTRATE 600+D PO) Take 1 tablet by mouth daily with lunch.   Yes [provider]  Cholecalciferol (VITAMIN D3) 50 MCG (2000 UT) TABS Take 2,000 Units by mouth daily.   Yes [provider]  Flaxseed Oil OIL Take 1,300 mg by mouth in the morning and at bedtime.   Yes [provider]  latanoprost (XALATAN) 0.005 % ophthalmic solution Place 1 drop into both eyes at bedtime. 06/25/19  Yes [provider]  levothyroxine (SYNTHROID, [place]) 50  MCG tablet Take 50 mcg by mouth daily.   Yes [provider]  lisinopril (ZESTRIL) 30 MG tablet Take 30 mg by mouth daily after supper. 03/16/20  Yes [provider]  Multiple Vitamins-Minerals (CENTRUM SILVER 50+WOMEN) TABS Take 1 tablet by mouth daily with breakfast.   Yes [provider]  TURMERIC PO Take 500 mg by mouth daily.   Yes [provider]  aspirin EC 81 MG tablet Take 81 mg by mouth daily.    [provider]  lisinopril (PRINIVIL,ZESTRIL) 40 MG tablet Take 1 tablet (40 mg total) by mouth daily. Patient not taking: Reported on 04/24/2020 10/14/12   Janece Canterbury, MD    Inpatient Medications: Scheduled  Meds: . diltiazem  20 mg Intravenous Once   Continuous Infusions:  PRN Meds:   Allergies:    Allergies  Allergen Reactions  . Hctz [Hydrochlorothiazide] Other (See Comments)    Hyponatremia (leading to hospitalization)  . Beta Adrenergic Blockers Nausea Only and Other (See Comments)    Metoprolol and Carvedilol have stopped working for patient over time, causing nausea also  . Prednisone Other (See Comments)    Had reaction with use prior to chemo- ONLY IN HIGHER DOSES  . Tape Other (See Comments)    If left in place for a day or two, this causes irritation    Social History:   Social History   Socioeconomic History  . Marital status: Divorced    Spouse name: Not on file  . Number of children: Not on file  . Years of education: Not on file  . Highest education level: Not on file  Occupational History  . Not on file  Tobacco Use  . Smoking status: Former Smoker    Packs/day: 1.00    Years: 5.00    Pack years: 5.00    Types: Cigarettes  . Smokeless tobacco: Never Used  Substance and Sexual Activity  . Alcohol use: Yes    Alcohol/week: 7.0 standard drinks    Types: 7 Glasses of wine per week  . Drug use: No  . Sexual activity: Never  Other Topics Concern  . Not on file  Social History Narrative   Lives alone.  Drives.     Social Determinants of Health   Financial Resource Strain: Not on file  Food Insecurity: Not on file  Transportation Needs: Not on file  Physical Activity: Not on file  Stress: Not on file  Social Connections: Not on file  Intimate Partner Violence: Not on file    Family History:    Family History  Problem Relation Age of Onset  . Coronary artery disease Brother 61       died of first MI at 9  . Coronary artery disease Father        had juvenile DM most of his life, had 3 MI's the first in his early 62's, died at 69  . Diabetes Father   . Breast cancer Maternal Grandmother      ROS:  Please see the history of present illness.    All other ROS reviewed and negative.     Physical Exam/Data:   Vitals:   04/24/20 1348 04/24/20 1500 04/24/20 1515 04/24/20 1630  BP: (!) 150/105 131/84 134/87 132/70  Pulse: (!) 137 83 82 63  Resp: 20 15 17 16   Temp: 98 F (36.7 C)     TempSrc: Oral     SpO2: 99% 97% 98% 99%    Intake/Output Summary (Last 24 hours)  at 04/24/2020 1740 Last data filed at 04/24/2020 1625 Gross per 24 hour  Intake 500 ml  Output -  Net 500 ml   Last 3 Weights 04/17/2020 03/14/2017 04/23/2015  Weight (lbs) 158 lb 9.6 oz 153 lb 160 lb 4.8 oz  Weight (kg) 71.94 kg 69.4 kg 72.712 kg     There is no height or weight on file to calculate BMI.  General:  Well nourished, well developed, in no acute distress  HEENT: normal Lymph: no adenopathy Neck: no JVD Endocrine:  No thryomegaly Vascular: No carotid bruits; FA pulses 2+ bilaterally without bruits  Cardiac: Regular rate S1-S2.  She has a soft systolic murmur at the left sternal border.  There is no radiation to the axilla. Lungs:  clear to auscultation bilaterally, no wheezing, rhonchi or rales  Abd: soft, nontender, no hepatomegaly  Ext: no edema Musculoskeletal:  No deformities, BUE and BLE strength normal and equal Skin: warm and dry  Neuro:  CNs 2-12 intact, no focal abnormalities noted Psych:  Normal affect   EKG:  The EKG was personally reviewed and demonstrates:   NSR  Telemetry:  Telemetry was personally reviewed and demonstrates:   NSR   Relevant CV Studies:   Laboratory Data:  High Sensitivity Troponin:   Recent Labs  Lab 04/24/20 1437 04/24/20 1630  TROPONINIHS 13 19*     Chemistry Recent Labs  Lab 04/24/20 1437  NA 134*  K 4.0  CL 100  CO2 24  GLUCOSE 97  BUN 13  CREATININE 0.65  CALCIUM 9.1  GFRNONAA >60  ANIONGAP 10    Recent Labs  Lab 04/24/20 1437  PROT 6.8  ALBUMIN 3.7  AST 23  ALT 23  ALKPHOS 62  BILITOT 0.7   Hematology Recent Labs  Lab 04/24/20 1437  WBC 4.6  RBC 4.50  HGB 14.4  HCT  42.7  MCV 94.9  MCH 32.0  MCHC 33.7  RDW 11.5  PLT 223   BNPNo results for input(s): BNP, PROBNP in the last 168 hours.  DDimer No results for input(s): DDIMER in the last 168 hours.   Radiology/Studies:  [facility] Chest Port 1 View  Result Date: 04/24/2020 CLINICAL DATA:  Shortness of breath. EXAM: PORTABLE CHEST 1 VIEW COMPARISON:  April 15, 2020. FINDINGS: The heart size and mediastinal contours are within normal limits. Both lungs are clear. No pneumothorax or pleural effusion is noted. Left subclavian Port-A-Cath is unchanged in position. The visualized skeletal structures are unremarkable. IMPRESSION: No active disease. Electronically Signed   By: Marijo Conception M.D.   On: 04/24/2020 14:35     Assessment and Plan:   1. Paroxysmal atrial fibrillation: And presents with episodes of paroxysmal atrial fibrillation.  These episodes typically last for about an hour and a half.  She did not feel that badly with this last episode but she got scared.  She measured her heart rate and blood pressure was found to be 46.  I suspect that there is a good chance that this was not measured correctly by her blood pressure cuff.  She said she felt uneasy but never had any passing out episodes.  At this point I don't think she needs any additional medications.  She is already on Eliquis.  She has converted to sinus rhythm already.  I have reassured her that atrial fibrillation is more of a nuisance issue for her.  She is protected against having a stroke.  We'll set her up to see the atrial fibrillation clinic.  I have encouraged her to get a Kardia mobile monitor for further evaluation of her heart rate.  This will help [place] know what her HR is during her palpitations  She is stable to be discharged from the ER    Risk Assessment/Risk Scores:          CHA2DS2-VASc Score = 2  This indicates a 2.2% annual risk of stroke. The patient's score is based upon: CHF History: No HTN History:  No Diabetes History: No Stroke History: No Vascular Disease History: No Age Score: 1 Gender Score: 1          For questions or updates, please contact [place] Please consult [email or web address] for contact info under    Signed, Mertie Moores, MD  04/24/2020 5:40 PM

## 2020-04-24 NOTE — ED Triage Notes (Signed)
Pt dx w/ A-fib last week and started eliquis. Pt c/o palpitations today and went to UC. UC called EMS to transport pt here.

## 2020-04-24 NOTE — ED Triage Notes (Signed)
Pt reports she started feeling dizzy and weak 4 hrs ago. States she was diagnosed with afib 1 week ago, and started Eliquis.  Denies chest pain, headache, nausea, vision changes,

## 2020-04-27 ENCOUNTER — Other Ambulatory Visit: Payer: Self-pay

## 2020-04-27 ENCOUNTER — Ambulatory Visit: Payer: Medicare Other | Admitting: Physical Therapy

## 2020-04-27 DIAGNOSIS — M6281 Muscle weakness (generalized): Secondary | ICD-10-CM | POA: Diagnosis not present

## 2020-04-27 DIAGNOSIS — M546 Pain in thoracic spine: Secondary | ICD-10-CM

## 2020-04-27 DIAGNOSIS — R293 Abnormal posture: Secondary | ICD-10-CM

## 2020-04-27 DIAGNOSIS — M542 Cervicalgia: Secondary | ICD-10-CM

## 2020-04-27 NOTE — Therapy (Signed)
[facility] [address], [place], [place], [address] Phone: [phone]   Fax:  [phone]  Physical Therapy Treatment  Patient Details  Name: Madison Nunez MRN: [id number] Date of Birth: 05/20/1945 Referring Provider (PT): Dr. Bernerd Limbo   Encounter Date: 04/27/2020   PT End of Session - 04/27/20 1047    Visit Number 6    Date for PT Re-Evaluation 06/05/20    Authorization Type [employer] medicare    PT Start Time 7517    PT Stop Time 0017   shortened session pt not feeling well   PT Time Calculation (min) 32 min           Past Medical History:  Diagnosis Date  . Arthritis    hands and lower back  . Atrial fibrillation ([place])    Per patient  . Breast cancer [facility]) 2005   right lumpectomy, chemotherapy with A/C combo  & Taxol, mammogram 02/06/11 without recurrence  . Dyspnea    Echocardiogram 02/20/11: EF 60-65%.;  ETT-myoveiw 12/12 normal with EF 80%  . Hypertension   . Hypothyroidism   . Personal history of chemotherapy 10/2003  . Personal history of radiation therapy 03/2004    Past Surgical History:  Procedure Laterality Date  . APPENDECTOMY  1955  . BREAST BIOPSY  08/29/2003   malignant  . BREAST LUMPECTOMY  2005   right  . TONSILLECTOMY     "as a child"    There were no vitals filed for this visit.   Subjective Assessment - 04/27/20 1018    Subjective I did OK last time.  I feel fair this morning.  No back pain.    Pertinent History I had some vein work recently in my legs    Patient Stated Goals learn how to make back feel better.    Currently in Pain? No/denies    Pain Score 0-No pain    Pain Location Thoracic                             [facility] Adult PT Treatment/Exercise - 04/27/20 0001      Lumbar Exercises: Stretches   Other Lumbar Stretch Exercise 1st step psoas with UE reaches 2x5 right/left      Lumbar Exercises: Aerobic   Nustep L1 seat 8 6 min while  discussing progress      Shoulder Exercises: Standing   Other Standing Exercises wall with towel roll at thoracic region red band: overhead, horizontal abduction double and singles; diagonals; Ws 5 reps    Other Standing Exercises discussion of adding 2# weight slides on wall                    PT Short Term Goals - 04/13/20 1310      PT SHORT TERM GOAL #1   Title independent with initial HEP    Status Achieved      PT SHORT TERM GOAL #2   Title back pain decreased >/= 25% due to understand how to correct her posture    Status Achieved      PT SHORT TERM GOAL #3   Title measuring from C7-T12 decreased from 37 cm to 36 cm due to increased in thoracic extension    Status Achieved             PT Long Term Goals - 04/20/20 0959      PT LONG TERM GOAL #1  Title independent with advanced postural exercises    Status On-going      PT LONG TERM GOAL #2   Title increased shoulder A/ROM for flexion and abduction >/= 10 degrees due to reduction of thoracic kyphosis    Time 12    Period Weeks    Status On-going      PT LONG TERM GOAL #3   Title thoracic and cervical pain decreased >/= 60% due to improved ROM and postural strength    Time 12    Period Weeks    Status On-going      PT LONG TERM GOAL #4   Title cervical ROM has improved so she is able to look upward with >/= 50% greater ease    Time 12    Period Weeks    Status On-going      PT LONG TERM GOAL #5   Title able to turn her head to see behind herself >/= 60% greater ease    Time 12    Period Weeks    Status On-going                 Plan - 04/27/20 1053    Clinical Impression Statement The patient is able to participate in aerobic warm up, dynamic stretching and  standing postural exercises at the wall.  She requests limited exercise today citing non-specific issues the last 2 weeks.   Noted ED visits in chart for A-fib.   Therapist monitoring response and recommending extra rest breaks today  between each exercise.  Shortened treatment session based on response.    Personal Factors and Comorbidities Age;Fitness;Comorbidity 1;Time since onset of injury/illness/exacerbation    Examination-Activity Limitations Carry;Lift;Stand;Bend;Reach Overhead    Rehab Potential Excellent    PT Frequency 1x / week    PT Duration 12 weeks    PT Treatment/Interventions Electrical Stimulation;Cryotherapy;Moist Heat;Neuromuscular re-education;Therapeutic exercise;Therapeutic activities;Patient/family education;Manual techniques;Energy conservation;Dry needling;Passive range of motion;Taping;Spinal Manipulations    PT Next Visit Plan review band ex's on wall and HEP review;  check progress toward goals, patient expresses readiness for possible discharge next visit;  cervical ROM; posture measurement    PT Home Exercise Plan Access Code: [email or web address]           Patient will benefit from skilled therapeutic intervention in order to improve the following deficits and impairments:  Increased fascial restricitons,Pain,Decreased range of motion,Decreased endurance,Decreased activity tolerance,Increased muscle spasms,Decreased strength,Decreased mobility,Postural dysfunction  Visit Diagnosis: Muscle weakness (generalized)  Cervicalgia  Pain in thoracic spine  Abnormal posture     Problem List Patient Active Problem List   Diagnosis Date Noted  . Chest pain, rule out acute myocardial infarction 02/03/2017  . Palpitations   . Hyponatremia 10/13/2012  . Acute upper respiratory infections of unspecified site 10/13/2012  . Dizziness 03/07/2011  . Shortness of breath 02/19/2011  . Nausea alone 02/19/2011  . Labile hypertension 02/19/2011  . Hypothyroidism 02/19/2011   Madison Nunez, PT 04/27/20 7:12 PM Phone: [phone] Fax: [phone] Madison Nunez 04/27/2020, 7:12 PM  [facility] [address] [place], [place],  [address] Phone: [phone]   Fax:  [phone]  Name: Madison Nunez MRN: [id number] Date of Birth: [birth year]

## 2020-05-01 ENCOUNTER — Ambulatory Visit (HOSPITAL_COMMUNITY)
Admission: RE | Admit: 2020-05-01 | Discharge: 2020-05-01 | Disposition: A | Discharge status: Home / Self Care | Payer: Medicare Other | Source: Ambulatory Visit | Attending: Physician Assistant | Admitting: Physician Assistant

## 2020-05-01 ENCOUNTER — Encounter (HOSPITAL_COMMUNITY): Payer: Self-pay | Admitting: Physician Assistant

## 2020-05-01 ENCOUNTER — Other Ambulatory Visit: Payer: Self-pay

## 2020-05-01 VITALS — BP 140/72 | HR 65 | Ht 64.0 in | Wt 157.0 lb

## 2020-05-01 DIAGNOSIS — I10 Essential (primary) hypertension: Secondary | ICD-10-CM | POA: Diagnosis not present

## 2020-05-01 DIAGNOSIS — Z7901 Long term (current) use of anticoagulants: Secondary | ICD-10-CM | POA: Diagnosis not present

## 2020-05-01 DIAGNOSIS — D6869 Other thrombophilia: Secondary | ICD-10-CM | POA: Diagnosis not present

## 2020-05-01 DIAGNOSIS — I480 Paroxysmal atrial fibrillation: Secondary | ICD-10-CM | POA: Insufficient documentation

## 2020-05-01 DIAGNOSIS — I4510 Unspecified right bundle-branch block: Secondary | ICD-10-CM | POA: Diagnosis not present

## 2020-05-01 DIAGNOSIS — I444 Left anterior fascicular block: Secondary | ICD-10-CM | POA: Diagnosis not present

## 2020-05-01 MED ORDER — DILTIAZEM HCL ER COATED BEADS 120 MG PO CP24: 120.0000 mg | ORAL_CAPSULE | Freq: Every day | ORAL | 3 refills | Status: DC

## 2020-05-01 NOTE — [place] Notes (Signed)
Primary Care Physician: Bernerd Limbo, MD Primary Cardiologist: Dr Debara Pickett Primary Electrophysiologist: none Referring Physician: Dr Delton Coombes is a 75 y.o. female with a history of HTN, hypothyroidism, breast cancer, and atrial fibrillation who presents for consultation in the [facility]. The patient was initially diagnosed with atrial fibrillation 04/15/20 after presenting to the ED with symptoms of palpitations. ECG showed afib with RVR. She converted back to SR in the ED. Patient is on Eliquis for a CHADS2VASC score of 3. She does report that she has had palpitations for years but never diagnosed with afib. Over the last 2 weeks these palpitations have become more frequent. There does not appear to be any specific triggers.   Today, she denies symptoms of chest pain, shortness of breath, orthopnea, PND, lower extremity edema, dizziness, presyncope, syncope, snoring, daytime somnolence, bleeding, or neurologic sequela. The patient is tolerating medications without difficulties and is otherwise without complaint today.    Atrial Fibrillation Risk Factors:  she does not have symptoms or diagnosis of sleep apnea. she does not have a history of rheumatic fever. she does have a history of alcohol use. The patient does not have a history of early familial atrial fibrillation or other arrhythmias.  she has a BMI of Body mass index is 26.95 kg/m.Marland Kitchen Filed Weights   05/01/20 1033  Weight: 71.2 kg    Family History  Problem Relation Age of Onset  . Coronary artery disease Brother 22       died of first MI at 48  . Coronary artery disease Father        had juvenile DM most of his life, had 3 MI's the first in his early 27's, died at 67  . Diabetes Father   . Breast cancer Maternal Grandmother      Atrial Fibrillation Management history:  Previous antiarrhythmic drugs: none Previous cardioversions: none Previous ablations: none CHADS2VASC  score: 3 Anticoagulation history: Eliquis   Past Medical History:  Diagnosis Date  . Arthritis    hands and lower back  . Atrial fibrillation ([place])    Per patient  . Breast cancer [facility]) 2005   right lumpectomy, chemotherapy with A/C combo  & Taxol, mammogram 02/06/11 without recurrence  . Dyspnea    Echocardiogram 02/20/11: EF 60-65%.;  ETT-myoveiw 12/12 normal with EF 80%  . Hypertension   . Hypothyroidism   . Personal history of chemotherapy 10/2003  . Personal history of radiation therapy 03/2004   Past Surgical History:  Procedure Laterality Date  . APPENDECTOMY  1955  . BREAST BIOPSY  08/29/2003   malignant  . BREAST LUMPECTOMY  2005   right  . TONSILLECTOMY     "as a child"    Current Outpatient Medications  Medication Sig Dispense Refill  . apixaban (ELIQUIS) 5 MG TABS tablet Take 1 tablet (5 mg total) by mouth 2 (two) times daily. 60 tablet 6  . ascorbic Acid (VITAMIN C) 500 MG CPCR Take 500 mg by mouth daily.    . B Complex Vitamins (VITAMIN B COMPLEX) TABS Take 0.5-1 tablets by mouth in the morning.    . Calcium Carbonate-Vitamin D (CALTRATE 600+D PO) Take 1 tablet by mouth daily with lunch.    . Cholecalciferol (VITAMIN D3) 50 MCG (2000 UT) TABS Take 2,000 Units by mouth daily.    . Flaxseed Oil OIL Take 1,300 mg by mouth in the morning and at bedtime.    Marland Kitchen latanoprost (XALATAN) 0.005 %  ophthalmic solution Place 1 drop into both eyes at bedtime.    Marland Kitchen levothyroxine (SYNTHROID, LEVOTHROID) 50 MCG tablet Take 50 mcg by mouth daily.    Marland Kitchen lisinopril (PRINIVIL,ZESTRIL) 40 MG tablet Take 1 tablet (40 mg total) by mouth daily. 30 tablet 0  . lisinopril (ZESTRIL) 30 MG tablet Take 30 mg by mouth daily after supper.    . Multiple Vitamins-Minerals (CENTRUM SILVER 50+WOMEN) TABS Take 1 tablet by mouth daily with breakfast.    . TURMERIC PO Take 500 mg by mouth daily.     No current facility-administered medications for this encounter.    Allergies  Allergen Reactions   . Hctz [Hydrochlorothiazide] Other (See Comments)    Hyponatremia (leading to hospitalization)  . Beta Adrenergic Blockers Nausea Only and Other (See Comments)    Metoprolol and Carvedilol have stopped working for patient over time, causing nausea also  . Prednisone Other (See Comments)    Had reaction with use prior to chemo- ONLY IN HIGHER DOSES    Social History   Socioeconomic History  . Marital status: Divorced    Spouse name: Not on file  . Number of children: Not on file  . Years of education: Not on file  . Highest education level: Not on file  Occupational History  . Not on file  Tobacco Use  . Smoking status: Former Smoker    Packs/day: 1.00    Years: 5.00    Pack years: 5.00    Types: Cigarettes  . Smokeless tobacco: Never Used  Substance and Sexual Activity  . Alcohol use: Yes    Alcohol/week: 7.0 standard drinks    Types: 7 Glasses of wine per week  . Drug use: No  . Sexual activity: Never  Other Topics Concern  . Not on file  Social History Narrative   Lives alone.  Drives.     Social Determinants of Health   Financial Resource Strain: Not on file  Food Insecurity: Not on file  Transportation Needs: Not on file  Physical Activity: Not on file  Stress: Not on file  Social Connections: Not on file  Intimate Partner Violence: Not on file     ROS- All systems are reviewed and negative except as per the HPI above.  Physical Exam: Vitals:   05/01/20 1033  BP: 140/72  Pulse: 65  Weight: 71.2 kg  Height: 5\' 4"  (1.626 m)    GEN- The patient is well appearing elderly female, alert and oriented x 3 today.   Head- normocephalic, atraumatic Eyes-  Sclera clear, conjunctiva pink Ears- hearing intact Oropharynx- clear Neck- supple  Lungs- Clear to ausculation bilaterally, normal work of breathing Heart- Regular rate and rhythm, no murmurs, rubs or gallops  GI- soft, NT, ND, + BS Extremities- no clubbing, cyanosis, or edema MS- no significant  deformity or atrophy Skin- no rash or lesion Psych- euthymic mood, full affect Neuro- strength and sensation are intact  Wt Readings from Last 3 Encounters:  05/01/20 71.2 kg  04/17/20 71.9 kg  03/14/17 69.4 kg    EKG today demonstrates  SR, inc RBBB, LAFB Vent. rate 65 BPM PR interval 152 ms QRS duration 106 ms QT/QTc 410/426 ms  Epic records are reviewed at length today  CHA2DS2-VASc Score = 3  The patient's score is based upon: CHF History: No HTN History: Yes Diabetes History: No Stroke History: No Vascular Disease History: No Age Score: 1 Gender Score: 1      ASSESSMENT AND PLAN: 1. Paroxysmal Atrial  Fibrillation (ICD10:  I48.0) The patient's CHA2DS2-VASc score is 3, indicating a 3.2% annual risk of stroke.   General education about afib provided and questions answered. We also discussed her stroke risk and the risks and benefits of anticoagulation. Will start diltiazem 120 mg daily. Recall she becomes nasusiated with BB. Would avoid class IC with inc RBBB and LAFB if AAD is needed. Check echocardiogram Continue Eliquis 5 mg BID  2. Secondary Hypercoagulable State (ICD10:  D68.69) The patient is at significant risk for stroke/thromboembolism based upon her CHA2DS2-VASc Score of 3.  Continue Apixaban (Eliquis).   3. HTN Stable, med changes as above.    Follow up in AF clinic in 3 weeks.    [facility] [address] [place], [place] [address] [phone] 05/01/2020 10:43 AM

## 2020-05-01 NOTE — Patient Instructions (Signed)
Start Cardizem (Diltiazem) 120mg  once a day

## 2020-05-04 ENCOUNTER — Ambulatory Visit: Payer: Medicare Other | Attending: Family Medicine | Admitting: Physical Therapy

## 2020-05-04 ENCOUNTER — Other Ambulatory Visit: Payer: Self-pay

## 2020-05-04 DIAGNOSIS — M6281 Muscle weakness (generalized): Secondary | ICD-10-CM

## 2020-05-04 DIAGNOSIS — R293 Abnormal posture: Secondary | ICD-10-CM

## 2020-05-04 DIAGNOSIS — M546 Pain in thoracic spine: Secondary | ICD-10-CM | POA: Diagnosis present

## 2020-05-04 DIAGNOSIS — M542 Cervicalgia: Secondary | ICD-10-CM

## 2020-05-04 NOTE — Therapy (Signed)
[facility] [address], [place], [place], [address] Phone: [phone]   Fax:  [phone]  Physical Therapy Treatment/Discharge Summary   Patient Details  Name: Madison Nunez MRN: [id number] Date of Birth: Jun 17, 1945 Referring Provider (PT): Dr. Bernerd Limbo   Encounter Date: 05/04/2020   PT End of Session - 05/04/20 1921    Visit Number 7    Date for PT Re-Evaluation 06/05/20    Authorization Type [employer]HC medicare    PT Start Time 0930    PT Stop Time 1014    PT Time Calculation (min) 44 min    Activity Tolerance Patient tolerated treatment well           Past Medical History:  Diagnosis Date  . Arthritis    hands and lower back  . Atrial fibrillation ([place])    Per patient  . Breast cancer [facility]) 2005   right lumpectomy, chemotherapy with A/C combo  & Taxol, mammogram 02/06/11 without recurrence  . Dyspnea    Echocardiogram 02/20/11: EF 60-65%.;  ETT-myoveiw 12/12 normal with EF 80%  . Hypertension   . Hypothyroidism   . Personal history of chemotherapy 10/2003  . Personal history of radiation therapy 03/2004    Past Surgical History:  Procedure Laterality Date  . APPENDECTOMY  1955  . BREAST BIOPSY  08/29/2003   malignant  . BREAST LUMPECTOMY  2005   right  . TONSILLECTOMY     "as a child"    There were no vitals filed for this visit.   Subjective Assessment - 05/04/20 0928    Subjective I'm going to take it easy today.  They put me on a blood thinner so I'm a little whoozy.  I still feel ready to carry on with my ex's on my own.  Ready to finish up today.    Patient Stated Goals learn how to make back feel better.    Currently in Pain? No/denies    Pain Score 0-No pain              [facility] PT Assessment - 05/04/20 0001      Posture/Postural Control   Posture Comments measure from C7 to T12 35cm   pain with spinal elongation     AROM   Right Shoulder Flexion 145 Degrees    Right Shoulder  ABduction 170 Degrees    Left Shoulder Flexion 156 Degrees    Left Shoulder ABduction 160 Degrees    Cervical Flexion 55    Cervical Extension 40    Cervical - Right Side Bend 30    Cervical - Left Side Bend 20    Cervical - Right Rotation 38    Cervical - Left Rotation 45      Strength   Overall Strength Comments rhomboid and lower trap 3+/5                         [facility] Adult PT Treatment/Exercise - 05/04/20 0001      Lumbar Exercises: Stretches   Other Lumbar Stretch Exercise 1st step psoas with UE reaches 2x5 right/left    Other Lumbar Stretch Exercise pec stretch in doorway with staggered stance for better control      Lumbar Exercises: Aerobic   Nustep L1 seat 8 5 min while discussing progress      Shoulder Exercises: Standing   Extension Limitations review of band HEP    Shoulder Elevation Limitations counter pushups 10x  Other Standing Exercises wall with towel roll at thoracic region red band: overhead, horizontal abduction double and singles; diagonals; Ws 5 reps    Other Standing Exercises discussion of adding 2# weight slides on wall                    PT Short Term Goals - 05/04/20 1928      PT SHORT TERM GOAL #1   Title independent with initial HEP    Status Achieved      PT SHORT TERM GOAL #2   Title back pain decreased >/= 25% due to understand how to correct her posture    Status Achieved      PT SHORT TERM GOAL #3   Title measuring from C7-T12 decreased from 37 cm to 36 cm due to increased in thoracic extension    Status Achieved             PT Long Term Goals - 05/04/20 1928      PT LONG TERM GOAL #1   Title independent with advanced postural exercises    Status Achieved      PT LONG TERM GOAL #2   Title increased shoulder A/ROM for flexion and abduction >/= 10 degrees due to reduction of thoracic kyphosis    Status Achieved      PT LONG TERM GOAL #3   Title thoracic and cervical pain decreased >/= 60% due to  improved ROM and postural strength    Status Partially Met      PT LONG TERM GOAL #4   Title cervical ROM has improved so she is able to look upward with >/= 50% greater ease    Status Achieved      PT LONG TERM GOAL #5   Title able to turn her head to see behind herself >/= 60% greater ease    Status Achieved                 Plan - 05/04/20 0951    Clinical Impression Statement The patient reports she's been less compliant with her HEP the last 3 weeks secondary to not feeling well and her diagnosis of A-fib.  However, she expresses readiness to carry on with her HEP independently.  Treatment focus on review of her comprehensive HEP to discuss modifications and progressions for further improvement over time.  Her cervical ROM and shoulder elevation ROM has improved significantly since start of care.  Her thoracic kyphosis has also decreased with C7-T12 distance decreased by 2-3 cm.  Recommend discharge from PT at this time with partial goals met.    PT Home Exercise Plan Access Code: [email or web address]           Patient will benefit from skilled therapeutic intervention in order to improve the following deficits and impairments:     Visit Diagnosis: Muscle weakness (generalized)  Cervicalgia  Pain in thoracic spine  Abnormal posture    PHYSICAL THERAPY DISCHARGE SUMMARY  Visits from Start of Care: 7  Current functional level related to goals / functional outcomes: See clinical impressions above    Remaining deficits:as above   Education / Equipment: HEP Plan: Patient agrees to discharge.  Patient goals were met. Patient is being discharged due to meeting the stated rehab goals.  ?????         Problem List Patient Active Problem List   Diagnosis Date Noted  . Paroxysmal atrial fibrillation ([place]) 05/01/2020  . Secondary hypercoagulable state ([place]) 05/01/2020  . Chest  pain, rule out acute myocardial infarction 02/03/2017  . Palpitations   . Hyponatremia  10/13/2012  . Acute upper respiratory infections of unspecified site 10/13/2012  . Dizziness 03/07/2011  . Shortness of breath 02/19/2011  . Nausea alone 02/19/2011  . Labile hypertension 02/19/2011  . Hypothyroidism 02/19/2011   Ruben Im, PT 05/04/20 7:30 PM Phone: [phone] Fax: [phone] [name] 05/04/2020, 7:29 PM  [facility] [address], [place], [place], [address] Phone: [phone]   Fax:  [phone]  Name: Madison Nunez MRN: [id number] Date of Birth: 04-06-1945

## 2020-05-08 ENCOUNTER — Telehealth (HOSPITAL_COMMUNITY): Payer: Self-pay | Admitting: *Deleted

## 2020-05-08 NOTE — Telephone Encounter (Signed)
Patient called stating she took her first dose of cardizem 120mg  once a day last PM. Woke up this morning feeling very "spacey" out of sorts and wobbly.  HR is 46 BP 113/76. Discussed with Adline Peals PA will stop cardizem and keep scheduled follow up. Pt reports since visit last week she has not had any episodes.

## 2020-05-09 MED ORDER — DILTIAZEM HCL 30 MG PO TABS: ORAL_TABLET | ORAL | 1 refills | Status: DC

## 2020-05-09 NOTE — Telephone Encounter (Signed)
Patient calls today stating she woke up feeling weak and nauseated. Stated her HR was 52 pt was very anxious (nearly panicking)  on the phone. When I asked her to take her BP/HR while on the phone it was 180/94 HR 106. Discussed with Adline Peals PA will call in as needed diltiazem 30mg  to use for elevated rates. Pt will call back later in the week with update of response to PRN cardizem. Offered to move pt appt up for earlier follow up but pt states she will call.

## 2020-05-09 NOTE — Addendum Note (Signed)
Addended by: Juluis Mire on: 05/09/2020 11:38 AM   Modules accepted: Orders

## 2020-05-23 ENCOUNTER — Encounter (HOSPITAL_COMMUNITY): Payer: Self-pay | Admitting: Physician Assistant

## 2020-05-23 ENCOUNTER — Ambulatory Visit (HOSPITAL_BASED_OUTPATIENT_CLINIC_OR_DEPARTMENT_OTHER)
Admission: RE | Admit: 2020-05-23 | Discharge: 2020-05-23 | Disposition: A | Discharge status: Home / Self Care | Payer: Medicare Other | Source: Ambulatory Visit | Attending: Physician Assistant | Admitting: Physician Assistant

## 2020-05-23 ENCOUNTER — Ambulatory Visit (HOSPITAL_COMMUNITY)
Admission: RE | Admit: 2020-05-23 | Discharge: 2020-05-23 | Disposition: A | Discharge status: Home / Self Care | Payer: Medicare Other | Source: Ambulatory Visit | Attending: Physician Assistant | Admitting: Physician Assistant

## 2020-05-23 ENCOUNTER — Other Ambulatory Visit: Payer: Self-pay

## 2020-05-23 VITALS — BP 142/92 | HR 71 | Ht 64.0 in | Wt 159.0 lb

## 2020-05-23 DIAGNOSIS — I517 Cardiomegaly: Secondary | ICD-10-CM | POA: Diagnosis not present

## 2020-05-23 DIAGNOSIS — Z79890 Hormone replacement therapy (postmenopausal): Secondary | ICD-10-CM | POA: Diagnosis not present

## 2020-05-23 DIAGNOSIS — I4891 Unspecified atrial fibrillation: Secondary | ICD-10-CM | POA: Insufficient documentation

## 2020-05-23 DIAGNOSIS — I10 Essential (primary) hypertension: Secondary | ICD-10-CM | POA: Diagnosis not present

## 2020-05-23 DIAGNOSIS — E039 Hypothyroidism, unspecified: Secondary | ICD-10-CM | POA: Diagnosis not present

## 2020-05-23 DIAGNOSIS — Z87891 Personal history of nicotine dependence: Secondary | ICD-10-CM | POA: Insufficient documentation

## 2020-05-23 DIAGNOSIS — Z8249 Family history of ischemic heart disease and other diseases of the circulatory system: Secondary | ICD-10-CM | POA: Diagnosis not present

## 2020-05-23 DIAGNOSIS — D6869 Other thrombophilia: Secondary | ICD-10-CM | POA: Insufficient documentation

## 2020-05-23 DIAGNOSIS — I480 Paroxysmal atrial fibrillation: Secondary | ICD-10-CM | POA: Diagnosis not present

## 2020-05-23 DIAGNOSIS — Z79899 Other long term (current) drug therapy: Secondary | ICD-10-CM | POA: Insufficient documentation

## 2020-05-23 DIAGNOSIS — Z7901 Long term (current) use of anticoagulants: Secondary | ICD-10-CM | POA: Insufficient documentation

## 2020-05-23 DIAGNOSIS — Z853 Personal history of malignant neoplasm of breast: Secondary | ICD-10-CM | POA: Insufficient documentation

## 2020-05-23 LAB — ECHOCARDIOGRAM COMPLETE
AR max vel: 1.52 cm2
AV Area VTI: 1.41 cm2
AV Area mean vel: 1.46 cm2
AV Mean grad: 3 mmHg
AV Peak grad: 5.2 mmHg
Ao pk vel: 1.14 m/s
Area-P 1/2: 3.63 cm2
S' Lateral: 2.6 cm

## 2020-05-23 NOTE — [place] Notes (Signed)
  Echocardiogram 2D Echocardiogram has been performed.  Merrie Roof F 05/23/2020, 2:19 PM

## 2020-05-23 NOTE — Progress Notes (Signed)
Primary Care Physician: Bernerd Limbo, MD Primary Cardiologist: Dr Debara Pickett Primary Electrophysiologist: none Referring Physician: Dr Delton Coombes is a 75 y.o. female with a history of HTN, hypothyroidism, breast cancer, and atrial fibrillation who presents for follow up in the [facility]. The patient was initially diagnosed with atrial fibrillation 04/15/20 after presenting to the ED with symptoms of palpitations. ECG showed afib with RVR. She converted back to SR in the ED. Patient is on Eliquis for a CHADS2VASC score of 3. She does report that she has had palpitations for years but had never been diagnosed with afib.   On follow up today, patient did not tolerate daily diltiazem due to bradycardia and dizziness. She was started on PRN diltiazem. She reports that her heart racing has improved and she has not had to take any PRN CCB. She denies any bleeding issues on anticoagulation. She does report near constant mild dizziness, not associated with any other symptoms. This apparently has been chronic.   Today, she denies symptoms of chest pain, shortness of breath, orthopnea, PND, lower extremity edema, presyncope, syncope, snoring, daytime somnolence, bleeding, or neurologic sequela. The patient is tolerating medications without difficulties and is otherwise without complaint today.    Atrial Fibrillation Risk Factors:  she does not have symptoms or diagnosis of sleep apnea. she does not have a history of rheumatic fever. she does have a history of alcohol use. The patient does not have a history of early familial atrial fibrillation or other arrhythmias.  she has a BMI of Body mass index is 27.29 kg/m.Marland Kitchen Filed Weights   05/23/20 1426  Weight: 72.1 kg    Family History  Problem Relation Age of Onset  . Coronary artery disease Brother 49       died of first MI at 72  . Coronary artery disease Father        had juvenile DM most of his life, had 3  MI's the first in his early 29's, died at 68  . Diabetes Father   . Breast cancer Maternal Grandmother      Atrial Fibrillation Management history:  Previous antiarrhythmic drugs: none Previous cardioversions: none Previous ablations: none CHADS2VASC score: 3 Anticoagulation history: Eliquis   Past Medical History:  Diagnosis Date  . Arthritis    hands and lower back  . Atrial fibrillation ([place])    Per patient  . Breast cancer [facility]) 2005   right lumpectomy, chemotherapy with A/C combo  & Taxol, mammogram 02/06/11 without recurrence  . Dyspnea    Echocardiogram 02/20/11: EF 60-65%.;  ETT-myoveiw 12/12 normal with EF 80%  . Hypertension   . Hypothyroidism   . Personal history of chemotherapy 10/2003  . Personal history of radiation therapy 03/2004   Past Surgical History:  Procedure Laterality Date  . APPENDECTOMY  1955  . BREAST BIOPSY  08/29/2003   malignant  . BREAST LUMPECTOMY  2005   right  . TONSILLECTOMY     "as a child"    Current Outpatient Medications  Medication Sig Dispense Refill  . apixaban (ELIQUIS) 5 MG TABS tablet Take 1 tablet (5 mg total) by mouth 2 (two) times daily. 60 tablet 6  . ascorbic Acid (VITAMIN C) 500 MG CPCR Take 500 mg by mouth daily.    . B Complex Vitamins (VITAMIN B COMPLEX) TABS Take 0.5-1 tablets by mouth in the morning.    . Calcium Carbonate-Vitamin D (CALTRATE 600+D PO) Take 1 tablet by  mouth daily with lunch.    . Cholecalciferol (VITAMIN D3) 50 MCG (2000 UT) TABS Take 2,000 Units by mouth daily.    Marland Kitchen diltiazem (CARDIZEM) 30 MG tablet Take 1 tablet every 4 hours as needed for heart rates over 100 15 tablet 1  . Flaxseed Oil OIL Take 1,300 mg by mouth in the morning and at bedtime.    Marland Kitchen latanoprost (XALATAN) 0.005 % ophthalmic solution Place 1 drop into both eyes at bedtime.    Marland Kitchen levothyroxine (SYNTHROID, LEVOTHROID) 50 MCG tablet Take 50 mcg by mouth daily.    Marland Kitchen lisinopril (PRINIVIL,ZESTRIL) 40 MG tablet Take 1 tablet (40 mg  total) by mouth daily. 30 tablet 0  . Multiple Vitamins-Minerals (CENTRUM SILVER 50+WOMEN) TABS Take 1 tablet by mouth daily with breakfast.    . TURMERIC PO Take 500 mg by mouth daily.     No current facility-administered medications for this encounter.    Allergies  Allergen Reactions  . Hctz [Hydrochlorothiazide] Other (See Comments)    Hyponatremia (leading to hospitalization)  . Beta Adrenergic Blockers Nausea Only and Other (See Comments)    Metoprolol and Carvedilol have stopped working for patient over time, causing nausea also  . Prednisone Other (See Comments)    Had reaction with use prior to chemo- ONLY IN HIGHER DOSES    Social History   Socioeconomic History  . Marital status: Divorced    Spouse name: Not on file  . Number of children: Not on file  . Years of education: Not on file  . Highest education level: Not on file  Occupational History  . Not on file  Tobacco Use  . Smoking status: Former Smoker    Packs/day: 1.00    Years: 5.00    Pack years: 5.00    Types: Cigarettes  . Smokeless tobacco: Never Used  Substance and Sexual Activity  . Alcohol use: Not Currently    Alcohol/week: 7.0 standard drinks    Types: 7 Glasses of wine per week  . Drug use: No  . Sexual activity: Never  Other Topics Concern  . Not on file  Social History Narrative   Lives alone.  Drives.     Social Determinants of Health   Financial Resource Strain: Not on file  Food Insecurity: Not on file  Transportation Needs: Not on file  Physical Activity: Not on file  Stress: Not on file  Social Connections: Not on file  Intimate Partner Violence: Not on file     ROS- All systems are reviewed and negative except as per the HPI above.  Physical Exam: Vitals:   05/23/20 1426  BP: (!) 142/92  Pulse: 71  Weight: 72.1 kg  Height: 5\' 4"  (1.626 m)    GEN- The patient is a well appearing elderly female, alert and oriented x 3 today.   HEENT-head normocephalic, atraumatic,  sclera clear, conjunctiva pink, hearing intact, trachea midline. Lungs- Clear to ausculation bilaterally, normal work of breathing Heart- Regular rate and rhythm, no murmurs, rubs or gallops  GI- soft, NT, ND, + BS Extremities- no clubbing, cyanosis, or edema MS- no significant deformity or atrophy Skin- no rash or lesion Psych- euthymic mood, full affect Neuro- strength and sensation are intact   Wt Readings from Last 3 Encounters:  05/23/20 72.1 kg  05/01/20 71.2 kg  04/17/20 71.9 kg    EKG today demonstrates  SR, inc RBBB, LAFB Vent. rate 71 BPM PR interval 154 ms QRS duration 104 ms QT/QTc 404/439 ms  Epic records are reviewed at length today  CHA2DS2-VASc Score = 3  The patient's score is based upon: CHF History: No HTN History: Yes Diabetes History: No Stroke History: No Vascular Disease History: No Age Score: 1 Gender Score: 1      ASSESSMENT AND PLAN: 1. Paroxysmal Atrial Fibrillation (ICD10:  I48.0) The patient's CHA2DS2-VASc score is 3, indicating a 3.2% annual risk of stroke.   Patient appears to be maintaining SR. Recall she becomes nasusiated with BB. Would avoid class IC with inc RBBB and LAFB if AAD is needed. Could consider Multaq, dofetilide, or ablation. She is anxious about starting new medications.  Continue Eliquis 5 mg BID Continue diltiazem 30 mg PRN q 4 hours for heart racing.  Echocardiogram results pending.   2. Secondary Hypercoagulable State (ICD10:  D68.69) The patient is at significant risk for stroke/thromboembolism based upon her CHA2DS2-VASc Score of 3.  Continue Apixaban (Eliquis).   3. HTN Stable, no changes today.   Follow up with Dr Debara Pickett as scheduled. AF clinic in 3 months.    [facility] [address] [place], [place] [address] [phone] 05/23/2020 4:46 PM

## 2020-05-25 ENCOUNTER — Encounter (HOSPITAL_COMMUNITY): Payer: Self-pay | Admitting: *Deleted

## 2020-06-12 ENCOUNTER — Other Ambulatory Visit: Payer: Self-pay

## 2020-06-12 ENCOUNTER — Ambulatory Visit
Admission: RE | Admit: 2020-06-12 | Discharge: 2020-06-12 | Disposition: A | Discharge status: Home / Self Care | Payer: Medicare Other | Source: Ambulatory Visit | Attending: Family Medicine | Admitting: Family Medicine

## 2020-06-12 DIAGNOSIS — R921 Mammographic calcification found on diagnostic imaging of breast: Secondary | ICD-10-CM

## 2020-06-13 NOTE — Telephone Encounter (Signed)
Spoke to patient Madison Memos NP advised you may continue Eliquis for dental cleaning.Advised no antibiotics needed.

## 2020-07-18 ENCOUNTER — Ambulatory Visit: Payer: Medicare Other | Admitting: Internal Medicine

## 2020-07-18 ENCOUNTER — Other Ambulatory Visit: Payer: Self-pay

## 2020-07-18 ENCOUNTER — Encounter: Payer: Self-pay | Admitting: Internal Medicine

## 2020-07-18 VITALS — BP 132/80 | HR 72 | Ht 64.5 in | Wt 164.0 lb

## 2020-07-18 DIAGNOSIS — I10 Essential (primary) hypertension: Secondary | ICD-10-CM | POA: Diagnosis not present

## 2020-07-18 DIAGNOSIS — R5383 Other fatigue: Secondary | ICD-10-CM

## 2020-07-18 DIAGNOSIS — I480 Paroxysmal atrial fibrillation: Secondary | ICD-10-CM

## 2020-07-18 DIAGNOSIS — G478 Other sleep disorders: Secondary | ICD-10-CM | POA: Diagnosis not present

## 2020-07-18 NOTE — Patient Instructions (Signed)
Medication Instructions:  Your physician recommends that you continue on your current medications as directed. Please refer to the Current Medication list given to you today.  *If you need a refill on your cardiac medications before your next appointment, please call your pharmacy*   Testing/Procedures: Home Sleep Test - our office coordinator will contact you about an appointment   Follow-Up: At [facility], you and your health needs are our priority.  As part of our continuing mission to provide you with exceptional heart care, we have created designated Provider Care Teams.  These Care Teams include your primary Cardiologist (physician) and Advanced Practice Providers (APPs -  Physician Assistants and Nurse Practitioners) who all work together to provide you with the care you need, when you need it.  We recommend signing up for the patient portal called "MyChart".  Sign up information is provided on this After Visit Summary.  MyChart is used to connect with patients for Virtual Visits (Telemedicine).  Patients are able to view lab/test results, encounter notes, upcoming appointments, etc.  Non-urgent messages can be sent to your provider as well.   To learn more about what you can do with MyChart, go to [email or web address].    Your next appointment:   12 month(s)  The format for your next appointment:   In Person  Provider:   You may see Pixie Casino, MD or one of the following Advanced Practice Providers on your designated Care Team:    Almyra Deforest, PA-C  Fabian Sharp, PA-C or   Roby Lofts, [place]    Other Instructions

## 2020-07-18 NOTE — Progress Notes (Signed)
OFFICE NOTE  Chief Complaint:  Follow-up palpitations for monitor  Primary Care Physician: Bernerd Limbo, MD  HPI:  Madison Nunez is a 75 y.o. female with a past medial history significant for breast CA in remission (port still in place), family history of CAD, possible mitral valve prolapse, HTN, hyperlipidemia and anxiety. She presents with several days of labile hypertension, flushing and palpitations, but no complaints of frank chest pain. Troponin has been negative x 5. EKG personally reviewed shows a sinus tachy, LVH with strain, incomplete RBBB. She was previously on carvedilol but then developed flushing. Was on metoprolol, but I'm not sure that controlled her BP - also on lisinopril. Switched by PCP to amlodipine and feels she is now having the same symptoms of flushing. During her hospitalization, I advised stopping her amlodipine and starting low dose toprol. A 30 day monitor was ordered. I have personally reviewed her monitor which shows no significant arrhythmias or extrasystoles. She reports feeling well without further palpitations. She is not taking her b-blocker.  07/18/2020  [name] returns today for follow-up.  She has been seen most recently by the A. fib clinic.  She is now on Eliquis.  She was taking long-acting diltiazem but had side effects with it and felt like she was in a fog.  She is now using diltiazem as needed.  She had several episodes of atrial fibrillation but probably has less than 4 or 5 events a month.  She uses diltiazem as needed for that.  There was some discussion between her and Mr. Madison Nunez about antiarrhythmic therapy.  I mentioned that today.  She also feels that she has some fatigue.  This might be related to A. fib or other possible causes.  We discussed the possibility of sleep apnea.  She says she lives alone and is not aware that she snores but does feel tired during the day has to take a nap and oftentimes has a nonrestorative sleep at  night.  PMHx:  Past Medical History:  Diagnosis Date  . Arthritis    hands and lower back  . Atrial fibrillation ([place])    Per patient  . Breast cancer [facility]) 2005   right lumpectomy, chemotherapy with A/C combo  & Taxol, mammogram 02/06/11 without recurrence  . Dyspnea    Echocardiogram 02/20/11: EF 60-65%.;  ETT-myoveiw 12/12 normal with EF 80%  . Hypertension   . Hypothyroidism   . Personal history of chemotherapy 10/2003  . Personal history of radiation therapy 03/2004    Past Surgical History:  Procedure Laterality Date  . APPENDECTOMY  1955  . BREAST BIOPSY  08/29/2003   malignant  . BREAST LUMPECTOMY  2005   right  . TONSILLECTOMY     "as a child"    FAMHx:  Family History  Problem Relation Age of Onset  . Coronary artery disease Brother 73       died of first MI at 38  . Coronary artery disease Father        had juvenile DM most of his life, had 3 MI's the first in his early 46's, died at 21  . Diabetes Father   . Breast cancer Maternal Grandmother     SOCHx:   reports that she has quit smoking. Her smoking use included cigarettes. She has a 5.00 pack-year smoking history. She has never used smokeless tobacco. She reports previous alcohol use of about 7.0 standard drinks of alcohol per week. She reports that she does not use  drugs.  ALLERGIES:  Allergies  Allergen Reactions  . Hctz [Hydrochlorothiazide] Other (See Comments)    Hyponatremia (leading to hospitalization)  . Beta Adrenergic Blockers Nausea Only and Other (See Comments)    Metoprolol and Carvedilol have stopped working for patient over time, causing nausea also  . Prednisone Other (See Comments)    Had reaction with use prior to chemo- ONLY IN HIGHER DOSES    ROS: Pertinent items noted in HPI and remainder of comprehensive ROS otherwise negative.  HOME MEDS: Current Outpatient Medications on File Prior to Visit  Medication Sig Dispense Refill  . apixaban (ELIQUIS) 5 MG TABS tablet Take 1  tablet (5 mg total) by mouth 2 (two) times daily. 60 tablet 6  . ascorbic Acid (VITAMIN C) 500 MG CPCR Take 500 mg by mouth daily.    . B Complex Vitamins (VITAMIN B COMPLEX) TABS Take 0.5-1 tablets by mouth in the morning.    . Cholecalciferol (VITAMIN D3) 50 MCG (2000 UT) TABS Take 2,000 Units by mouth daily.    Marland Kitchen diltiazem (CARDIZEM) 30 MG tablet Take 1 tablet every 4 hours as needed for heart rates over 100 15 tablet 1  . Flaxseed Oil OIL Take 1,300 mg by mouth in the morning and at bedtime.    Marland Kitchen latanoprost (XALATAN) 0.005 % ophthalmic solution Place 1 drop into both eyes at bedtime.    Marland Kitchen levothyroxine (SYNTHROID, LEVOTHROID) 50 MCG tablet Take 50 mcg by mouth daily.    Marland Kitchen lisinopril (PRINIVIL,ZESTRIL) 40 MG tablet Take 1 tablet (40 mg total) by mouth daily. 30 tablet 0  . Multiple Vitamins-Minerals (CENTRUM SILVER 50+WOMEN) TABS Take 1 tablet by mouth daily with breakfast.    . TURMERIC PO Take 500 mg by mouth daily.     No current facility-administered medications on file prior to visit.    LABS/IMAGING: No results found for this or any previous visit (from the past 48 hour(s)). No results found.  LIPID PANEL: No results found for: CHOL, TRIG, HDL, CHOLHDL, VLDL, LDLCALC, LDLDIRECT   WEIGHTS: Wt Readings from Last 3 Encounters:  07/18/20 164 lb (74.4 kg)  05/23/20 159 lb (72.1 kg)  05/01/20 157 lb (71.2 kg)    VITALS: BP 132/80 (BP Location: Left Arm, Patient Position: Sitting, Cuff Size: Normal)   Pulse 72   Ht 5' 4.5" (1.638 m)   Wt 164 lb (74.4 kg)   LMP 02/19/2011   BMI 27.72 kg/m   EXAM: General appearance: alert and no distress Neck: no carotid bruit, no JVD and thyroid not enlarged, symmetric, no tenderness/mass/nodules Lungs: clear to auscultation bilaterally Heart: regular rate and rhythm, S1, S2 normal, no murmur, click, rub or gallop Abdomen: soft, non-tender; bowel sounds normal; no masses,  no organomegaly Extremities: extremities normal, atraumatic, no  cyanosis or edema Pulses: 2+ and symmetric Skin: Skin color, texture, turgor normal. No rashes or lesions Neurologic: Grossly normal Psych: Pleasant  EKG: Normal sinus rhythm at 72, left axis deviation and incomplete right bundle branch block, moderate voltage criteria for LVH-personally reviewed  ASSESSMENT: 1. PAF-on Eliquis 2. Hypertension 3. Fatigue, nonrestorative sleep  PLAN: 1.   Madison Nunez was ultimately found to have A. fib.  This is paroxysmal and seems to be controlled with intermittent diltiazem at this point.  She has fatigue which might be out of proportion for her episodes of A. fib.  She does report some nonrestorative sleep and I think it would be reasonable to screen for sleep apnea given her A. fib.  I would recommend a home sleep study.  She does not have any other person in that is around her when she sleeps to determine whether she snores or stops breathing.  She is in sinus rhythm today.  She seems to be tolerating the Eliquis.  Hemoglobin was 14 recently.  Blood pressure appears well controlled.  She has follow-up with the A. fib clinic in the summer.  If her sleep study is abnormal then she will be referred to our sleep specialist.  Otherwise follow-up with me annually or sooner as necessary  Pixie Casino, MD, [facility], [place] Director of the Advanced Lipid Disorders &  Cardiovascular Risk Reduction Clinic Diplomate of the American Board of Clinical Lipidology Attending Cardiologist  Direct Dial: [phone]  Fax: [phone]  Website:  [email or web address] Damoni Erker 07/18/2020, 11:10 AM

## 2020-07-24 ENCOUNTER — Telehealth: Payer: Self-pay | Admitting: *Deleted

## 2020-07-24 NOTE — Telephone Encounter (Signed)
Patient notified of HST appointment details. °

## 2020-07-24 NOTE — Telephone Encounter (Signed)
-----   Message from Fidel Levy, RN sent at 07/18/2020 11:34 AM EDT ----- Regarding: home sleep test Hello --   Dr. Debara Pickett has ordered a home sleep test for this patient PAF, fatigue, non-restorative sleep  Thanks

## 2020-08-22 ENCOUNTER — Ambulatory Visit (HOSPITAL_BASED_OUTPATIENT_CLINIC_OR_DEPARTMENT_OTHER): Payer: Medicare Other | Admitting: Cardiovascular Disease

## 2020-08-22 ENCOUNTER — Other Ambulatory Visit: Payer: Self-pay

## 2020-08-22 DIAGNOSIS — R5383 Other fatigue: Secondary | ICD-10-CM

## 2020-08-22 DIAGNOSIS — G478 Other sleep disorders: Secondary | ICD-10-CM

## 2020-08-22 DIAGNOSIS — I480 Paroxysmal atrial fibrillation: Secondary | ICD-10-CM

## 2020-08-23 ENCOUNTER — Encounter (HOSPITAL_COMMUNITY): Payer: Self-pay | Admitting: Physician Assistant

## 2020-08-23 ENCOUNTER — Ambulatory Visit (HOSPITAL_COMMUNITY)
Admission: RE | Admit: 2020-08-23 | Discharge: 2020-08-23 | Disposition: A | Discharge status: Home / Self Care | Payer: Medicare Other | Source: Ambulatory Visit | Attending: Physician Assistant | Admitting: Physician Assistant

## 2020-08-23 VITALS — BP 110/78 | HR 79 | Ht 64.5 in | Wt 158.6 lb

## 2020-08-23 DIAGNOSIS — Z87891 Personal history of nicotine dependence: Secondary | ICD-10-CM | POA: Diagnosis not present

## 2020-08-23 DIAGNOSIS — I4510 Unspecified right bundle-branch block: Secondary | ICD-10-CM | POA: Diagnosis not present

## 2020-08-23 DIAGNOSIS — D6869 Other thrombophilia: Secondary | ICD-10-CM | POA: Diagnosis not present

## 2020-08-23 DIAGNOSIS — I10 Essential (primary) hypertension: Secondary | ICD-10-CM | POA: Insufficient documentation

## 2020-08-23 DIAGNOSIS — I480 Paroxysmal atrial fibrillation: Secondary | ICD-10-CM | POA: Diagnosis not present

## 2020-08-23 DIAGNOSIS — Z8249 Family history of ischemic heart disease and other diseases of the circulatory system: Secondary | ICD-10-CM | POA: Insufficient documentation

## 2020-08-23 DIAGNOSIS — Z7901 Long term (current) use of anticoagulants: Secondary | ICD-10-CM | POA: Insufficient documentation

## 2020-08-23 DIAGNOSIS — E039 Hypothyroidism, unspecified: Secondary | ICD-10-CM | POA: Insufficient documentation

## 2020-08-23 DIAGNOSIS — G4733 Obstructive sleep apnea (adult) (pediatric): Secondary | ICD-10-CM | POA: Diagnosis not present

## 2020-08-23 DIAGNOSIS — Z79899 Other long term (current) drug therapy: Secondary | ICD-10-CM | POA: Insufficient documentation

## 2020-08-23 NOTE — Progress Notes (Signed)
Primary Care Physician: Bernerd Limbo, MD Primary Cardiologist: Dr Debara Pickett Primary Electrophysiologist: none Referring Physician: Dr Delton Coombes is a 75 y.o. female with a history of HTN, hypothyroidism, breast cancer, and atrial fibrillation who presents for follow up in the [facility]. The patient was initially diagnosed with atrial fibrillation 04/15/20 after presenting to the ED with symptoms of palpitations. ECG showed afib with RVR. She converted back to SR in the ED. Patient is on Eliquis for a CHADS2VASC score of 3. She does report that she has had palpitations for years but had never been diagnosed with afib. Patient did not tolerate daily diltiazem due to bradycardia and dizziness. She was started on PRN diltiazem.   On follow up today, patient reports she has done well since her last visit. She has an afib episode about once every 3-4 weeks which lasts for close to 2 hours. She denies any bleeding issues on anticoagulation. She does have some erythremia around her eyes which has been ongoing for a couple weeks. She does admit to changing her makeup brand around that time.   Today, she denies symptoms of chest pain, shortness of breath, orthopnea, PND, lower extremity edema, presyncope, syncope, snoring, daytime somnolence, bleeding, or neurologic sequela. The patient is tolerating medications without difficulties and is otherwise without complaint today.    Atrial Fibrillation Risk Factors:  she does have symptoms or diagnosis of sleep apnea. she does not have a history of rheumatic fever. she does have a history of alcohol use. The patient does not have a history of early familial atrial fibrillation or other arrhythmias.  she has a BMI of Body mass index is 26.8 kg/m.Marland Kitchen Filed Weights   08/23/20 1325  Weight: 71.9 kg     Family History  Problem Relation Age of Onset   Coronary artery disease Brother 32       died of first MI at 75    Coronary artery disease Father        had juvenile DM most of his life, had 3 MI's the first in his early 31's, died at 55   Diabetes Father    Breast cancer Maternal Grandmother      Atrial Fibrillation Management history:  Previous antiarrhythmic drugs: none Previous cardioversions: none Previous ablations: none CHADS2VASC score: 3 Anticoagulation history: Eliquis   Past Medical History:  Diagnosis Date   Arthritis    hands and lower back   Atrial fibrillation [facility])    Per patient   Breast cancer ([place]) 2005   right lumpectomy, chemotherapy with A/C combo  & Taxol, mammogram 02/06/11 without recurrence   Dyspnea    Echocardiogram 02/20/11: EF 60-65%.;  ETT-myoveiw 12/12 normal with EF 80%   Hypertension    Hypothyroidism    Personal history of chemotherapy 10/2003   Personal history of radiation therapy 03/2004   Past Surgical History:  Procedure Laterality Date   APPENDECTOMY  1955   BREAST BIOPSY  08/29/2003   malignant   BREAST LUMPECTOMY  2005   right   TONSILLECTOMY     "as a child"    Current Outpatient Medications  Medication Sig Dispense Refill   apixaban (ELIQUIS) 5 MG TABS tablet Take 1 tablet (5 mg total) by mouth 2 (two) times daily. 60 tablet 6   ascorbic Acid (VITAMIN C) 500 MG CPCR Take 500 mg by mouth daily.     B Complex Vitamins (VITAMIN B COMPLEX) TABS Take 0.5-1 tablets by  mouth in the morning.     CEQUA 0.09 % SOLN Apply 1 drop to eye 2 (two) times daily.     Cholecalciferol (VITAMIN D3) 50 MCG (2000 UT) TABS Take 2,000 Units by mouth daily.     diltiazem (CARDIZEM) 30 MG tablet Take 1 tablet every 4 hours as needed for heart rates over 100 15 tablet 1   Flaxseed Oil OIL Take 1,300 mg by mouth in the morning and at bedtime.     latanoprost (XALATAN) 0.005 % ophthalmic solution Place 1 drop into both eyes at bedtime.     levothyroxine (SYNTHROID, LEVOTHROID) 50 MCG tablet Take 50 mcg by mouth daily.     lisinopril (PRINIVIL,ZESTRIL) 40 MG  tablet Take 1 tablet (40 mg total) by mouth daily. 30 tablet 0   Multiple Vitamins-Minerals (CENTRUM SILVER 50+WOMEN) TABS Take 1 tablet by mouth daily with breakfast.     TURMERIC PO Take 500 mg by mouth daily.     No current facility-administered medications for this encounter.    Allergies  Allergen Reactions   Hctz [Hydrochlorothiazide] Other (See Comments)    Hyponatremia (leading to hospitalization)   Beta Adrenergic Blockers Nausea Only and Other (See Comments)    Metoprolol and Carvedilol have stopped working for patient over time, causing nausea also   Prednisone Other (See Comments)    Had reaction with use prior to chemo- ONLY IN HIGHER DOSES    Social History   Socioeconomic History   Marital status: Divorced    Spouse name: Not on file   Number of children: Not on file   Years of education: Not on file   Highest education level: Not on file  Occupational History   Not on file  Tobacco Use   Smoking status: Former    Packs/day: 1.00    Years: 5.00    Pack years: 5.00    Types: Cigarettes   Smokeless tobacco: Never  Substance and Sexual Activity   Alcohol use: Not Currently    Alcohol/week: 7.0 standard drinks    Types: 7 Glasses of wine per week   Drug use: No   Sexual activity: Never  Other Topics Concern   Not on file  Social History Narrative   Lives alone.  Drives.     Social Determinants of Health   Financial Resource Strain: Not on file  Food Insecurity: Not on file  Transportation Needs: Not on file  Physical Activity: Not on file  Stress: Not on file  Social Connections: Not on file  Intimate Partner Violence: Not on file     ROS- All systems are reviewed and negative except as per the HPI above.  Physical Exam: Vitals:   08/23/20 1325  BP: 110/78  Pulse: 79  Weight: 71.9 kg  Height: 5' 4.5" (1.638 m)     GEN- The patient is a well appearing elderly female, alert and oriented x 3 today.   HEENT-head normocephalic, atraumatic,  sclera clear, conjunctiva pink, hearing intact, trachea midline. Lungs- Clear to ausculation bilaterally, normal work of breathing Heart- Regular rate and rhythm, no murmurs, rubs or gallops  GI- soft, NT, ND, + BS Extremities- no clubbing, cyanosis, or edema MS- no significant deformity or atrophy Skin- erythema upper lids of both eyes Psych- euthymic mood, full affect Neuro- strength and sensation are intact   Wt Readings from Last 3 Encounters:  08/23/20 71.9 kg  07/18/20 74.4 kg  05/23/20 72.1 kg    EKG today demonstrates  SR, inc RBBB,  LAFB Vent. rate 79 BPM PR interval 160 ms QRS duration 106 ms QT/QTcB 380/435 ms  Echo 05/23/20 demonstrated  1. Left ventricular ejection fraction, by estimation, is 55 to 60%. The  left ventricle has normal function. The left ventricle has no regional  wall motion abnormalities. There is mild left ventricular hypertrophy of  the basal-septal segment. Left  ventricular diastolic parameters are consistent with Grade I diastolic  dysfunction (impaired relaxation).   2. Right ventricular systolic function is normal. The right ventricular  size is normal. There is normal pulmonary artery systolic pressure.   3. Left atrial size was mildly dilated.   4. The mitral valve is normal in structure. Trivial mitral valve  regurgitation. No evidence of mitral stenosis.   5. The aortic valve is tricuspid. Aortic valve regurgitation is not  visualized. No aortic stenosis is present.   6. The inferior vena cava is normal in size with greater than 50%  respiratory variability, suggesting right atrial pressure of 3 mmHg.   Epic records are reviewed at length today  CHA2DS2-VASc Score = 3  The patient's score is based upon: CHF History: No HTN History: Yes Diabetes History: No Stroke History: No Vascular Disease History: No Age Score: 1 Gender Score: 1      ASSESSMENT AND PLAN: 1. Paroxysmal Atrial Fibrillation (ICD10:  I48.0) The patient's  CHA2DS2-VASc score is 3, indicating a 3.2% annual risk of stroke.   Patient in [place] today. She is satisfied with her present therapy for afib and does not want to make any changes. Would avoid class IC with inc RBBB and LAFB. Could consider Multaq, dofetilide, or ablation if her afib becomes more frequent/persistent. Continue Eliquis 5 mg BID Continue diltiazem 30 mg PRN q 4 hours for heart racing.   2. Secondary Hypercoagulable State (ICD10:  D68.69) The patient is at significant risk for stroke/thromboembolism based upon her CHA2DS2-VASc Score of 3.  Continue Apixaban (Eliquis).   3. HTN Stable, no changes today.  4. Suspected OSA Patient completed home sleep study last night. Results pending.    Follow up in the AF clinic in 6 months.    [facility] [address] [place], [place] [address] [phone] 08/23/2020 1:36 PM

## 2020-08-28 ENCOUNTER — Other Ambulatory Visit: Payer: Self-pay

## 2020-08-28 ENCOUNTER — Ambulatory Visit (HOSPITAL_BASED_OUTPATIENT_CLINIC_OR_DEPARTMENT_OTHER): Payer: Medicare Other | Attending: Internal Medicine | Admitting: Cardiovascular Disease

## 2020-08-28 DIAGNOSIS — Z7901 Long term (current) use of anticoagulants: Secondary | ICD-10-CM | POA: Insufficient documentation

## 2020-08-28 DIAGNOSIS — R0902 Hypoxemia: Secondary | ICD-10-CM | POA: Diagnosis not present

## 2020-08-28 DIAGNOSIS — G478 Other sleep disorders: Secondary | ICD-10-CM | POA: Insufficient documentation

## 2020-08-28 DIAGNOSIS — I480 Paroxysmal atrial fibrillation: Secondary | ICD-10-CM | POA: Diagnosis not present

## 2020-08-28 DIAGNOSIS — G4733 Obstructive sleep apnea (adult) (pediatric): Secondary | ICD-10-CM | POA: Insufficient documentation

## 2020-08-28 DIAGNOSIS — Z79899 Other long term (current) drug therapy: Secondary | ICD-10-CM | POA: Diagnosis not present

## 2020-08-28 DIAGNOSIS — R5383 Other fatigue: Secondary | ICD-10-CM | POA: Insufficient documentation

## 2020-08-31 ENCOUNTER — Encounter (HOSPITAL_BASED_OUTPATIENT_CLINIC_OR_DEPARTMENT_OTHER): Payer: Self-pay | Admitting: Cardiovascular Disease

## 2020-08-31 NOTE — Procedures (Signed)
     Patient Name: Madison Nunez, Star Date: 08/28/2020 Gender: Female D.O.B: [birth year] Age (years): 89 Referring Provider: Nadean Corwin Hilty Height (inches): 65 Interpreting Physician: Shelva Majestic MD, ABSM Weight (lbs): 162 RPSGT: Jacolyn Reedy BMI: 27 MRN: [id number] Neck Size: 13.50  CLINICAL INFORMATION Sleep Study Type: HST  Indication for sleep study: fatigue, PAF, non-restorative sleep  Epworth Sleepiness Score: 7  SLEEP STUDY TECHNIQUE A multi-channel overnight portable sleep study was performed. The channels recorded were: nasal airflow, thoracic respiratory movement, and oxygen saturation with a pulse oximetry. Snoring was also monitored.  MEDICATIONS apixaban (ELIQUIS) 5 MG TABS tablet ascorbic Acid (VITAMIN C) 500 MG CPCR B Complex Vitamins (VITAMIN B COMPLEX) TABS CEQUA 0.09 % SOLN Cholecalciferol (VITAMIN D3) 50 MCG (2000 UT) TABS diltiazem (CARDIZEM) 30 MG tablet Flaxseed Oil OIL latanoprost (XALATAN) 0.005 % ophthalmic solution levothyroxine (SYNTHROID, LEVOTHROID) 50 MCG tablet lisinopril (PRINIVIL,ZESTRIL) 40 MG tablet Multiple Vitamins-Minerals (CENTRUM SILVER 50+WOMEN) TABS Patient self administered medications include: N/A.  SLEEP ARCHITECTURE Patient was studied for 442.2 minutes. The sleep efficiency was 100.0 % and the patient was supine for 97.5%. The arousal index was 0.0 per hour.  RESPIRATORY PARAMETERS The overall AHI was 17.6 per hour, with a central apnea index of 0 per hour.   The oxygen nadir was 81% during sleep. Time spent < 89% was 12.1 minutes.  CARDIAC DATA Mean heart rate during sleep was 58.9 bpm.  IMPRESSIONS - Moderate obstructive sleep apnea occurred during this study (AHI  17.6/h). The severity during REM sleep cannot be assessed on this home study. - Moderate oxygen desaturation was noted during this study (Min O2 81%). - No snoring was audible during this study.  DIAGNOSIS - Obstructive Sleep Apnea  (G47.33) - Nocturnal Hypoxemia (G47.36)  RECOMMENDATIONS - Recommend a CPAP titation study to determine optimal treatment of her sleep disordered breathing. If unable to schedule an in-lab titration, initate Auto PAP with EPR of 3  at 6 - 18 cm of water. - Effort should be made to optimize nasal and oropharyngeal patency.  - Avoid alcohol, sedatives and other CNS depressants that may worsen sleep apnea and disrupt normal sleep architecture. - Sleep hygiene should be reviewed to assess factors that may improve sleep quality. - Weight management and regular exercise should be initiated or continued. - Recommend a download in 30 days and sleep clinic evaluation after one month of therapy.   [Electronically signed] 08/31/2020 09:55 PM  Shelva Majestic MD, [facility], [place], American Board of Sleep Medicine   NPI: [id number] [place] PH: [phone]   FX: [phone] [place]

## 2020-09-11 ENCOUNTER — Telehealth: Payer: Self-pay | Admitting: *Deleted

## 2020-09-11 ENCOUNTER — Other Ambulatory Visit: Payer: Self-pay | Admitting: Cardiovascular Disease

## 2020-09-11 DIAGNOSIS — G4733 Obstructive sleep apnea (adult) (pediatric): Secondary | ICD-10-CM

## 2020-09-11 DIAGNOSIS — G4736 Sleep related hypoventilation in conditions classified elsewhere: Secondary | ICD-10-CM

## 2020-09-11 NOTE — Telephone Encounter (Signed)
Patient notified of HST results and recommendations. She agrees to proceed with the CPAP titration that has been scheduled for 11/13/20. Patient is aware of the appointment.

## 2020-09-11 NOTE — Telephone Encounter (Signed)
-----   Message from Troy Sine, MD sent at 08/31/2020 10:02 PM EDT ----- Mariann Laster, please notify pt and try CPAP titration study, if unable to schedule  then AUto-PAP

## 2020-09-19 ENCOUNTER — Ambulatory Visit (HOSPITAL_BASED_OUTPATIENT_CLINIC_OR_DEPARTMENT_OTHER): Payer: Medicare Other | Attending: Cardiovascular Disease | Admitting: Cardiovascular Disease

## 2020-09-19 ENCOUNTER — Other Ambulatory Visit: Payer: Self-pay

## 2020-09-19 DIAGNOSIS — G4736 Sleep related hypoventilation in conditions classified elsewhere: Secondary | ICD-10-CM | POA: Diagnosis not present

## 2020-09-19 DIAGNOSIS — G4733 Obstructive sleep apnea (adult) (pediatric): Secondary | ICD-10-CM | POA: Diagnosis not present

## 2020-10-03 ENCOUNTER — Encounter (HOSPITAL_BASED_OUTPATIENT_CLINIC_OR_DEPARTMENT_OTHER): Payer: Self-pay | Admitting: Cardiovascular Disease

## 2020-10-03 NOTE — Procedures (Signed)
Patient Name: Madison Nunez, Madison Nunez Date: 09/19/2020 Gender: Female D.O.B: August 04, 1945 Age (years): 10 Referring Provider: Nadean Corwin Hilty Height (inches): 64 Interpreting Physician: Shelva Majestic MD, ABSM Weight (lbs): 162 RPSGT: Laren Everts BMI: 28 MRN: [id number] Neck Size: 13.75  CLINICAL INFORMATION The patient is referred for a CPAP titration to treat sleep apnea.  Date of HST: 08/28/2020:  AHI 17.6/h; O2 nadir 81%.  SLEEP STUDY TECHNIQUE As per the AASM Manual for the Scoring of Sleep and Associated Events v2.3 (April 2016) with a hypopnea requiring 4% desaturations.  The channels recorded and monitored were frontal, central and occipital EEG, electrooculogram (EOG), submentalis EMG (chin), nasal and oral airflow, thoracic and abdominal wall motion, anterior tibialis EMG, snore microphone, electrocardiogram, and pulse oximetry. Continuous positive airway pressure (CPAP) was initiated at the beginning of the study and titrated to treat sleep-disordered breathing.  MEDICATIONS apixaban (ELIQUIS) 5 MG TABS tablet ascorbic Acid (VITAMIN C) 500 MG CPCR B Complex Vitamins (VITAMIN B COMPLEX) TABS CEQUA 0.09 % SOLN Cholecalciferol (VITAMIN D3) 50 MCG (2000 UT) TABS diltiazem (CARDIZEM) 30 MG tablet Flaxseed Oil OIL latanoprost (XALATAN) 0.005 % ophthalmic solution levothyroxine (SYNTHROID, LEVOTHROID) 50 MCG tablet lisinopril (PRINIVIL,ZESTRIL) 40 MG tablet Multiple Vitamins-Minerals (CENTRUM SILVER 50+WOMEN) TABS TURMERIC PO Medications self-administered by patient taken the night of the study : N/A  TECHNICIAN COMMENTS Comments added by technician: Patient had difficulty initiating sleep. Patient was restless all through the night. Patient could not tolerate CPAP Comments added by scorer: N/A  RESPIRATORY PARAMETERS Optimal PAP Pressure (cm): 14 AHI at Optimal Pressure (/hr): 9.6 Overall Minimal O2 (%): 84.0 Supine % at Optimal Pressure (%): 100 Minimal O2  at Optimal Pressure (%): 92.0   SLEEP ARCHITECTURE The study was initiated at 9:45:30 PM and ended at 4:30:49 AM.  Sleep onset time was 29.7 minutes and the sleep efficiency was 25.2%%. The total sleep time was 102 minutes.  The patient spent 14.2%% of the night in stage N1 sleep, 85.8%% in stage N2 sleep, 0.0%% in stage N3 and 0% in REM.Stage REM latency was N/A minutes  Wake after sleep onset was 273.7. Alpha intrusion was absent. Supine sleep was 100.00%.  CARDIAC DATA The 2 lead EKG demonstrated sinus rhythm. The mean heart rate was 64.6 beats per minute. Other EKG findings include: PVCs.  LEG MOVEMENT DATA The total Periodic Limb Movements of Sleep (PLMS) were 0. The PLMS index was 0.0. A PLMS index of <15 is considered normal in adults.  IMPRESSIONS - CPAP was initiated at 5 cm and was titrated to 14 cm of water. REM sleep was not achieved during the study.  At 14 cm AHI was 9.6/h, RDI 48.1/h, O2 nadir 92%. - Central sleep apnea was not noted during this titration (CAI = 1.8/h). - Moderate oxygen desaturations to a nadir of 84.0% at 6 cm of water. - No snoring was audible during this study with minimal noted on snoring sensor. - 2-lead EKG demonstrated: PVCs - Clinically significant periodic limb movements were not noted during this study. Arousals associated with PLMs were rare.  DIAGNOSIS - Obstructive Sleep Apnea (G47.33)  RECOMMENDATIONS - Recommend an initial trial of CPAP Auto therapy with EPR of 3 at 14 - 20 cm H2O with heated humidification.  A Small size Resmed Full Face Mask AirFit F20 mask was used for the titration. - Effort should be made to optimize nasal and oropharyngeal patency. - Avoid alcohol, sedatives and other CNS depressants that may worsen sleep apnea  and disrupt normal sleep architecture. - Sleep hygiene should be reviewed to assess factors that may improve sleep quality. - Weight management and regular exercise should be initiated or continued. -  Recommend a download in 30 days and sleep clinic evaluation after 4 weeks of therapy.   [Electronically signed] 10/03/2020 02:41 PM  Shelva Majestic MD, [facility], [place], American Board of Sleep Medicine   NPI: [id number] [place] PH: [phone]   FX: [phone] [place]

## 2020-11-13 ENCOUNTER — Other Ambulatory Visit: Payer: Self-pay | Admitting: General Practice

## 2020-11-13 ENCOUNTER — Encounter (HOSPITAL_BASED_OUTPATIENT_CLINIC_OR_DEPARTMENT_OTHER): Payer: Medicare Other | Admitting: Cardiovascular Disease

## 2020-11-14 NOTE — Telephone Encounter (Signed)
Prescription refill request for Eliquis received. Indication: Afib   Last office visit: 07/18/20 (Hilty)   Scr: 0.69 (04/15/20) Age: 75 Weight: 73.5kg  Appropriate dose and refill sent to requested pharmacy.

## 2021-02-27 ENCOUNTER — Ambulatory Visit (HOSPITAL_COMMUNITY)
Admission: RE | Admit: 2021-02-27 | Discharge: 2021-02-27 | Disposition: A | Discharge status: Home / Self Care | Payer: Medicare Other | Source: Ambulatory Visit | Attending: Physician Assistant | Admitting: Physician Assistant

## 2021-02-27 ENCOUNTER — Encounter (HOSPITAL_COMMUNITY): Payer: Self-pay | Admitting: Physician Assistant

## 2021-02-27 ENCOUNTER — Other Ambulatory Visit: Payer: Self-pay

## 2021-02-27 VITALS — BP 138/80 | HR 70 | Ht 64.0 in | Wt 147.6 lb

## 2021-02-27 DIAGNOSIS — I10 Essential (primary) hypertension: Secondary | ICD-10-CM | POA: Insufficient documentation

## 2021-02-27 DIAGNOSIS — D6869 Other thrombophilia: Secondary | ICD-10-CM | POA: Diagnosis not present

## 2021-02-27 DIAGNOSIS — C50919 Malignant neoplasm of unspecified site of unspecified female breast: Secondary | ICD-10-CM | POA: Insufficient documentation

## 2021-02-27 DIAGNOSIS — I480 Paroxysmal atrial fibrillation: Secondary | ICD-10-CM | POA: Insufficient documentation

## 2021-02-27 DIAGNOSIS — Z7901 Long term (current) use of anticoagulants: Secondary | ICD-10-CM | POA: Diagnosis not present

## 2021-02-27 DIAGNOSIS — E039 Hypothyroidism, unspecified: Secondary | ICD-10-CM | POA: Diagnosis not present

## 2021-02-27 DIAGNOSIS — G4733 Obstructive sleep apnea (adult) (pediatric): Secondary | ICD-10-CM | POA: Insufficient documentation

## 2021-02-27 NOTE — Progress Notes (Signed)
Primary Care Physician: Bernerd Limbo, MD Primary Cardiologist: Dr Debara Pickett Primary Electrophysiologist: none Referring Physician: Dr Delton Coombes is a 75 y.o. female with a history of HTN, OSA, hypothyroidism, breast cancer, and atrial fibrillation who presents for follow up in the [facility]. The patient was initially diagnosed with atrial fibrillation 04/15/20 after presenting to the ED with symptoms of palpitations. ECG showed afib with RVR. She converted back to SR in the ED. Patient is on Eliquis for a CHADS2VASC score of 3. She does report that she has had palpitations for years but had never been diagnosed with afib. Patient did not tolerate daily diltiazem due to bradycardia and dizziness. She was started on PRN diltiazem.   On follow up today, patient reports she has not had any palpitations since her last visit. She denies any bleeding issues on anticoagulation.    Today, she denies symptoms of palpitations, chest pain, shortness of breath, orthopnea, PND, lower extremity edema, presyncope, syncope, snoring, daytime somnolence, bleeding, or neurologic sequela. The patient is tolerating medications without difficulties and is otherwise without complaint today.    Atrial Fibrillation Risk Factors:  she does have symptoms or diagnosis of sleep apnea. She is waiting for her CPAP. she does not have a history of rheumatic fever. she does have a history of alcohol use. The patient does not have a history of early familial atrial fibrillation or other arrhythmias.  she has a BMI of Body mass index is 25.34 kg/m.Marland Kitchen Filed Weights   02/27/21 0927  Weight: 67 kg      Family History  Problem Relation Age of Onset   Coronary artery disease Brother 44       died of first MI at 40   Coronary artery disease Father        had juvenile DM most of his life, had 3 MI's the first in his early 10's, died at 4   Diabetes Father    Breast cancer Maternal  Grandmother      Atrial Fibrillation Management history:  Previous antiarrhythmic drugs: none Previous cardioversions: none Previous ablations: none CHADS2VASC score: 3 Anticoagulation history: Eliquis   Past Medical History:  Diagnosis Date   Arthritis    hands and lower back   Atrial fibrillation [facility])    Per patient   Breast cancer ([place]) 2005   right lumpectomy, chemotherapy with A/C combo  & Taxol, mammogram 02/06/11 without recurrence   Dyspnea    Echocardiogram 02/20/11: EF 60-65%.;  ETT-myoveiw 12/12 normal with EF 80%   Hypertension    Hypothyroidism    Personal history of chemotherapy 10/2003   Personal history of radiation therapy 03/2004   Past Surgical History:  Procedure Laterality Date   APPENDECTOMY  1955   BREAST BIOPSY  08/29/2003   malignant   BREAST LUMPECTOMY  2005   right   TONSILLECTOMY     "as a child"    Current Outpatient Medications  Medication Sig Dispense Refill   ascorbic Acid (VITAMIN C) 500 MG CPCR Take 500 mg by mouth daily.     B Complex Vitamins (VITAMIN B COMPLEX) TABS Take 0.5-1 tablets by mouth in the morning.     Cholecalciferol (VITAMIN D3) 50 MCG (2000 UT) TABS Take 2,000 Units by mouth daily.     diltiazem (CARDIZEM) 30 MG tablet Take 1 tablet every 4 hours as needed for heart rates over 100 15 tablet 1   ELIQUIS 5 MG TABS tablet TAKE  1 TABLET(5 MG) BY MOUTH TWICE DAILY 60 tablet 6   Flaxseed Oil OIL Take 1,300 mg by mouth in the morning and at bedtime.     latanoprost (XALATAN) 0.005 % ophthalmic solution Place 1 drop into both eyes at bedtime.     levothyroxine (SYNTHROID, LEVOTHROID) 50 MCG tablet Take 50 mcg by mouth daily.     lisinopril (PRINIVIL,ZESTRIL) 40 MG tablet Take 1 tablet (40 mg total) by mouth daily. 30 tablet 0   Multiple Vitamins-Minerals (CENTRUM SILVER 50+WOMEN) TABS Take 1 tablet by mouth daily with breakfast.     No current facility-administered medications for this encounter.    Allergies  Allergen  Reactions   Hctz [Hydrochlorothiazide] Other (See Comments)    Hyponatremia (leading to hospitalization)   Beta Adrenergic Blockers Nausea Only and Other (See Comments)    Metoprolol and Carvedilol have stopped working for patient over time, causing nausea also   Prednisone Other (See Comments)    Had reaction with use prior to chemo- ONLY IN HIGHER DOSES    Social History   Socioeconomic History   Marital status: Divorced    Spouse name: Not on file   Number of children: Not on file   Years of education: Not on file   Highest education level: Not on file  Occupational History   Not on file  Tobacco Use   Smoking status: Former    Packs/day: 1.00    Years: 5.00    Pack years: 5.00    Types: Cigarettes   Smokeless tobacco: Never   Tobacco comments:    Former smoker 02/27/21  Substance and Sexual Activity   Alcohol use: Not Currently    Alcohol/week: 7.0 standard drinks    Types: 7 Glasses of wine per week   Drug use: No   Sexual activity: Never  Other Topics Concern   Not on file  Social History Narrative   Lives alone.  Drives.     Social Determinants of Health   Financial Resource Strain: Not on file  Food Insecurity: Not on file  Transportation Needs: Not on file  Physical Activity: Not on file  Stress: Not on file  Social Connections: Not on file  Intimate Partner Violence: Not on file     ROS- All systems are reviewed and negative except as per the HPI above.  Physical Exam: Vitals:   02/27/21 0927  BP: 138/80  Pulse: 70  Weight: 67 kg  Height: 5\' 4"  (1.626 m)    GEN- The patient is a well appearing elderly female, alert and oriented x 3 today.   HEENT-head normocephalic, atraumatic, sclera clear, conjunctiva pink, hearing intact, trachea midline. Lungs- Clear to ausculation bilaterally, normal work of breathing Heart- Regular rate and rhythm, no murmurs, rubs or gallops  GI- soft, NT, ND, + BS Extremities- no clubbing, cyanosis, or edema MS- no  significant deformity or atrophy Skin- no rash or lesion Psych- euthymic mood, full affect Neuro- strength and sensation are intact   Wt Readings from Last 3 Encounters:  02/27/21 67 kg  09/19/20 73.5 kg  08/28/20 73.5 kg    EKG today demonstrates  SR, inc RBBB, LAFB Vent. rate 70 BPM PR interval 156 ms QRS duration 104 ms QT/QTcB 402/434 ms  Echo 05/23/20 demonstrated  1. Left ventricular ejection fraction, by estimation, is 55 to 60%. The  left ventricle has normal function. The left ventricle has no regional  wall motion abnormalities. There is mild left ventricular hypertrophy of  the basal-septal  segment. Left  ventricular diastolic parameters are consistent with Grade I diastolic  dysfunction (impaired relaxation).   2. Right ventricular systolic function is normal. The right ventricular  size is normal. There is normal pulmonary artery systolic pressure.   3. Left atrial size was mildly dilated.   4. The mitral valve is normal in structure. Trivial mitral valve  regurgitation. No evidence of mitral stenosis.   5. The aortic valve is tricuspid. Aortic valve regurgitation is not  visualized. No aortic stenosis is present.   6. The inferior vena cava is normal in size with greater than 50%  respiratory variability, suggesting right atrial pressure of 3 mmHg.   Epic records are reviewed at length today  CHA2DS2-VASc Score = 3  The patient's score is based upon: CHF History: 0 HTN History: 1 Diabetes History: 0 Stroke History: 0 Vascular Disease History: 0 Age Score: 1 Gender Score: 1       ASSESSMENT AND PLAN: 1. Paroxysmal Atrial Fibrillation (ICD10:  I48.0) The patient's CHA2DS2-VASc score is 3, indicating a 3.2% annual risk of stroke.   Patient appears to be maintaining SR.  Would avoid class IC with inc RBBB and LAFB. Could consider Multaq, dofetilide, or ablation if her afib becomes more frequent/persistent. Continue Eliquis 5 mg BID Continue diltiazem  30 mg PRN q 4 hours for heart racing.   2. Secondary Hypercoagulable State (ICD10:  D68.69) The patient is at significant risk for stroke/thromboembolism based upon her CHA2DS2-VASc Score of 3.  Continue Apixaban (Eliquis).   3. HTN Stable, no changes today.  4. OSA Patient waiting on CPAP to arrive.    Follow up in the AF clinic in 6 months.    [facility] [address] [place], [place] [address] [phone] 02/27/2021 9:52 AM

## 2021-02-28 ENCOUNTER — Other Ambulatory Visit: Payer: Self-pay

## 2021-02-28 ENCOUNTER — Emergency Department (HOSPITAL_COMMUNITY)
Admission: EM | Admit: 2021-02-28 | Discharge: 2021-03-01 | Disposition: A | Discharge status: Home / Self Care | Payer: Medicare Other | Attending: Emergency Medicine | Admitting: Emergency Medicine

## 2021-02-28 ENCOUNTER — Encounter (HOSPITAL_COMMUNITY): Payer: Self-pay | Admitting: Emergency Medicine

## 2021-02-28 ENCOUNTER — Emergency Department (HOSPITAL_COMMUNITY): Payer: Medicare Other

## 2021-02-28 DIAGNOSIS — Z79899 Other long term (current) drug therapy: Secondary | ICD-10-CM | POA: Insufficient documentation

## 2021-02-28 DIAGNOSIS — Z87891 Personal history of nicotine dependence: Secondary | ICD-10-CM | POA: Diagnosis not present

## 2021-02-28 DIAGNOSIS — R1033 Periumbilical pain: Secondary | ICD-10-CM | POA: Diagnosis not present

## 2021-02-28 DIAGNOSIS — R110 Nausea: Secondary | ICD-10-CM | POA: Diagnosis not present

## 2021-02-28 DIAGNOSIS — R0602 Shortness of breath: Secondary | ICD-10-CM | POA: Diagnosis not present

## 2021-02-28 DIAGNOSIS — R42 Dizziness and giddiness: Secondary | ICD-10-CM | POA: Insufficient documentation

## 2021-02-28 DIAGNOSIS — E039 Hypothyroidism, unspecified: Secondary | ICD-10-CM | POA: Insufficient documentation

## 2021-02-28 DIAGNOSIS — I10 Essential (primary) hypertension: Secondary | ICD-10-CM | POA: Diagnosis not present

## 2021-02-28 DIAGNOSIS — M542 Cervicalgia: Secondary | ICD-10-CM | POA: Insufficient documentation

## 2021-02-28 DIAGNOSIS — Z853 Personal history of malignant neoplasm of breast: Secondary | ICD-10-CM | POA: Diagnosis not present

## 2021-02-28 DIAGNOSIS — I480 Paroxysmal atrial fibrillation: Secondary | ICD-10-CM | POA: Insufficient documentation

## 2021-02-28 DIAGNOSIS — Z7901 Long term (current) use of anticoagulants: Secondary | ICD-10-CM | POA: Diagnosis not present

## 2021-02-28 LAB — COMPREHENSIVE METABOLIC PANEL
ALT: 25 U/L (ref 0–44)
AST: 24 U/L (ref 15–41)
Albumin: 4 g/dL (ref 3.5–5.0)
Alkaline Phosphatase: 70 U/L (ref 38–126)
Anion gap: 8 (ref 5–15)
BUN: 6 mg/dL — ABNORMAL LOW (ref 8–23)
CO2: 26 mmol/L (ref 22–32)
Calcium: 9.5 mg/dL (ref 8.9–10.3)
Chloride: 100 mmol/L (ref 98–111)
Creatinine, Ser: 0.56 mg/dL (ref 0.44–1.00)
GFR, Estimated: >60 mL/min (ref >60)
Glucose, Bld: 99 mg/dL (ref 70–99)
Potassium: 3.9 mmol/L (ref 3.5–5.1)
Sodium: 134 mmol/L — ABNORMAL LOW (ref 135–145)
Total Bilirubin: 0.8 mg/dL (ref 0.3–1.2)
Total Protein: 7.5 g/dL (ref 6.5–8.1)

## 2021-02-28 LAB — URINALYSIS, ROUTINE W REFLEX MICROSCOPIC
Bacteria, UA: NONE SEEN
Bilirubin Urine: NEGATIVE
Glucose, UA: NEGATIVE mg/dL
Ketones, ur: 5 mg/dL — AB
Leukocytes,Ua: NEGATIVE
Nitrite: NEGATIVE
Protein, ur: NEGATIVE mg/dL
Specific Gravity, Urine: 1.009 (ref 1.005–1.030)
pH: 5 (ref 5.0–8.0)

## 2021-02-28 LAB — CBC
HCT: 44.9 % (ref 36.0–46.0)
Hemoglobin: 14.9 g/dL (ref 12.0–15.0)
MCH: 31.6 pg (ref 26.0–34.0)
MCHC: 33.2 g/dL (ref 30.0–36.0)
MCV: 95.3 fL (ref 80.0–100.0)
Platelets: 248 10*3/uL (ref 150–400)
RBC: 4.71 MIL/uL (ref 3.87–5.11)
RDW: 11.6 % (ref 11.5–15.5)
WBC: 5.6 10*3/uL (ref 4.0–10.5)
nRBC: 0 % (ref 0.0–0.2)

## 2021-02-28 LAB — LIPASE, BLOOD: Lipase: 32 U/L (ref 11–51)

## 2021-02-28 MED ORDER — IOHEXOL 350 MG/ML SOLN
75.0000 mL | Freq: Once | INTRAVENOUS | Status: AC | PRN
  Administered 2021-02-28: 23:00:00 75 mL via INTRAVENOUS

## 2021-02-28 NOTE — ED Triage Notes (Signed)
Pt reports generalized abdominal pain x 2 months. Pt was seen by GI Dr. last week. Pt reports nausea at times. Pt reports bending over makes the pain worse.

## 2021-02-28 NOTE — ED Provider Notes (Signed)
Emergency Medicine Provider Triage Evaluation Note  Madison Nunez , a 75 y.o. female  was evaluated in triage.  Pt complains of abdominal pain x 2 months,with associated nausea, with NBNB emesis. Has seen GI, without clear etiology.  Review of Systems  Positive: Nausea, abdominal pain  Negative: Melena, hematochezia  Physical Exam  BP (!) 150/92 (BP Location: Right Arm)    Pulse 96    Temp 98.2 F (36.8 C)    Resp 14    LMP 02/19/2011    SpO2 100%  Gen:   Awake, no distress   Resp:  Normal effort  MSK:   Moves extremities without difficulty  Other:  TTP in the LUQ and epigastrium, RRR no m/r/g. Lungs CTAB  Medical Decision Making  Medically screening exam initiated at 8:33 PM.  Appropriate orders placed.  [name] was informed that the remainder of the evaluation will be completed by another provider, this initial triage assessment does not replace that evaluation, and the importance of remaining in the ED until their evaluation is complete.  Work up started.   This chart was dictated using voice recognition software, Dragon. Despite the best efforts of this provider to proofread and correct errors, errors may still occur which can change documentation meaning.    Aura Dials 02/28/21 2039    Jeanell Sparrow, DO 03/01/21 [phone]

## 2021-03-01 ENCOUNTER — Encounter (HOSPITAL_COMMUNITY): Payer: Self-pay | Admitting: Radiology

## 2021-03-01 ENCOUNTER — Emergency Department (HOSPITAL_COMMUNITY): Payer: Medicare Other

## 2021-03-01 LAB — TROPONIN I (HIGH SENSITIVITY)
Troponin I (High Sensitivity): 8 ng/L (ref <18)
Troponin I (High Sensitivity): 8 ng/L (ref <18)

## 2021-03-01 LAB — BRAIN NATRIURETIC PEPTIDE: B Natriuretic Peptide: 19.7 pg/mL (ref 0.0–100.0)

## 2021-03-01 MED ORDER — SODIUM CHLORIDE 0.9 % IV BOLUS
500.0000 mL | Freq: Once | INTRAVENOUS | Status: AC
  Administered 2021-03-01: 08:00:00 500 mL via INTRAVENOUS

## 2021-03-01 MED ORDER — IOHEXOL 350 MG/ML SOLN
75.0000 mL | Freq: Once | INTRAVENOUS | Status: AC | PRN
  Administered 2021-03-01: 11:00:00 75 mL via INTRAVENOUS

## 2021-03-01 NOTE — Discharge Instructions (Addendum)
Today your work-up was reassuring.  The scans on your head and neck did not show any area of significant vascular narrowing that might be causing your symptoms.  The skin on your abdomen was reassuring without cause for your symptoms found.  Your blood work was also reassuring.  Please schedule a follow-up appointment with your primary care doctor. If your symptoms worsen, you develop any new or concerning symptoms please seek additional medical care and evaluation.  Please make sure you are drinking plenty water and staying well-hydrated.

## 2021-03-01 NOTE — ED Provider Notes (Signed)
[facility] DEPART[facility] Provider Note   CSN: [id number] Arrival date & time: 02/28/21  1521     History Chief Complaint  Patient presents with   Abdominal Pain    MADICYN MESINA is a 75 y.o. female with a past medical history of OSA, A. fib anticoagulated with Eliquis, hypertension, hypothyroid, breast cancer in remission who presents today for evaluation of abdominal pain and intermittent episodes of dizziness.  She states that in September 1 day while gardening she bent down to pick up a tool and when she stood back up she had immediate onset of sudden severe dizziness.  She reports left-sided neck pain.  She states that she has 2-3 episodes of dizziness where she feels like things are spinning and moving.  Sometimes this is accompanied by periumbilical to left-sided abdominal pain.  She states she has not had any chiropractic adjustments or manipulations.  She has not fallen.  She states that she had her ears checked by ENT without cause for her symptoms found, she has seen a GI doctor again withou\t was for symptoms found. She states that the episodes are becoming more and more frequent which is what prompted her visit to the emergency room.    HPI     Past Medical History:  Diagnosis Date   Arthritis    hands and lower back   Atrial fibrillation [facility])    Per patient   Breast cancer ([place]) 2005   right lumpectomy, chemotherapy with A/C combo  & Taxol, mammogram 02/06/11 without recurrence   Dyspnea    Echocardiogram 02/20/11: EF 60-65%.;  ETT-myoveiw 12/12 normal with EF 80%   Hypertension    Hypothyroidism    Personal history of chemotherapy 10/2003   Personal history of radiation therapy 03/2004    Patient Active Problem List   Diagnosis Date Noted   OSA (obstructive sleep apnea) 09/19/2020   Paroxysmal atrial fibrillation ([place]) 05/01/2020   Secondary hypercoagulable state ([place]) 05/01/2020   Chest pain, rule out acute myocardial infarction  02/03/2017   Palpitations    Hyponatremia 10/13/2012   Acute upper respiratory infections of unspecified site 10/13/2012   Dizziness 03/07/2011   Shortness of breath 02/19/2011   Nausea alone 02/19/2011   Labile hypertension 02/19/2011   Hypothyroidism 02/19/2011    Past Surgical History:  Procedure Laterality Date   APPENDECTOMY  1955   BREAST BIOPSY  08/29/2003   malignant   BREAST LUMPECTOMY  2005   right   TONSILLECTOMY     "as a child"     OB History   No obstetric history on file.     Family History  Problem Relation Age of Onset   Coronary artery disease Brother 26       died of first MI at 22   Coronary artery disease Father        had juvenile DM most of his life, had 3 MI's the first in his early 69's, died at 17   Diabetes Father    Breast cancer Maternal Grandmother     Social History   Tobacco Use   Smoking status: Former    Packs/day: 1.00    Years: 5.00    Pack years: 5.00    Types: Cigarettes   Smokeless tobacco: Never   Tobacco comments:    Former smoker 02/27/21  Substance Use Topics   Alcohol use: Not Currently    Alcohol/week: 7.0 standard drinks    Types: 7 Glasses of wine per week  Drug use: No    Home Medications Prior to Admission medications   Medication Sig Start Date End Date Taking? Authorizing Provider  ascorbic Acid (VITAMIN C) 500 MG CPCR Take 500 mg by mouth daily.    [provider]  B Complex Vitamins (VITAMIN B COMPLEX) TABS Take 0.5-1 tablets by mouth in the morning.    [provider]  Cholecalciferol (VITAMIN D3) 50 MCG (2000 UT) TABS Take 2,000 Units by mouth daily.    [provider]  diltiazem (CARDIZEM) 30 MG tablet Take 1 tablet every 4 hours as needed for heart rates over 100 05/09/20   Fenton, Clint R, PA  ELIQUIS 5 MG TABS tablet TAKE 1 TABLET(5 MG) BY MOUTH TWICE DAILY 11/14/20   Hilty, Nadean Corwin, MD  Flaxseed Oil OIL Take 1,300 mg by mouth in the morning and at bedtime.    [provider]  latanoprost (XALATAN) 0.005 % ophthalmic solution Place 1 drop into both eyes at bedtime. 06/25/19   [provider]  levothyroxine (SYNTHROID, LEVOTHROID) 50 MCG tablet Take 50 mcg by mouth daily.    [provider]  lisinopril (PRINIVIL,ZESTRIL) 40 MG tablet Take 1 tablet (40 mg total) by mouth daily. 10/14/12   Janece Canterbury, MD  Multiple Vitamins-Minerals (CENTRUM SILVER 50+WOMEN) TABS Take 1 tablet by mouth daily with breakfast.    [provider]    Allergies    Hctz [hydrochlorothiazide], Beta adrenergic blockers, and Prednisone  Review of Systems   Review of Systems  Constitutional:  Negative for chills and fever.  Respiratory:  Positive for shortness of breath (During episodes feels like she can't catch her breath). Negative for cough.   Cardiovascular:  Negative for chest pain.  Gastrointestinal:  Positive for abdominal pain and nausea. Negative for constipation and vomiting.  Musculoskeletal:  Positive for neck pain. Negative for back pain.  Neurological:  Positive for dizziness. Negative for headaches.  Psychiatric/Behavioral:  Negative for confusion.   All other systems reviewed and are negative.  Physical Exam Updated Vital Signs BP 125/74    Pulse 68    Temp 98.8 F (37.1 C) (Oral)    Resp 17    LMP 02/19/2011    SpO2 98%   Physical Exam Vitals and nursing note reviewed.  Constitutional:      General: She is not in acute distress.    Appearance: She is not ill-appearing.  HENT:     Head: Normocephalic and atraumatic.  Eyes:     Extraocular Movements: Extraocular movements intact.     Conjunctiva/sclera: Conjunctivae normal.     Pupils: Pupils are equal, round, and reactive to light.  Cardiovascular:     Rate and Rhythm: Normal rate.     Heart sounds: Normal heart sounds.  Pulmonary:     Effort: Pulmonary effort is normal. No respiratory distress.     Breath sounds: Normal breath sounds. No stridor.  Abdominal:      General: There is no distension.     Palpations: Abdomen is soft.     Tenderness: There is no abdominal tenderness.  Genitourinary:    Comments: deferred Musculoskeletal:     Cervical back: Normal range of motion and neck supple.     Comments: No obvious acute injury  Skin:    General: Skin is warm.  Neurological:     Mental Status: She is alert.     Comments: Awake and alert, answers all questions appropriately.  Speech is not slurred. 5/5 strength bilateral arms  and legs.  Normal finger-nose-finger bilaterally. Patient symptoms are frequently triggered by bending over and then standing up, I had her attempt this multiple times in am unable to recreate her events.    Psychiatric:        Mood and Affect: Mood normal.        Behavior: Behavior normal.    ED Results / Procedures / Treatments   Labs (all labs ordered are listed, but only abnormal results are displayed) Labs Reviewed  COMPREHENSIVE METABOLIC PANEL - Abnormal; Notable for the following components:      Result Value   Sodium 134 (*)    BUN 6 (*)    All other components within normal limits  URINALYSIS, ROUTINE W REFLEX MICROSCOPIC - Abnormal; Notable for the following components:   Hgb urine dipstick MODERATE (*)    Ketones, ur 5 (*)    All other components within normal limits  LIPASE, BLOOD  CBC  BRAIN NATRIURETIC PEPTIDE  TROPONIN I (HIGH SENSITIVITY)  TROPONIN I (HIGH SENSITIVITY)    EKG None  Radiology CT ANGIO HEAD NECK W WO CM  Result Date: 03/01/2021 CLINICAL DATA:  Sudden onset dizziness and left-sided neck pain, vertebral artery dissection suspected EXAM: CT ANGIOGRAPHY HEAD AND NECK TECHNIQUE: Multidetector CT imaging of the head and neck was performed using the standard protocol during bolus administration of intravenous contrast. Multiplanar CT image reconstructions and MIPs were obtained to evaluate the vascular anatomy. Carotid stenosis measurements (when applicable) are obtained utilizing  NASCET criteria, using the distal internal carotid diameter as the denominator. CONTRAST:  60mL OMNIPAQUE IOHEXOL 350 MG/ML SOLN COMPARISON:  CT head 10/13/2012 FINDINGS: CT HEAD FINDINGS Brain: No evidence of acute infarction, hemorrhage, cerebral edema, mass, mass effect, or midline shift. No hydrocephalus or extra-axial fluid collection. Mildly prominent ventricles for age. Periventricular white matter changes, likely the sequela of chronic small vessel ischemic disease. Vascular: No hyperdense vessel. Skull: Normal. Negative for fracture or focal lesion. Sinuses/Orbits: No acute finding. Status post bilateral lens replacements. Other: The mastoid air cells are well aerated. CTA NECK FINDINGS Aortic arch: Standard branching. Imaged portion shows no evidence of aneurysm or dissection. No significant stenosis of the major arch vessel origins. Aortic atherosclerosis with additional atherosclerotic disease seen at the origin of the left subclavian and in the proximal brachiocephalic artery, which are not hemodynamically significant. Right carotid system: No evidence of dissection, stenosis (50% or greater) or occlusion. Left carotid system: No evidence of dissection, stenosis (50% or greater) or occlusion. Calcified plaque at the bifurcation, which is not significant. Vertebral arteries: Right dominant. No evidence of dissection, stenosis (50% or greater) or occlusion. Skeleton: Degenerative changes in the cervical spine, with multilevel disc height loss, facet arthropathy, and uncovertebral hypertrophy. Degenerative fusion of the anterior arch of C1 with the dens. No acute osseous abnormality. Other neck: Negative Upper chest: Focal pulmonary opacity or pleural effusion. Review of the MIP images confirms the above findings CTA HEAD FINDINGS Anterior circulation: Both internal carotid arteries are patent to the termini, without stenosis or other abnormality. A1 segments patent. Normal anterior communicating artery.  Azygous ACA. Anterior cerebral arteries are patent to their distal aspects. No M1 stenosis or occlusion. Normal MCA bifurcations. Distal MCA branches perfused and symmetric. Posterior circulation: Vertebral arteries patent to the vertebrobasilar junction without stenosis. The left V4 is somewhat diminutive both before and distal to the PICA takeoff, likely a normal variant. Posterior inferior cerebral arteries patent bilaterally. Basilar patent to its distal aspect. Superior cerebellar arteries  patent bilaterally. Bilateral P1 segments originate from the basilar artery. Bilateral posterior communicating arteries are patent. PCAs perfused to their distal aspects without stenosis. Venous sinuses: As permitted by contrast timing, patent. Anatomic variants: None significant Review of the MIP images confirms the above findings IMPRESSION: 1.  No acute intracranial process. 2. No intracranial large vessel occlusion or significant stenosis. 3. No hemodynamically significant stenosis in the neck. 4. Advanced degenerative changes in the cervical spine. Consider nonemergent cervical spine MRI if further evaluation is clinically indicated. Electronically Signed   By: Merilyn Baba M.D.   On: 03/01/2021 11:50   CT Abdomen Pelvis W Contrast  Result Date: 02/28/2021 CLINICAL DATA:  Generalized abdominal pain for 2 months, intermittent nausea, left upper quadrant pain today EXAM: CT ABDOMEN AND PELVIS WITH CONTRAST TECHNIQUE: Multidetector CT imaging of the abdomen and pelvis was performed using the standard protocol following bolus administration of intravenous contrast. CONTRAST:  69mL OMNIPAQUE IOHEXOL 350 MG/ML SOLN COMPARISON:  None. FINDINGS: Lower chest: No acute pleural or parenchymal lung disease. Hepatobiliary: No focal liver abnormality is seen. No gallstones, gallbladder wall thickening, or biliary dilatation. Pancreas: Unremarkable. No pancreatic ductal dilatation or surrounding inflammatory changes. Spleen:  Normal in size without focal abnormality. Adrenals/Urinary Tract: Mild bilateral adrenal thickening. Kidneys enhance normally and symmetrically. No urinary tract calculi or obstructive uropathy. Bladder is minimally distended, without focal abnormality. Stomach/Bowel: No bowel obstruction or ileus. The appendix is surgically absent. No bowel wall thickening or inflammatory change. Vascular/Lymphatic: Aortic atherosclerosis. No enlarged abdominal or pelvic lymph nodes. Reproductive: Uterus and bilateral adnexa are unremarkable. Other: No free fluid or free intraperitoneal gas. No abdominal wall hernia. Musculoskeletal: No acute or destructive bony lesions. Reconstructed images demonstrate no additional findings. IMPRESSION: 1. No acute intra-abdominal or intrapelvic process. 2. Mild nonspecific bilateral adrenal thickening, statistically likely hyperplasia or adenomas. 3.  Aortic Atherosclerosis (ICD10-I70.0). Electronically Signed   By: Randa Ngo M.D.   On: 02/28/2021 23:02    Procedures Procedures   Medications Ordered in ED Medications  iohexol (OMNIPAQUE) 350 MG/ML injection 75 mL (75 mLs Intravenous Contrast Given 02/28/21 2237)  sodium chloride 0.9 % bolus 500 mL (0 mLs Intravenous Stopped 03/01/21 0835)  iohexol (OMNIPAQUE) 350 MG/ML injection 75 mL (75 mLs Intravenous Contrast Given 03/01/21 1113)    ED Course  I have reviewed the triage vital signs and the nursing notes.  Pertinent labs & imaging results that were available during my care of the patient were reviewed by me and considered in my medical decision making (see chart for details).    MDM Rules/Calculators/A&P                          Patient is a 75 year old woman who presents today for evaluation of episodes of dizziness and nausea.  CBC, lipase, CMP are all unremarkable.  UA does show moderate blood, she is not having any urinary symptoms.  BNP is not elevated, troponin is not elevated x2. CT abdomen pelvis was  obtained given the nausea and subjective abdominal discomfort that occasionally follows these episodes without cause for symptoms found, I attempted to have patient induce 1 of these episodes without success. She is given IV fluids. Given the positional component of this and that her symptoms started after she was bending down and stood up suddenly with dizziness and feeling unwell I am concerned for a stenosis or vascular issue contributing to her symptoms.  CTA head and neck is obtained  without evidence of intracranial artifactual occlusion or significant stenosis or hemodynamically significant stenosis in the neck.  She does have advanced degenerative changes of the C-spine. Incidental findings were discussed with the patient.  She stated that she felt well and wished to go home. She is given neurology follow-up.  Return precautions were discussed with patient who states their understanding.  At the time of discharge patient denied any unaddressed complaints or concerns.  Patient is agreeable for discharge home.  Note: Portions of this report may have been transcribed using voice recognition software. Every effort was made to ensure accuracy; however, inadvertent computerized transcription errors may be present    Final Clinical Impression(s) / ED Diagnoses Final diagnoses:  Dizziness    Rx / DC Orders ED Discharge Orders     None        Lorin Glass, PA-C 08/27/92 [id number]    Gray, [place], DO 27/03/50 [id number]

## 2021-03-01 NOTE — ED Notes (Signed)
Pt verbalized understanding of d/c instructions, meds, and followup care. Denies questions. VSS, no distress noted. Steady gait to exit with all belongings.  ?

## 2021-06-11 ENCOUNTER — Other Ambulatory Visit: Payer: Self-pay | Admitting: Internal Medicine

## 2021-06-11 NOTE — Telephone Encounter (Signed)
Prescription refill request for Eliquis received. ?Indication:Afib ?Last office visit:12/22 ?Scr:0.5 ?Age: 76 ?Weight:67 kg ? ?Prescription refilled ? ?

## 2021-07-16 ENCOUNTER — Telehealth: Payer: Self-pay | Admitting: Cardiology

## 2021-07-16 NOTE — Telephone Encounter (Signed)
Mas. Shuping called because she took 2 of the eliquis 5 mg instead of one.  She thought one of the pills was lisinopril - I reassured her and asked her to monitor for any bleeding but to call the office in the morning to let us know how she did overnight before she takes her morning dose.  She is agreeable.  ? ?Pharmacy could you check on her tomorrow before she takes her AM dose. Thanks. Mickel Baas  ?

## 2021-07-17 NOTE — Telephone Encounter (Signed)
Called pt, she's doing fine. Denies bleeding/bruising. Aware to continue with Eliquis BID dosing. ?

## 2021-08-29 ENCOUNTER — Encounter (HOSPITAL_COMMUNITY): Payer: Self-pay | Admitting: Physician Assistant

## 2021-08-29 ENCOUNTER — Ambulatory Visit (HOSPITAL_COMMUNITY)
Admission: RE | Admit: 2021-08-29 | Discharge: 2021-08-29 | Disposition: A | Discharge status: Home / Self Care | Payer: Medicare Other | Source: Ambulatory Visit | Attending: Physician Assistant | Admitting: Physician Assistant

## 2021-08-29 VITALS — BP 124/74 | HR 69 | Ht 64.0 in | Wt 154.6 lb

## 2021-08-29 DIAGNOSIS — Z7901 Long term (current) use of anticoagulants: Secondary | ICD-10-CM | POA: Diagnosis not present

## 2021-08-29 DIAGNOSIS — D6869 Other thrombophilia: Secondary | ICD-10-CM

## 2021-08-29 DIAGNOSIS — G4733 Obstructive sleep apnea (adult) (pediatric): Secondary | ICD-10-CM | POA: Insufficient documentation

## 2021-08-29 DIAGNOSIS — Z79899 Other long term (current) drug therapy: Secondary | ICD-10-CM | POA: Insufficient documentation

## 2021-08-29 DIAGNOSIS — Z853 Personal history of malignant neoplasm of breast: Secondary | ICD-10-CM | POA: Diagnosis not present

## 2021-08-29 DIAGNOSIS — I10 Essential (primary) hypertension: Secondary | ICD-10-CM | POA: Diagnosis not present

## 2021-08-29 DIAGNOSIS — I4510 Unspecified right bundle-branch block: Secondary | ICD-10-CM | POA: Insufficient documentation

## 2021-08-29 DIAGNOSIS — I480 Paroxysmal atrial fibrillation: Secondary | ICD-10-CM | POA: Diagnosis present

## 2021-08-29 DIAGNOSIS — E039 Hypothyroidism, unspecified: Secondary | ICD-10-CM | POA: Diagnosis not present

## 2021-08-29 NOTE — Progress Notes (Signed)
Primary Care Physician: Bernerd Limbo, MD Primary Cardiologist: Dr Debara Pickett Primary Electrophysiologist: none Referring Physician: Dr Delton Coombes is a 76 y.o. female with a history of HTN, OSA, hypothyroidism, breast cancer, and atrial fibrillation who presents for follow up in the [facility]. The patient was initially diagnosed with atrial fibrillation 04/15/20 after presenting to the ED with symptoms of palpitations. ECG showed afib with RVR. She converted back to SR in the ED. Patient is on Eliquis for a CHADS2VASC score of 3. She does report that she has had palpitations for years but had never been diagnosed with afib. Patient did not tolerate daily diltiazem due to bradycardia and dizziness. She was started on PRN diltiazem.   On follow up today, patient reports that she has done very well from an afib standpoint with no symptomatic episodes. She denies any bleeding issues on anticoagulation.    Today, she denies symptoms of palpitations, chest pain, shortness of breath, orthopnea, PND, lower extremity edema, presyncope, syncope, bleeding, or neurologic sequela. The patient is tolerating medications without difficulties and is otherwise without complaint today.    Atrial Fibrillation Risk Factors:  she does have symptoms or diagnosis of sleep apnea. She is compliant with CPAP therapy.  she does not have a history of rheumatic fever. she does have a history of alcohol use. The patient does not have a history of early familial atrial fibrillation or other arrhythmias.  she has a BMI of Body mass index is 26.54 kg/m.Marland Kitchen Filed Weights   08/29/21 1004  Weight: 70.1 kg     Family History  Problem Relation Age of Onset   Coronary artery disease Brother 39       died of first MI at 23   Coronary artery disease Father        had juvenile DM most of his life, had 3 MI's the first in his early 29's, died at 44   Diabetes Father    Breast cancer  Maternal Grandmother      Atrial Fibrillation Management history:  Previous antiarrhythmic drugs: none Previous cardioversions: none Previous ablations: none CHADS2VASC score: 4 Anticoagulation history: Eliquis   Past Medical History:  Diagnosis Date   Arthritis    hands and lower back   Atrial fibrillation [facility])    Per patient   Breast cancer ([place]) 2005   right lumpectomy, chemotherapy with A/C combo  & Taxol, mammogram 02/06/11 without recurrence   Dyspnea    Echocardiogram 02/20/11: EF 60-65%.;  ETT-myoveiw 12/12 normal with EF 80%   Hypertension    Hypothyroidism    Personal history of chemotherapy 10/2003   Personal history of radiation therapy 03/2004   Past Surgical History:  Procedure Laterality Date   APPENDECTOMY  1955   BREAST BIOPSY  08/29/2003   malignant   BREAST LUMPECTOMY  2005   right   TONSILLECTOMY     "as a child"    Current Outpatient Medications  Medication Sig Dispense Refill   ascorbic Acid (VITAMIN C) 500 MG CPCR Take 500 mg by mouth daily.     B Complex Vitamins (VITAMIN B COMPLEX) TABS Take 0.5-1 tablets by mouth in the morning.     Cholecalciferol (VITAMIN D3) 50 MCG (2000 UT) TABS Take 2,000 Units by mouth daily.     diltiazem (CARDIZEM) 30 MG tablet Take 1 tablet every 4 hours as needed for heart rates over 100 15 tablet 1   ELIQUIS 5 MG TABS tablet  TAKE 1 TABLET(5 MG) BY MOUTH TWICE DAILY 60 tablet 6   Flaxseed Oil OIL Take 1,300 mg by mouth in the morning and at bedtime.     latanoprost (XALATAN) 0.005 % ophthalmic solution Place 1 drop into both eyes at bedtime.     levothyroxine (SYNTHROID, LEVOTHROID) 50 MCG tablet Take 50 mcg by mouth daily.     lisinopril (PRINIVIL,ZESTRIL) 40 MG tablet Take 1 tablet (40 mg total) by mouth daily. 30 tablet 0   Multiple Vitamins-Minerals (CENTRUM SILVER 50+WOMEN) TABS Take 1 tablet by mouth daily with breakfast.     pravastatin (PRAVACHOL) 10 MG tablet Take 10 mg by mouth daily.     No current  facility-administered medications for this encounter.    Allergies  Allergen Reactions   Hctz [Hydrochlorothiazide] Other (See Comments)    Hyponatremia (leading to hospitalization)   Beta Adrenergic Blockers Nausea Only and Other (See Comments)    Metoprolol and Carvedilol have stopped working for patient over time, causing nausea also   Prednisone Other (See Comments)    Had reaction with use prior to chemo- ONLY IN HIGHER DOSES    Social History   Socioeconomic History   Marital status: Divorced    Spouse name: Not on file   Number of children: Not on file   Years of education: Not on file   Highest education level: Not on file  Occupational History   Not on file  Tobacco Use   Smoking status: Former    Packs/day: 1.00    Years: 5.00    Total pack years: 5.00    Types: Cigarettes   Smokeless tobacco: Never   Tobacco comments:    Former smoker 02/27/21  Substance and Sexual Activity   Alcohol use: Not Currently    Alcohol/week: 7.0 standard drinks of alcohol    Types: 7 Glasses of wine per week   Drug use: No   Sexual activity: Never  Other Topics Concern   Not on file  Social History Narrative   Lives alone.  Drives.     Social Determinants of Health   Financial Resource Strain: Not on file  Food Insecurity: Not on file  Transportation Needs: Not on file  Physical Activity: Not on file  Stress: Not on file  Social Connections: Not on file  Intimate Partner Violence: Not on file     ROS- All systems are reviewed and negative except as per the HPI above.  Physical Exam: Vitals:   08/29/21 1004  BP: 124/74  Pulse: 69  Weight: 70.1 kg  Height: '5\' 4"'$  (1.626 m)     GEN- The patient is a well appearing elderly female, alert and oriented x 3 today.   HEENT-head normocephalic, atraumatic, sclera clear, conjunctiva pink, hearing intact, trachea midline. Lungs- Clear to ausculation bilaterally, normal work of breathing Heart- Regular rate and rhythm, no  murmurs, rubs or gallops  GI- soft, NT, ND, + BS Extremities- no clubbing, cyanosis, or edema MS- no significant deformity or atrophy Skin- no rash or lesion Psych- euthymic mood, full affect Neuro- strength and sensation are intact   Wt Readings from Last 3 Encounters:  08/29/21 70.1 kg  02/27/21 67 kg  09/19/20 73.5 kg    EKG today demonstrates  SR, LAFB Vent. rate 69 BPM PR interval 172 ms QRS duration 106 ms QT/QTcB 406/435 ms  Echo 05/23/20 demonstrated  1. Left ventricular ejection fraction, by estimation, is 55 to 60%. The  left ventricle has normal function. The left  ventricle has no regional  wall motion abnormalities. There is mild left ventricular hypertrophy of  the basal-septal segment. Left ventricular diastolic parameters are consistent with Grade I diastolic dysfunction (impaired relaxation).   2. Right ventricular systolic function is normal. The right ventricular  size is normal. There is normal pulmonary artery systolic pressure.   3. Left atrial size was mildly dilated.   4. The mitral valve is normal in structure. Trivial mitral valve  regurgitation. No evidence of mitral stenosis.   5. The aortic valve is tricuspid. Aortic valve regurgitation is not  visualized. No aortic stenosis is present.   6. The inferior vena cava is normal in size with greater than 50%  respiratory variability, suggesting right atrial pressure of 3 mmHg.   Epic records are reviewed at length today  CHA2DS2-VASc Score = 4  The patient's score is based upon: CHF History: 0 HTN History: 1 Diabetes History: 0 Stroke History: 0 Vascular Disease History: 0 Age Score: 2 Gender Score: 1        ASSESSMENT AND PLAN: 1. Paroxysmal Atrial Fibrillation (ICD10:  I48.0) The patient's CHA2DS2-VASc score is 4, indicating a 4.8% annual risk of stroke.   Patient appears to be maintaining SR. Would avoid class IC with inc RBBB and LAFB. Could consider Multaq, dofetilide, or ablation if  her afib becomes more frequent/persistent. Continue Eliquis 5 mg BID Continue diltiazem 30 mg PRN q 4 hours for heart racing.   2. Secondary Hypercoagulable State (ICD10:  D68.69) The patient is at significant risk for stroke/thromboembolism based upon her CHA2DS2-VASc Score of 4.  Continue Apixaban (Eliquis).   3. HTN Stable, no changes today.  4. OSA Patient reports compliance with CPAP therapy. Followed by Dr Claiborne Billings.   Follow up with Dr Debara Pickett as scheduled. AF clinic in one year.    [facility] [address] [place], [place] [address] [phone] 08/29/2021 10:16 AM

## 2021-09-18 ENCOUNTER — Encounter: Payer: Self-pay | Admitting: Cardiovascular Disease

## 2021-09-18 ENCOUNTER — Ambulatory Visit: Payer: Medicare Other | Admitting: Cardiovascular Disease

## 2021-09-18 VITALS — BP 118/70 | HR 78 | Ht 64.5 in | Wt 157.4 lb

## 2021-09-18 DIAGNOSIS — I480 Paroxysmal atrial fibrillation: Secondary | ICD-10-CM | POA: Diagnosis not present

## 2021-09-18 DIAGNOSIS — G4733 Obstructive sleep apnea (adult) (pediatric): Secondary | ICD-10-CM | POA: Diagnosis not present

## 2021-09-18 DIAGNOSIS — R21 Rash and other nonspecific skin eruption: Secondary | ICD-10-CM

## 2021-09-18 DIAGNOSIS — I10 Essential (primary) hypertension: Secondary | ICD-10-CM | POA: Diagnosis not present

## 2021-09-18 DIAGNOSIS — Z7901 Long term (current) use of anticoagulants: Secondary | ICD-10-CM

## 2021-09-18 DIAGNOSIS — E785 Hyperlipidemia, unspecified: Secondary | ICD-10-CM

## 2021-09-18 DIAGNOSIS — G4736 Sleep related hypoventilation in conditions classified elsewhere: Secondary | ICD-10-CM

## 2021-09-18 DIAGNOSIS — E039 Hypothyroidism, unspecified: Secondary | ICD-10-CM

## 2021-09-18 NOTE — Progress Notes (Signed)
Cardiology Office Note    Date:  09/24/2021   ID:  Madison Nunez, DOB 1946/02/20, MRN [id number]  PCP:  Bernerd Limbo, MD  Cardiologist:  Shelva Majestic, MD(sleep); Dr. Debara Pickett  New sleep consultation referred by Dr. Lyman Bishop following initiation of CPAP therapy.   History of Present Illness:  Madison Nunez is a 76 y.o. female who is followed by Dr. Debara Pickett and atrial fibrillation clinic for cardiology care.  Patient has a past history notable for breast CA in remission, family history for CAD, mitral valve prolapse, hypertension with previous labile component,  hyperlipidemia, and anxiety.  She had developed atrial fibrillation and has also been evaluated in the atrial fibrillation clinic.    Due to concerns for obstructive sleep apnea she initially was referred for a home sleep study on August 28, 2020 which revealed at least moderate overall sleep apnea with an AHI of 17.6/h and O2 nadir of 81%.  She underwent a CPAP titration study in the lab on September 19, 2020.  She had difficulty initiating sleep and was restless throughout the night.  CPAP was titrated up to 14 cm of water.  REM sleep was not achieved during the study.  At 14 cm, AHI was still elevated at 9.6/h with RDI 48.1/h and O2 nadir at 92%.  It was recommended that she initiate CPAP auto therapy with an EPR of 3 at a range of 14 to 20 cm of water with heated humidification.  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  An initial download was obtained from April 26 through Jul 03, 2021 when her pressure was at a range of 14 to 20 cm.  Usage was suboptimal with only 4 of 7 days use with average use at 1 hour and 36 minutes.  AHI however was excellent and her 95th percentile pressure was 11.8 with maximum pressure of 12.2.  She felt the initial pressure  was high which led to reduction in her start pressure down to 8 with a maximum pressure of 20.  Subsequent download from June 17 through September 16, 2021 now shows that she is meeting compliance standards with 100% use and usage greater than 4 hours at 97%.  Usage is still suboptimal, however at only 5 hours and 16 minutes.  She initially started with an ResMed N30 mask and required a chinstrap for oral venting.  She also has a ResMed air fit F30 and F20 mask.  Since initiating CPAP therapy, she feels improved with reference to her sleep.  She is unaware of breakthrough snoring.  Her sleep is more restorative.  She has had issues with development of a facial rash.  She is unaware of any recurrent episodes of atrial fibrillation over the past year.  An Epworth Sleepiness Scale score was calculated the office today and this endorsed at 10 as shown below:  Epworth Sleepiness Scale: Situation   Chance of Dozing/Sleeping (0 = never , 1 = slight chance , 2 = moderate chance , 3 = high chance )   sitting and reading 2   watching TV 3   sitting inactive in a public place 0   being a passenger in a motor vehicle for an hour or more 1   lying down in the afternoon 3   sitting and talking  to someone 0   sitting quietly after lunch (no alcohol) 1   while stopped for a few minutes in traffic as the driver 0   Total Score  10    She is unaware of any bruxism, painful restless legs, hypnopompic or hypnagogic hallucinations or cataplectic events.  She is on lisinopril 40 mg for hypertension and has a prescription for diltiazem to take as needed if she develops increased heart rates over 100.  She is anticoagulated on Eliquis 5 mg twice a day.  She has hypothyroidism on levothyroxine 50 mcg and is on low-dose pravastatin for hyperlipidemia.   Past Medical History:  Diagnosis Date   Arthritis    hands and lower back   Atrial fibrillation [facility])    Per patient   Breast cancer [facility]) 2005   right lumpectomy,  chemotherapy with A/C combo  & Taxol, mammogram 02/06/11 without recurrence   Dyspnea    Echocardiogram 02/20/11: EF 60-65%.;  ETT-myoveiw 12/12 normal with EF 80%   Hypertension    Hypothyroidism    Personal history of chemotherapy 10/2003   Personal history of radiation therapy 03/2004    Past Surgical History:  Procedure Laterality Date   APPENDECTOMY  1955   BREAST BIOPSY  08/29/2003   malignant   BREAST LUMPECTOMY  2005   right   TONSILLECTOMY     "as a child"    Current Medications: Outpatient Medications Prior to Visit  Medication Sig Dispense Refill   ascorbic Acid (VITAMIN C) 500 MG CPCR Take 500 mg by mouth daily.     B Complex Vitamins (VITAMIN B COMPLEX) TABS Take 0.5-1 tablets by mouth in the morning.     Cholecalciferol (VITAMIN D3) 50 MCG (2000 UT) TABS Take 2,000 Units by mouth daily.     diltiazem (CARDIZEM) 30 MG tablet Take 1 tablet every 4 hours as needed for heart rates over 100 15 tablet 1   ELIQUIS 5 MG TABS tablet TAKE 1 TABLET(5 MG) BY MOUTH TWICE DAILY 60 tablet 6   Flaxseed Oil OIL Take 1,300 mg by mouth in the morning and at bedtime.     latanoprost (XALATAN) 0.005 % ophthalmic solution Place 1 drop into both eyes at bedtime.     levothyroxine (SYNTHROID, LEVOTHROID) 50 MCG tablet Take 50 mcg by mouth daily.     lisinopril (PRINIVIL,ZESTRIL) 40 MG tablet Take 1 tablet (40 mg total) by mouth daily. 30 tablet 0   Multiple Vitamins-Minerals (CENTRUM SILVER 50+WOMEN) TABS Take 1 tablet by mouth daily with breakfast.     pravastatin (PRAVACHOL) 10 MG tablet Take 10 mg by mouth daily.     No facility-administered medications prior to visit.     Allergies:   Hctz [hydrochlorothiazide], Beta adrenergic blockers, and Prednisone   Social History   Socioeconomic History   Marital status: Divorced    Spouse name: Not on file   Number of children: Not on file   Years of education: Not on file   Highest education level: Not on file  Occupational History    Not on file  Tobacco Use   Smoking status: Former    Packs/day: 1.00    Years: 5.00    Total pack years: 5.00    Types: Cigarettes   Smokeless tobacco: Never   Tobacco comments:    Former smoker 02/27/21  Substance and Sexual Activity   Alcohol use: Not Currently    Alcohol/week: 7.0 standard drinks of alcohol    Types: 7 Glasses of wine  per week   Drug use: No   Sexual activity: Never  Other Topics Concern   Not on file  Social History Narrative   Lives alone.  Drives.     Social Determinants of Health   Financial Resource Strain: Not on file  Food Insecurity: Not on file  Transportation Needs: Not on file  Physical Activity: Not on file  Stress: Not on file  Social Connections: Not on file    Social history is notable and that she was born in [place].  She is divorced and does not have children.  She retired 3 years ago from the [employer].  Family History:  The patient's family history includes Breast cancer in her maternal grandmother; Coronary artery disease in her father; Coronary artery disease (age of onset: 23) in her brother; Diabetes in her father.  Both parents are deceased.  She had 1 brother who is also deceased  ROS General: Negative; No fevers, chills, or night sweats;  HEENT: Negative; No changes in vision or hearing, sinus congestion, difficulty swallowing Pulmonary: Negative; No cough, wheezing, shortness of breath, hemoptysis Cardiovascular: History of PAF, GI: Negative; No nausea, vomiting, diarrhea, or abdominal pain GU: Negative; No dysuria, hematuria, or difficulty voiding Musculoskeletal: Negative; no myalgias, joint pain, or weakness Hematologic/Oncology: History of breast cancer in remission Endocrine: Negative; no heat/cold intolerance; no diabetes Neuro: Negative; no changes in balance, headaches Skin: Facial rash exacerbated by her mask Psychiatric: Positive for anxiety Sleep: See HPI Other comprehensive 14 point system  review is negative.   PHYSICAL EXAM:   VS:  BP 118/70   Pulse 78   Ht 5' 4.5" (1.638 m)   Wt 157 lb 6.4 oz (71.4 kg)   LMP 02/19/2011   SpO2 98%   BMI 26.60 kg/m     Repeat blood pressure by me was 120/72  Wt Readings from Last 3 Encounters:  09/18/21 157 lb 6.4 oz (71.4 kg)  08/29/21 154 lb 9.6 oz (70.1 kg)  02/27/21 147 lb 9.6 oz (67 kg)    General: Alert, oriented, no distress.  Skin: Erythematous facial rash more prominent on the right cheek area but also on the chin HEENT: Normocephalic, atraumatic. Pupils equal round and reactive to light; sclera anicteric; extraocular muscles intact; F Nose without nasal septal hypertrophy Mouth/Parynx benign; Mallinpatti scale 3 Neck: No JVD, no carotid bruits; normal carotid upstroke Lungs: clear to ausculatation and percussion; no wheezing or rales Chest wall: without tenderness to palpitation Heart: PMI not displaced, RRR, s1 s2 normal, 1/6 systolic murmur, no diastolic murmur, no rubs, gallops, thrills, or heaves Abdomen: soft, nontender; no hepatosplenomehaly, BS+; abdominal aorta nontender and not dilated by palpation. Back: no CVA tenderness Pulses 2+ Musculoskeletal: full range of motion, normal strength, no joint deformities Extremities: no clubbing cyanosis or edema, Homan's sign negative  Neurologic: grossly nonfocal; Cranial nerves grossly wnl Psychologic: Normal mood and affect   Studies/Labs Reviewed:   EKG:  EKG isnot ordered today.  I personally reviewed the ECG from August 29, 2021 which showed normal sinus rhythm with left axis deviation, incomplete right bundle branch block, and probable LVH by voltage criteria  Recent Labs:    Latest Ref Rng & Units 02/28/2021    8:47 PM 04/24/2020    2:37 PM 04/15/2020    3:06 AM  BMP  Glucose 70 - 99 mg/dL 99  97  114   BUN 8 - 23 mg/dL _0 Creatinine 0.44 - 1.00 mg/dL  0.56  0.65  0.69   Sodium 135 - 145 mmol/L 134  134  138   Potassium 3.5 - 5.1 mmol/L 3.9  4.0   3.7   Chloride 98 - 111 mmol/L 100  100  101   CO2 22 - 32 mmol/L _0 Calcium 8.9 - 10.3 mg/dL 9.5  9.1  9.4         Latest Ref Rng & Units 02/28/2021    8:47 PM 04/24/2020    2:37 PM 10/02/2013    1:07 PM  Hepatic Function  Total Protein 6.5 - 8.1 g/dL 7.5  6.8  7.8   Albumin 3.5 - 5.0 g/dL 4.0  3.7  4.1   AST 15 - 41 U/L _1 ALT 0 - 44 U/L _2 Alk Phosphatase 38 - 126 U/L 70  62  102   Total Bilirubin 0.3 - 1.2 mg/dL 0.8  0.7  0.4        Latest Ref Rng & Units 02/28/2021    8:47 PM 04/24/2020    2:37 PM 04/15/2020    3:06 AM  CBC  WBC 4.0 - 10.5 K/uL 5.6  4.6  4.8   Hemoglobin 12.0 - 15.0 g/dL 14.9  14.4  15.0   Hematocrit 36.0 - 46.0 % 44.9  42.7  44.7   Platelets 150 - 400 K/uL 248  223  234    Lab Results  Component Value Date   MCV 95.3 02/28/2021   MCV 94.9 04/24/2020   MCV 95.1 04/15/2020   Lab Results  Component Value Date   TSH 0.904 04/24/2020   No results found for: "HGBA1C"   BNP    Component Value Date/Time   BNP 19.7 03/01/2021 0828    ProBNP No results found for: "PROBNP"   Lipid Panel  No results found for: "CHOL", "TRIG", "HDL", "CHOLHDL", "VLDL", "LDLCALC", "LDLDIRECT", "LABVLDL"   RADIOLOGY: No results found.   Additional studies/ records that were reviewed today include:   Patient Name: Madison, Nunez Date: 08/28/2020 Gender: Female D.O.B: 1945/12/15 Age (years): 75 Referring Provider: Nadean Corwin Hilty Height (inches): 65 Interpreting Physician: Shelva Majestic MD, ABSM Weight (lbs): 162 RPSGT: Jacolyn Reedy BMI: 27 MRN: [id number] Neck Size: 13.50   CLINICAL INFORMATION Sleep Study Type: HST   Indication for sleep study: fatigue, PAF, non-restorative sleep   Epworth Sleepiness Score: 7   SLEEP STUDY TECHNIQUE A multi-channel overnight portable sleep study was performed. The channels recorded were: nasal airflow, thoracic respiratory movement, and oxygen saturation with a pulse  oximetry. Snoring was also monitored.   MEDICATIONS apixaban (ELIQUIS) 5 MG TABS tablet ascorbic Acid (VITAMIN C) 500 MG CPCR B Complex Vitamins (VITAMIN B COMPLEX) TABS CEQUA 0.09 % SOLN Cholecalciferol (VITAMIN D3) 50 MCG (2000 UT) TABS diltiazem (CARDIZEM) 30 MG tablet Flaxseed Oil OIL latanoprost (XALATAN) 0.005 % ophthalmic solution levothyroxine (SYNTHROID, LEVOTHROID) 50 MCG tablet lisinopril (PRINIVIL,ZESTRIL) 40 MG tablet Multiple Vitamins-Minerals (CENTRUM SILVER 50+WOMEN) TABS Patient self administered medications include: N/A.   SLEEP ARCHITECTURE Patient was studied for 442.2 minutes. The sleep efficiency was 100.0 % and the patient was supine for 97.5%. The arousal index was 0.0 per hour.   RESPIRATORY PARAMETERS The overall AHI was 17.6 per hour, with a central apnea index of 0 per hour.    The oxygen nadir was 81% during sleep. Time spent < 89% was 12.1 minutes.   CARDIAC DATA Mean heart  rate during sleep was 58.9 bpm.   IMPRESSIONS - Moderate obstructive sleep apnea occurred during this study (AHI  17.6/h). The severity during REM sleep cannot be assessed on this home study. - Moderate oxygen desaturation was noted during this study (Min O2 81%). - No snoring was audible during this study.   DIAGNOSIS - Obstructive Sleep Apnea (G47.33) - Nocturnal Hypoxemia (G47.36)   RECOMMENDATIONS - Recommend a CPAP titation study to determine optimal treatment of her sleep disordered breathing. If unable to schedule an in-lab titration, initate Auto PAP with EPR of 3  at 6 - 18 cm of water. - Effort should be made to optimize nasal and oropharyngeal patency.  - Avoid alcohol, sedatives and other CNS depressants that may worsen sleep apnea and disrupt normal sleep architecture. - Sleep hygiene should be reviewed to assess factors that may improve sleep quality. - Weight management and regular exercise should be initiated or continued. - Recommend a download in 30 days and  sleep clinic evaluation after one month of therapy.     09/19/2020 CLINICAL INFORMATION The patient is referred for a CPAP titration to treat sleep apnea.   Date of HST: 08/28/2020:  AHI 17.6/h; O2 nadir 81%.   SLEEP STUDY TECHNIQUE As per the AASM Manual for the Scoring of Sleep and Associated Events v2.3 (April 2016) with a hypopnea requiring 4% desaturations.   The channels recorded and monitored were frontal, central and occipital EEG, electrooculogram (EOG), submentalis EMG (chin), nasal and oral airflow, thoracic and abdominal wall motion, anterior tibialis EMG, snore microphone, electrocardiogram, and pulse oximetry. Continuous positive airway pressure (CPAP) was initiated at the beginning of the study and titrated to treat sleep-disordered breathing.   MEDICATIONS apixaban (ELIQUIS) 5 MG TABS tablet ascorbic Acid (VITAMIN C) 500 MG CPCR B Complex Vitamins (VITAMIN B COMPLEX) TABS CEQUA 0.09 % SOLN Cholecalciferol (VITAMIN D3) 50 MCG (2000 UT) TABS diltiazem (CARDIZEM) 30 MG tablet Flaxseed Oil OIL latanoprost (XALATAN) 0.005 % ophthalmic solution levothyroxine (SYNTHROID, LEVOTHROID) 50 MCG tablet lisinopril (PRINIVIL,ZESTRIL) 40 MG tablet Multiple Vitamins-Minerals (CENTRUM SILVER 50+WOMEN) TABS TURMERIC PO Medications self-administered by patient taken the night of the study : N/A   TECHNICIAN COMMENTS Comments added by technician: Patient had difficulty initiating sleep. Patient was restless all through the night. Patient could not tolerate CPAP Comments added by scorer: N/A   RESPIRATORY PARAMETERS Optimal PAP Pressure (cm):  14        AHI at Optimal Pressure (/hr):            9.6 Overall Minimal O2 (%):         84.0     Supine % at Optimal Pressure (%):    100 Minimal O2 at Optimal Pressure (%): 92.0        SLEEP ARCHITECTURE The study was initiated at 9:45:30 PM and ended at 4:30:49 AM.   Sleep onset time was 29.7 minutes and the sleep efficiency was 25.2%%. The  total sleep time was 102 minutes.   The patient spent 14.2%% of the night in stage N1 sleep, 85.8%% in stage N2 sleep, 0.0%% in stage N3 and 0% in REM.Stage REM latency was N/A minutes   Wake after sleep onset was 273.7. Alpha intrusion was absent. Supine sleep was 100.00%.   CARDIAC DATA The 2 lead EKG demonstrated sinus rhythm. The mean heart rate was 64.6 beats per minute. Other EKG findings include: PVCs.   LEG MOVEMENT DATA The total Periodic Limb Movements of Sleep (PLMS) were 0. The PLMS  index was 0.0. A PLMS index of <15 is considered normal in adults.   IMPRESSIONS - CPAP was initiated at 5 cm and was titrated to 14 cm of water. REM sleep was not achieved during the study.  At 14 cm AHI was 9.6/h, RDI 48.1/h, O2 nadir 92%. - Central sleep apnea was not noted during this titration (CAI = 1.8/h). - Moderate oxygen desaturations to a nadir of 84.0% at 6 cm of water. - No snoring was audible during this study with minimal noted on snoring sensor. - 2-lead EKG demonstrated: PVCs - Clinically significant periodic limb movements were not noted during this study. Arousals associated with PLMs were rare.   DIAGNOSIS - Obstructive Sleep Apnea (G47.33)   RECOMMENDATIONS - Recommend an initial trial of CPAP Auto therapy with EPR of 3 at 14 - 20 cm H2O with heated humidification.  A Small size Resmed Full Face Mask AirFit F20 mask was used for the titration. - Effort should be made to optimize nasal and oropharyngeal patency. - Avoid alcohol, sedatives and other CNS depressants that may worsen sleep apnea and disrupt normal sleep architecture. - Sleep hygiene should be reviewed to assess factors that may improve sleep quality. - Weight management and regular exercise should be initiated or continued. - Recommend a download in 30 days and sleep clinic evaluation after 4 weeks of therapy.  ASSESSMENT:    1. OSA (obstructive sleep apnea)   2. Hypoxemia associated with sleep   3. Essential  hypertension   4. Paroxysmal atrial fibrillation (HCC)   5. Facial rash   6. Hypothyroidism, unspecified type   7. Dyslipidemia   8. Anticoagulated    PLAN:  Madison Nunez is a 76 year old female who is followed by Dr. Debara Pickett and has been evaluated in the atrial fibrillation clinic following an episode of atrial fibrillation.  She has a history of hypertension, remote breast CA, hypothyroidism, and has been found to have obstructive sleep apnea.  I reviewed her initial home sleep study which confirmed at least moderate overall sleep apnea with an AHI of 17.6 and oxygen desaturation to nadir 81%.  I thoroughly reviewed her CPAP titration study from September 19, 2020.  On that titration study, she was titrated up to 14 cm of water and did not achieve any REM sleep during the evaluation.  At 14 cm, AHI remained elevated at 9.6 with RDI 48.1.  Unfortunately, due to significant semiconductor supply chain issues, she did not receive her new CPAP ResMed AirSense 11 AutoSet unit until June 27, 2021.  On her initial download, she did not meet compliance but most recent download from June 17 through September 16, 2021 confirms compliance being met with usage at 100%.  Her initial settings were slightly reduced because initially she felt the pressure was high and on her most recent download her pressure range has been at a range of 8 to 20 cm of water.  Her 95th percentile pressure was 11.3 with maximum average pressure 12.3 L/min.  AHI was 2.3.  Typically she goes to bed at 10 PM and wakes up between 5 and 6 AM.  She has had issues with different masks and has developed an apparent allergy to some of the mask components.  She does have a facial rash.  Usage is only 5 hours and 16 minutes per night.  I had a long discussion with her today and discussed optimal sleep duration for an adult at 7 and 9 hours.  I discussed normal sleep  architecture and potential disruptive sleep architecture secondary to untreated sleep apnea.  I  discussed its effects on blood pressure control, increased risk for nocturnal arrhythmias, with significant increased risk for atrial fibrillation.  In addition I discussed its effects on insulin resistance, increased inflammatory markers leading to inflammation, as well as increased GERD.  I also discussed potential nocturnal hypoxemia contributing to nocturnal ischemia both cardiac as well as cerebrovascular.  I reviewed the pathophysiology associated with increased nocturia as a result of untreated sleep apnea.  Presently, her sleep has improved with therapy.  An Epworth Sleepiness Scale score was calculated in the office today and this endorsed at 10 suggesting borderline residual sleepiness.  I suspect this is predominantly secondary to her inadequate sleep duration at less than 5 and half hours of use per night.  I have recommended she she have a dermatologic evaluation for her skin rash.  She believes she is tolerating the initiation of pressure at 8 cm better than initially.  I will slightly adjust this to a 9 to 16 cm pressure range.  I am also increasing her ramp start pressure from 4 up to 6 cm of water.  She has had some issues with leg swelling and has been using compression stockings.  Her blood pressure today is excellent and repeat by me was 120/72 on lisinopril 40 mg daily.  She continues to be on levothyroxine 50 mcg for hypothyroidism and is on low-dose pravastatin for hyperlipidemia.  She is in need for new filters for CPAP machine.  We will contact her DME company, [employer].  As long as she is stable I will see her In 1 year for follow-up sleep evaluation.  Medication Adjustments/Labs and Tests Ordered: Current medicines are reviewed at length with the patient today.  Concerns regarding medicines are outlined above.  Medication changes, Labs and Tests ordered today are listed in the Patient Instructions below. Patient Instructions  Medication Instructions:  Your physician  recommends that you continue on your current medications as directed. Please refer to the Current Medication list given to you today.  *If you need a refill on your cardiac medications before your next appointment, please call your pharmacy*   Follow-Up: At [facility], you and your health needs are our priority.  As part of our continuing mission to provide you with exceptional heart care, we have created designated Provider Care Teams.  These Care Teams include your primary Cardiologist (physician) and Advanced Practice Providers (APPs -  Physician Assistants and Nurse Practitioners) who all work together to provide you with the care you need, when you need it.  We recommend signing up for the patient portal called "MyChart".  Sign up information is provided on this After Visit Summary.  MyChart is used to connect with patients for Virtual Visits (Telemedicine).  Patients are able to view lab/test results, encounter notes, upcoming appointments, etc.  Non-urgent messages can be sent to your provider as well.   To learn more about what you can do with MyChart, go to [email or web address].    Your next appointment:   12 month(s)  The format for your next appointment:   In Person  Provider:   Shelva Majestic, MD  Other Instructions Sleep Clinic   Signed, Shelva Majestic, MD, [facility], [place], American Board of Sleep Medicine  09/24/2021 [address] [place], [place], [place]  [address] Phone: [phone]

## 2021-09-18 NOTE — Patient Instructions (Signed)

## 2021-09-24 ENCOUNTER — Encounter: Payer: Self-pay | Admitting: Cardiovascular Disease

## 2021-11-29 ENCOUNTER — Telehealth (HOSPITAL_COMMUNITY): Payer: Self-pay

## 2021-11-29 NOTE — Telephone Encounter (Signed)
Patient called in regarding her blood pressure. B/P 160s range top number over 94 range and heart rate 80 range.  She states she is not having A-fib. Patient states she has been eating a lot of salt lately.  Instructed paitent to limit her salt intake. She can try Mrs. Dash salt. Patient advised to monitor her blood pressure for the next week and if it continues to be a problem she should discuss with Dr. Debara Pickett at her follow up appointment. Patient verbalized understanding.

## 2021-12-06 ENCOUNTER — Other Ambulatory Visit (HOSPITAL_COMMUNITY): Payer: Self-pay | Admitting: *Deleted

## 2021-12-06 MED ORDER — DILTIAZEM HCL 30 MG PO TABS: ORAL_TABLET | ORAL | 1 refills | Status: DC

## 2021-12-07 ENCOUNTER — Encounter: Payer: Self-pay | Admitting: Internal Medicine

## 2021-12-07 ENCOUNTER — Ambulatory Visit: Payer: Medicare Other | Attending: Internal Medicine | Admitting: Internal Medicine

## 2021-12-07 VITALS — BP 132/78 | HR 75 | Ht 64.5 in | Wt 157.0 lb

## 2021-12-07 DIAGNOSIS — I10 Essential (primary) hypertension: Secondary | ICD-10-CM | POA: Diagnosis not present

## 2021-12-07 DIAGNOSIS — Z7901 Long term (current) use of anticoagulants: Secondary | ICD-10-CM | POA: Diagnosis not present

## 2021-12-07 DIAGNOSIS — I480 Paroxysmal atrial fibrillation: Secondary | ICD-10-CM | POA: Diagnosis not present

## 2021-12-07 NOTE — Progress Notes (Signed)
OFFICE NOTE  Chief Complaint:  Follow-up  Primary Care Physician: Bernerd Limbo, MD  HPI:  Madison Nunez is a 76 y.o. female with a past medial history significant for breast CA in remission (port still in place), family history of CAD, possible mitral valve prolapse, HTN, hyperlipidemia and anxiety. She presents with several days of labile hypertension, flushing and palpitations, but no complaints of frank chest pain. Troponin has been negative x 5. EKG personally reviewed shows a sinus tachy, LVH with strain, incomplete RBBB. She was previously on carvedilol but then developed flushing. Was on metoprolol, but I'm not sure that controlled her BP - also on lisinopril. Switched by PCP to amlodipine and feels she is now having the same symptoms of flushing. During her hospitalization, I advised stopping her amlodipine and starting low dose toprol. A 30 day monitor was ordered. I have personally reviewed her monitor which shows no significant arrhythmias or extrasystoles. She reports feeling well without further palpitations. She is not taking her b-blocker.  07/18/2020  Madison Nunez returns today for follow-up.  She has been seen most recently by the A. fib clinic.  She is now on Eliquis.  She was taking long-acting diltiazem but had side effects with it and felt like she was in a fog.  She is now using diltiazem as needed.  She had several episodes of atrial fibrillation but probably has less than 4 or 5 events a month.  She uses diltiazem as needed for that.  There was some discussion between her and Mr. Marlene Lard about antiarrhythmic therapy.  I mentioned that today.  She also feels that she has some fatigue.  This might be related to A. fib or other possible causes.  We discussed the possibility of sleep apnea.  She says she lives alone and is not aware that she snores but does feel tired during the day has to take a nap and oftentimes has a nonrestorative sleep at night.  12/07/2021  Madison Nunez  is seen today in follow-up.  Overall she seems to be doing well.  She did bring in a list of her blood pressure readings and noted a few readings that were a little high with associated mild elevation in heart rate to the low 100s.  She is concerned as to whether these might be related to A-fib but does not have clear recurrent palpitations.  She has been followed in the A-fib clinic and has been maintaining sinus rhythm.  EKG today again shows sinus rhythm.  She is using a as needed diltiazem strategy due to underlying bifascicular block.  Blood pressure today was well controlled.  She denies any chest pain or shortness of breath.  PMHx:  Past Medical History:  Diagnosis Date   Arthritis    hands and lower back   Atrial fibrillation [facility])    Per patient   Breast cancer ([place]) 2005   right lumpectomy, chemotherapy with A/C combo  & Taxol, mammogram 02/06/11 without recurrence   Dyspnea    Echocardiogram 02/20/11: EF 60-65%.;  ETT-myoveiw 12/12 normal with EF 80%   Hypertension    Hypothyroidism    Personal history of chemotherapy 10/2003   Personal history of radiation therapy 03/2004    Past Surgical History:  Procedure Laterality Date   APPENDECTOMY  1955   BREAST BIOPSY  08/29/2003   malignant   BREAST LUMPECTOMY  2005   right   TONSILLECTOMY     "as a child"    FAMHx:  Family History  Problem Relation Age of Onset   Coronary artery disease Brother 29       died of first MI at 43   Coronary artery disease Father        had juvenile DM most of his life, had 3 MI's the first in his early 20's, died at 37   Diabetes Father    Breast cancer Maternal Grandmother     SOCHx:   reports that she has quit smoking. Her smoking use included cigarettes. She has a 5.00 pack-year smoking history. She has never used smokeless tobacco. She reports that she does not currently use alcohol after a past usage of about 7.0 standard drinks of alcohol per week. She reports that she does not use  drugs.  ALLERGIES:  Allergies  Allergen Reactions   Hctz [Hydrochlorothiazide] Other (See Comments)    Hyponatremia (leading to hospitalization)   Beta Adrenergic Blockers Nausea Only and Other (See Comments)    Metoprolol and Carvedilol have stopped working for patient over time, causing nausea also   Prednisone Other (See Comments)    Had reaction with use prior to chemo- ONLY IN HIGHER DOSES    ROS: Pertinent items noted in HPI and remainder of comprehensive ROS otherwise negative.  HOME MEDS: Current Outpatient Medications on File Prior to Visit  Medication Sig Dispense Refill   ascorbic Acid (VITAMIN C) 500 MG CPCR Take 500 mg by mouth daily.     B Complex Vitamins (VITAMIN B COMPLEX) TABS Take 0.5-1 tablets by mouth in the morning.     Cholecalciferol (VITAMIN D3) 50 MCG (2000 UT) TABS Take 2,000 Units by mouth daily.     diltiazem (CARDIZEM) 30 MG tablet Take 1 tablet every 4 hours as needed for heart rates over 100 15 tablet 1   ELIQUIS 5 MG TABS tablet TAKE 1 TABLET(5 MG) BY MOUTH TWICE DAILY 60 tablet 6   Flaxseed Oil OIL Take 1,300 mg by mouth in the morning and at bedtime.     latanoprost (XALATAN) 0.005 % ophthalmic solution Place 1 drop into both eyes at bedtime.     levothyroxine (SYNTHROID, LEVOTHROID) 50 MCG tablet Take 50 mcg by mouth daily.     lisinopril (PRINIVIL,ZESTRIL) 40 MG tablet Take 1 tablet (40 mg total) by mouth daily. 30 tablet 0   Multiple Vitamins-Minerals (CENTRUM SILVER 50+WOMEN) TABS Take 1 tablet by mouth daily with breakfast.     pravastatin (PRAVACHOL) 10 MG tablet Take 10 mg by mouth daily.     No current facility-administered medications on file prior to visit.    LABS/IMAGING: No results found for this or any previous visit (from the past 48 hour(s)). No results found.  LIPID PANEL: No results found for: "CHOL", "TRIG", "HDL", "CHOLHDL", "VLDL", "LDLCALC", "LDLDIRECT"   WEIGHTS: Wt Readings from Last 3 Encounters:  12/07/21 157 lb  (71.2 kg)  09/18/21 157 lb 6.4 oz (71.4 kg)  08/29/21 154 lb 9.6 oz (70.1 kg)    VITALS: BP 132/78 (BP Location: Left Arm, Patient Position: Sitting, Cuff Size: Normal)   Pulse 75   Ht 5' 4.5" (1.638 m)   Wt 157 lb (71.2 kg)   LMP 02/19/2011   BMI 26.53 kg/m   EXAM: General appearance: alert and no distress Neck: no carotid bruit, no JVD and thyroid not enlarged, symmetric, no tenderness/mass/nodules Lungs: clear to auscultation bilaterally Heart: regular rate and rhythm, S1, S2 normal, no murmur, click, rub or gallop Abdomen: soft, non-tender; bowel sounds normal; no  masses,  no organomegaly Extremities: extremities normal, atraumatic, no cyanosis or edema Pulses: 2+ and symmetric Skin: Skin color, texture, turgor normal. No rashes or lesions Neurologic: Grossly normal Psych: Pleasant  EKG: Normal sinus rhythm at 75, bifascicular block, voltage criteria for LVH-personally reviewed  ASSESSMENT: PAF-on Eliquis Hypertension Fatigue, nonrestorative sleep  PLAN: 1.   Mrs. Herbison has not had any documented recurrent atrial fibrillation.  She is considered getting a cardia mobile device.  Blood pressure has been a little labile recently however not poorly controlled.  I advised remaining on her lisinopril and if she shows some evidence of persistently elevated blood pressure, consider adding additional agent such as amlodipine.  I do not think there is a lot of evidence to switch to an ARB other than the fact that it is longer acting.  We will continue her current therapies.  She is anticoagulated on Eliquis.  Plan follow-up annually or sooner as necessary.  Pixie Casino, MD, [facility], [place] Director of the Advanced Lipid Disorders &  Cardiovascular Risk Reduction Clinic Diplomate of the American Board of Clinical Lipidology Attending Cardiologist  Direct Dial: [phone]  Fax: [phone]  Website:  [email or web address]   Nadean Corwin  Beni Turrell 12/07/2021, 10:00 AM

## 2021-12-07 NOTE — Patient Instructions (Signed)
Medication Instructions:  Your physician recommends that you continue on your current medications as directed. Please refer to the Current Medication list given to you today.  *If you need a refill on your cardiac medications before your next appointment, please call your pharmacy*   Follow-Up: At [facility], you and your health needs are our priority.  As part of our continuing mission to provide you with exceptional heart care, we have created designated Provider Care Teams.  These Care Teams include your primary Cardiologist (physician) and Advanced Practice Providers (APPs -  Physician Assistants and Nurse Practitioners) who all work together to provide you with the care you need, when you need it.  We recommend signing up for the patient portal called "MyChart".  Sign up information is provided on this After Visit Summary.  MyChart is used to connect with patients for Virtual Visits (Telemedicine).  Patients are able to view lab/test results, encounter notes, upcoming appointments, etc.  Non-urgent messages can be sent to your provider as well.   To learn more about what you can do with MyChart, go to [email or web address].    Your next appointment:   12 month(s)  The format for your next appointment:   In Person  Provider:   Kenneth C Hilty, MD    

## 2022-01-06 IMAGING — MG DIGITAL SCREENING BILAT W/ TOMO W/ CAD
6 of 10 series · 6 of 30 positions shown · non-contrast
Comparison: Previous exam(s).

CLINICAL DATA: Screening.

EXAM:
DIGITAL SCREENING BILATERAL MAMMOGRAM WITH TOMO AND CAD

[L CC synth-2D]
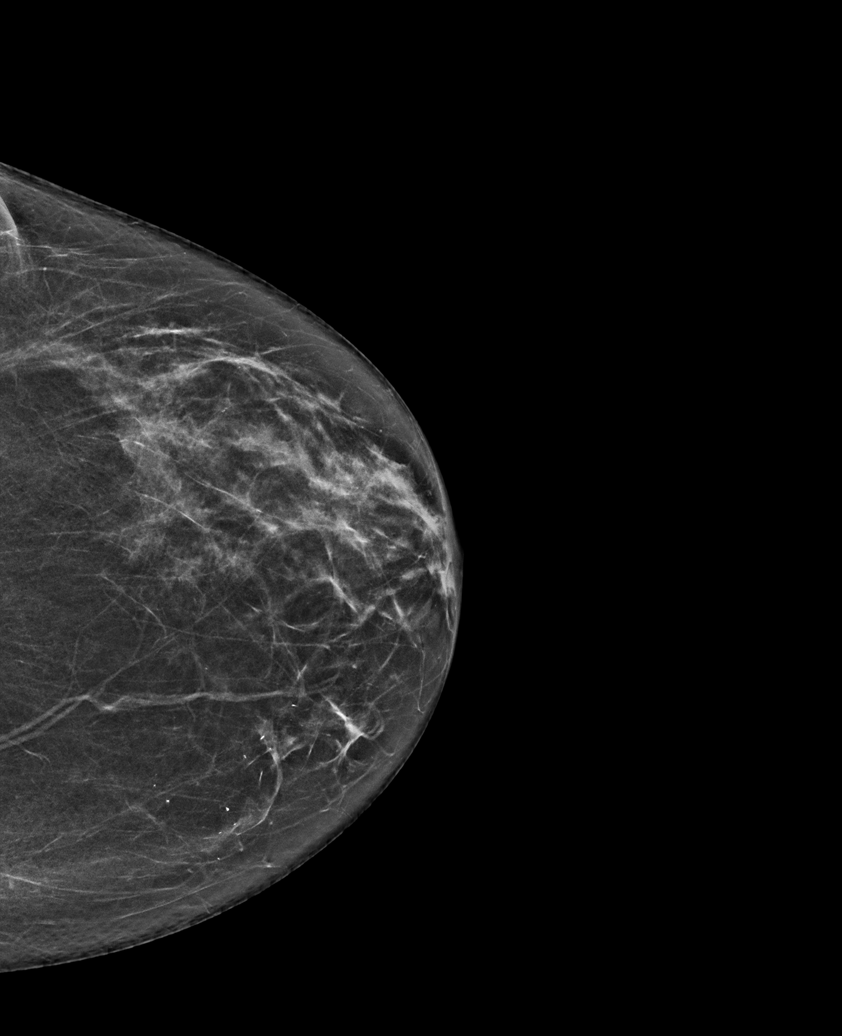

[R CC synth-2D]
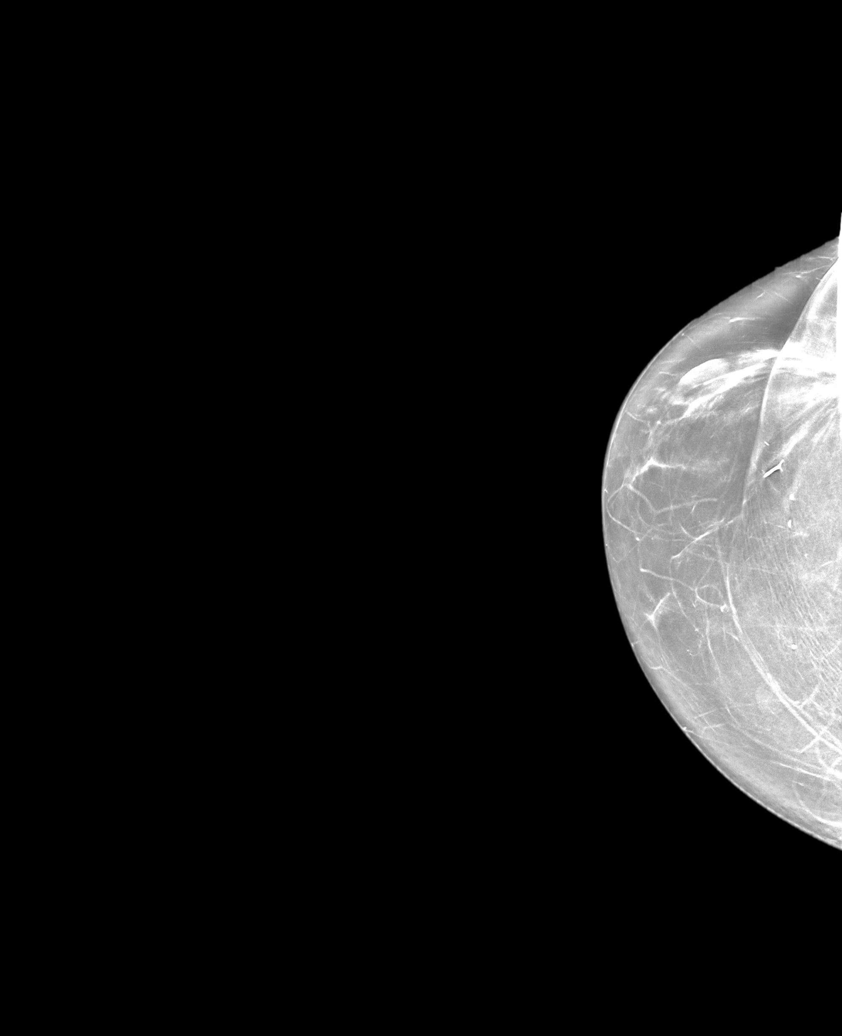

[L MLO synth-2D]
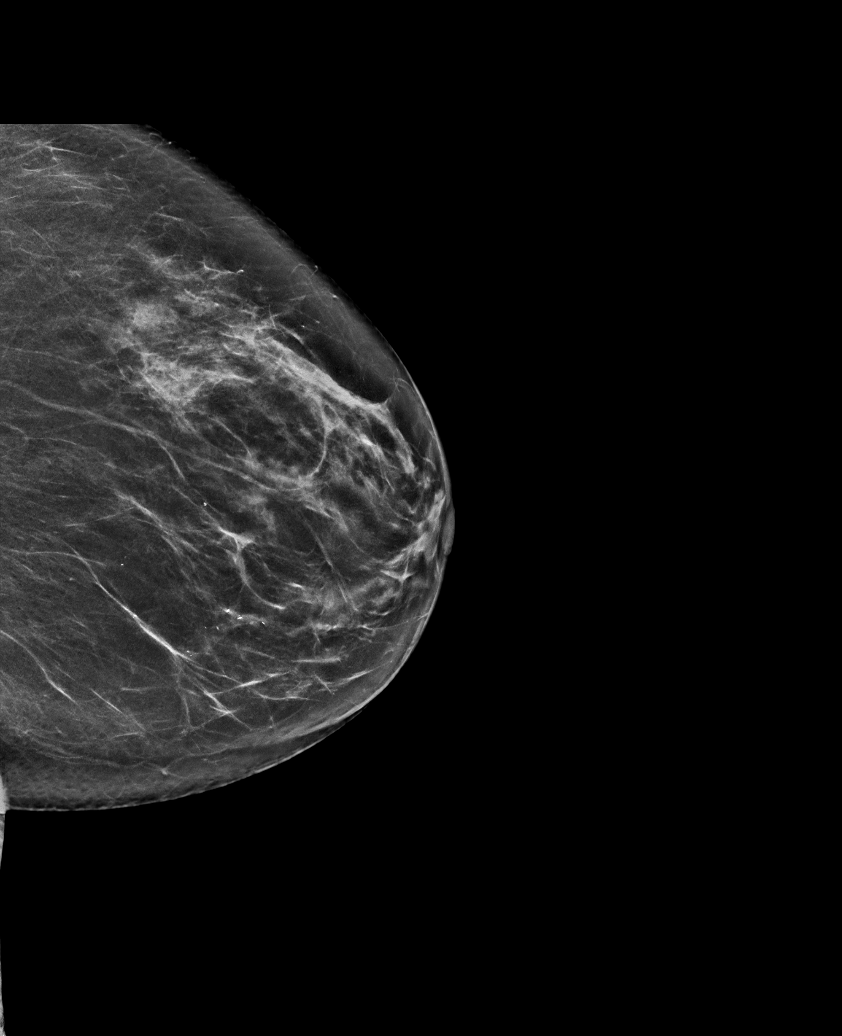

[R MLO synth-2D]
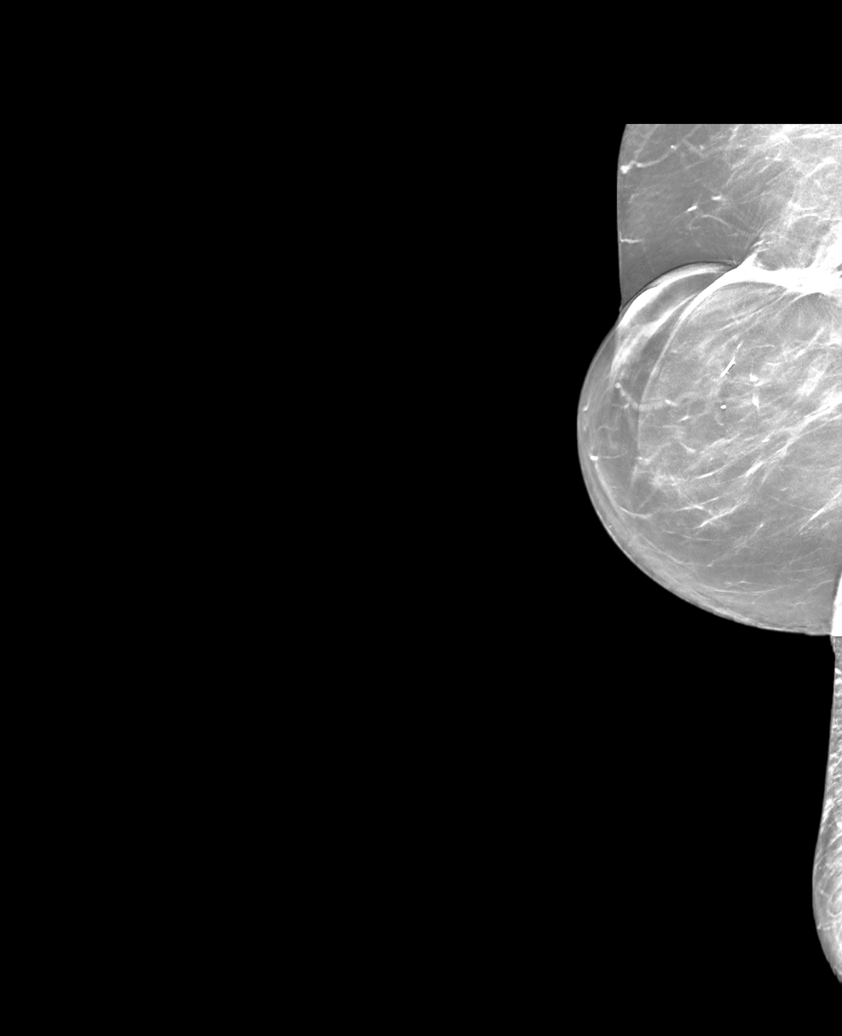

[R XCCL synth-2D]
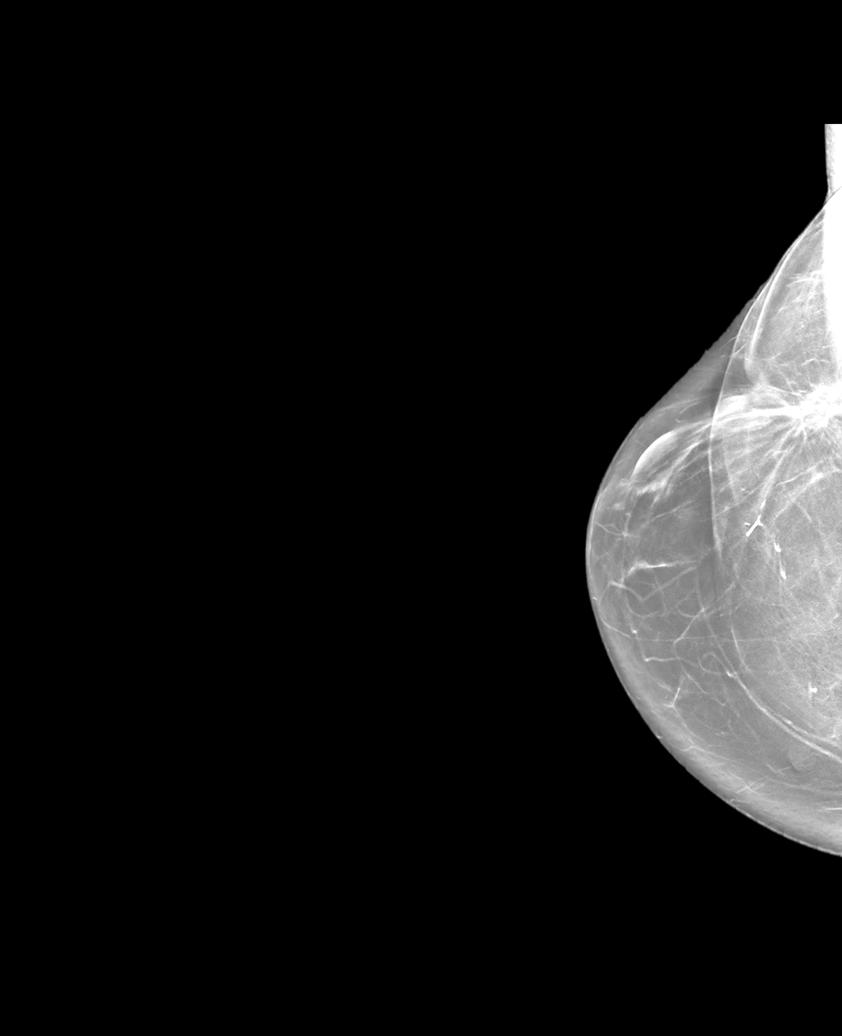

[R CC tomo · tomo slice 33/64.0]
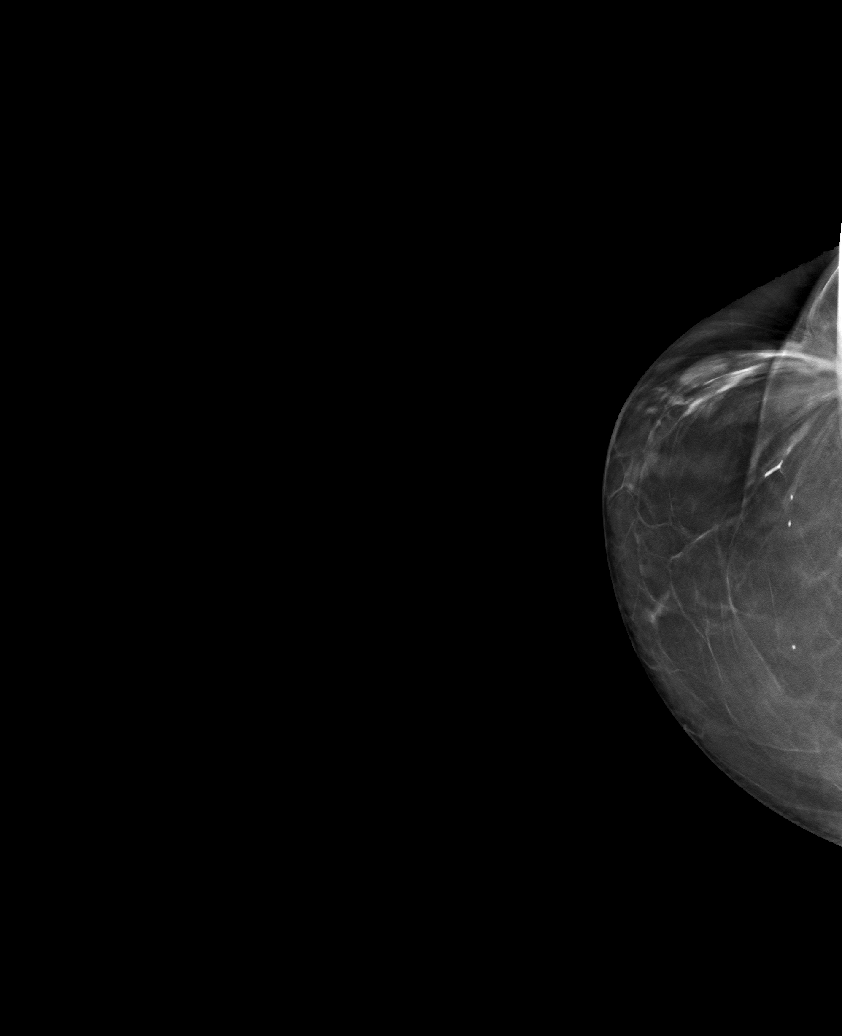

[6 of 30 positions shown; findings below may reference images not displayed]

ACR Breast Density Category c: The breast tissue is heterogeneously
dense, which may obscure small masses.
FINDINGS: In the left breast, a possible asymmetry and a group of
calcifications warrants further evaluation. In the right breast, no
findings suspicious for malignancy. Images were processed with CAD.
IMPRESSION: Further evaluation is suggested for possible asymmetry and
calcifications in the left breast.

RECOMMENDATION:
Diagnostic mammogram and possibly ultrasound of the left breast.
(Code:R4-0-TTN)

The patient will be contacted regarding the findings, and additional
imaging will be scheduled.

BI-RADS CATEGORY  0: Incomplete. Need additional imaging evaluation
and/or prior mammograms for comparison.

## 2022-01-16 ENCOUNTER — Other Ambulatory Visit: Payer: Self-pay | Admitting: Internal Medicine

## 2022-01-16 DIAGNOSIS — I480 Paroxysmal atrial fibrillation: Secondary | ICD-10-CM

## 2022-01-16 NOTE — Telephone Encounter (Signed)
Prescription refill request for Eliquis received. Indication: Afib  Last office visit: 12/07/21 (Hilty) Scr: 0.56 (02/28/21)  Age: 76 Weight: 71.2kg  Appropriate dose and refill sent to requested pharmacy.

## 2022-04-03 ENCOUNTER — Telehealth: Payer: Self-pay | Admitting: *Deleted

## 2022-04-03 NOTE — Telephone Encounter (Signed)
Patient called in to ask when is she supposed to get new CPAP supplies. States that she hasn't gotten any supplies other than the mask samples provided to her by me since she had her machine. After reviewing her chart the patient is a [place] patient with [facility] insurance, therefore she cannot be transferred to [place] for service. Records and order to manage CPAP supplies were sent to [facility] today by me.

## 2022-06-10 ENCOUNTER — Other Ambulatory Visit: Payer: Self-pay | Admitting: Family Medicine

## 2022-06-10 DIAGNOSIS — R928 Other abnormal and inconclusive findings on diagnostic imaging of breast: Secondary | ICD-10-CM

## 2022-06-24 ENCOUNTER — Ambulatory Visit
Admission: RE | Admit: 2022-06-24 | Discharge: 2022-06-24 | Disposition: A | Discharge status: Home / Self Care | Payer: Medicare Other | Source: Ambulatory Visit | Attending: Family Medicine | Admitting: Family Medicine

## 2022-06-24 DIAGNOSIS — R928 Other abnormal and inconclusive findings on diagnostic imaging of breast: Secondary | ICD-10-CM

## 2022-07-15 ENCOUNTER — Other Ambulatory Visit: Payer: Self-pay | Admitting: Internal Medicine

## 2022-07-15 DIAGNOSIS — I480 Paroxysmal atrial fibrillation: Secondary | ICD-10-CM

## 2022-07-15 NOTE — Telephone Encounter (Signed)
Prescription refill request for Eliquis received. Indication:afib Last office visit:10/23 Scr:0.5 12/23 Age: 77 Weight:71.2    Prescription refilled

## 2022-07-26 ENCOUNTER — Ambulatory Visit: Payer: Medicare Other | Admitting: Podiatry

## 2022-07-26 ENCOUNTER — Encounter: Payer: Self-pay | Admitting: Podiatry

## 2022-07-26 DIAGNOSIS — B351 Tinea unguium: Secondary | ICD-10-CM | POA: Diagnosis not present

## 2022-07-26 DIAGNOSIS — M79674 Pain in right toe(s): Secondary | ICD-10-CM | POA: Diagnosis not present

## 2022-07-26 DIAGNOSIS — D6869 Other thrombophilia: Secondary | ICD-10-CM | POA: Diagnosis not present

## 2022-07-26 DIAGNOSIS — M722 Plantar fascial fibromatosis: Secondary | ICD-10-CM

## 2022-07-26 DIAGNOSIS — M79675 Pain in left toe(s): Secondary | ICD-10-CM

## 2022-07-26 MED ORDER — DEXAMETHASONE SODIUM PHOSPHATE 120 MG/30ML IJ SOLN
4.0000 mg | Freq: Once | INTRAMUSCULAR | Status: AC
  Administered 2022-07-26: 4 mg via INTRA_ARTICULAR

## 2022-07-26 NOTE — Patient Instructions (Signed)

## 2022-07-26 NOTE — Progress Notes (Signed)
  Subjective:  Patient ID: Madison Nunez, female    DOB: Jul 31, 1945,   MRN: [id number]  Chief Complaint  Patient presents with   Nail Problem    Nail fungus     77 y.o. female presents for concern of thickened elongated and painful nails that are difficult to trim. Requesting to have them trimmed today. Patient is on eliquis and at risk for nail care. She also relates left heel pain that has been going on for several weeks. Relates pain first steps in the morning. Has been trying to stretch and avoids oral medications  PCP:  Tracey Harries, MD    . Denies any other pedal complaints. Denies n/v/f/c.   Past Medical History:  Diagnosis Date   Arthritis    hands and lower back   Atrial fibrillation [facility])    Per patient   Breast cancer [facility]) 2005   right lumpectomy, chemotherapy with A/C combo  & Taxol, mammogram 02/06/11 without recurrence   Dyspnea    Echocardiogram 02/20/11: EF 60-65%.;  ETT-myoveiw 12/12 normal with EF 80%   Hypertension    Hypothyroidism    Personal history of chemotherapy 10/2003   Personal history of radiation therapy 03/2004    Objective:  Physical Exam: Vascular: DP/PT pulses 2/4 bilateral. CFT <3 seconds. Normal hair growth on digits. No edema.  Skin. No lacerations or abrasions bilateral feet. Nails 1-5 bilateral are thickened elongated and with subungual debris.  Musculoskeletal: MMT 5/5 bilateral lower extremities in DF, PF, Inversion and Eversion. Deceased ROM in DF of ankle joint. Tender to left medial calcaneal tubercle. No pain to achilles PT or arch. No pain with calcaneal squeeze.  Neurological: Sensation intact to light touch.   Assessment:   1. Plantar fasciitis of left foot   2. Pain due to onychomycosis of toenails of both feet   3. Secondary hypercoagulable state (HCC)      Plan:  Patient was evaluated and treated and all questions answered. Discussed plantar fasciitis with patient.  X-rays reviewed and discussed with patient. No acute  fractures or dislocations noted. Mild spurring noted at inferior calcaneus.  Discussed treatment options including, ice, NSAIDS, supportive shoes, bracing, and stretching. Stretching exercises provided to be done on a daily basis.   Patient requesting injection today. Procedure note below.   Pf brace dispensed.  -Discussed and educated patient on diabetic foot care, especially with  regards to the vascular, neurological and musculoskeletal systems.  -Stressed the importance of good glycemic control and the detriment of not  controlling glucose levels in relation to the foot. -Discussed supportive shoes at all times and checking feet regularly.  -Mechanically debrided all nails 1-5 bilateral using sterile nail nipper and filed with dremel without incident  Follow-up 6 weeks or sooner if any problems arise. In the meantime, encouraged to call the office with any questions, concerns, change in symptoms.   Procedure:  Discussed etiology, pathology, conservative vs. surgical therapies. At this time a plantar fascial injection was recommended.  The patient agreed and a sterile skin prep was applied.  An injection consisting of  dexamethasone and marcaine mixture was infiltrated at the point of maximal tenderness on the left Heel.  Bandaid applied. The patient tolerated this well and was given instructions for aftercare.     Louann Sjogren, DPM

## 2022-08-29 ENCOUNTER — Ambulatory Visit (HOSPITAL_COMMUNITY)
Admission: RE | Admit: 2022-08-29 | Discharge: 2022-08-29 | Disposition: A | Discharge status: Home / Self Care | Payer: Medicare Other | Source: Ambulatory Visit | Attending: Physician Assistant | Admitting: Physician Assistant

## 2022-08-29 VITALS — BP 122/68 | HR 68 | Ht 64.5 in | Wt 154.6 lb

## 2022-08-29 DIAGNOSIS — I10 Essential (primary) hypertension: Secondary | ICD-10-CM | POA: Insufficient documentation

## 2022-08-29 DIAGNOSIS — G4733 Obstructive sleep apnea (adult) (pediatric): Secondary | ICD-10-CM | POA: Diagnosis not present

## 2022-08-29 DIAGNOSIS — Z7901 Long term (current) use of anticoagulants: Secondary | ICD-10-CM | POA: Insufficient documentation

## 2022-08-29 DIAGNOSIS — D6869 Other thrombophilia: Secondary | ICD-10-CM | POA: Diagnosis not present

## 2022-08-29 DIAGNOSIS — I480 Paroxysmal atrial fibrillation: Secondary | ICD-10-CM | POA: Insufficient documentation

## 2022-08-29 NOTE — Progress Notes (Signed)
Primary Care Physician: Tracey Harries, MD Primary Cardiologist: Dr Rennis Golden Primary Electrophysiologist: none Referring Physician: Dr Hurshel Party is a 77 y.o. female with a history of HTN, OSA, hypothyroidism, breast cancer, and atrial fibrillation who presents for follow up in the [facility]. The patient was initially diagnosed with atrial fibrillation 04/15/20 after presenting to the ED with symptoms of palpitations. ECG showed afib with RVR. She converted back to SR in the ED. Patient is on Eliquis for a CHADS2VASC score of 3. She does report that she has had palpitations for years but had never been diagnosed with afib. Patient did not tolerate daily diltiazem due to bradycardia and dizziness. She was started on PRN diltiazem.   On follow up today, patient reports that she has done well since her last visit. She did have two episodes of afib lasting 3 and 7 hours respectively. These occurred right after getting a steroid shot in her foot for plantar fascitis. She took her PRN diltiazem with good result. No bleeding issues on anticoagulation.    Today, she denies symptoms of palpitations, chest pain, shortness of breath, orthopnea, PND, lower extremity edema, presyncope, syncope, bleeding, or neurologic sequela. The patient is tolerating medications without difficulties and is otherwise without complaint today.    Atrial Fibrillation Risk Factors:  she does have symptoms or diagnosis of sleep apnea. She is compliant with CPAP therapy.  she does not have a history of rheumatic fever. she does have a history of alcohol use. The patient does not have a history of early familial atrial fibrillation or other arrhythmias.   Atrial Fibrillation Management history:  Previous antiarrhythmic drugs: none Previous cardioversions: none Previous ablations: none Anticoagulation history: Eliquis   Past Medical History:  Diagnosis Date   Arthritis    hands  and lower back   Atrial fibrillation [facility])    Per patient   Breast cancer (HCC) 2005   right lumpectomy, chemotherapy with A/C combo  & Taxol, mammogram 02/06/11 without recurrence   Dyspnea    Echocardiogram 02/20/11: EF 60-65%.;  ETT-myoveiw 12/12 normal with EF 80%   Hypertension    Hypothyroidism    Personal history of chemotherapy 10/2003   Personal history of radiation therapy 03/2004    ROS- All systems are reviewed and negative except as per the HPI above.  Physical Exam: Vitals:   08/29/22 1052  BP: 122/68  Pulse: 68  Weight: 70.1 kg  Height: 5' 4.5" (1.638 m)     GEN: Well nourished, well developed in no acute distress NECK: No JVD; No carotid bruits CARDIAC: Regular rate and rhythm, no murmurs, rubs, gallops RESPIRATORY:  Clear to auscultation without rales, wheezing or rhonchi  ABDOMEN: Soft, non-tender, non-distended EXTREMITIES:  No edema; No deformity    Wt Readings from Last 3 Encounters:  08/29/22 70.1 kg  12/07/21 71.2 kg  09/18/21 71.4 kg    EKG today demonstrates  SR, LAFB HR 68 bpm PR 160 ms QRS 108 ms QT/QTc 402/427 ms  Echo 05/23/20 demonstrated  1. Left ventricular ejection fraction, by estimation, is 55 to 60%. The  left ventricle has normal function. The left ventricle has no regional  wall motion abnormalities. There is mild left ventricular hypertrophy of  the basal-septal segment. Left ventricular diastolic parameters are consistent with Grade I diastolic dysfunction (impaired relaxation).   2. Right ventricular systolic function is normal. The right ventricular  size is normal. There is normal pulmonary artery systolic pressure.  3. Left atrial size was mildly dilated.   4. The mitral valve is normal in structure. Trivial mitral valve  regurgitation. No evidence of mitral stenosis.   5. The aortic valve is tricuspid. Aortic valve regurgitation is not  visualized. No aortic stenosis is present.   6. The inferior vena cava is normal  in size with greater than 50%  respiratory variability, suggesting right atrial pressure of 3 mmHg.   Epic records are reviewed at length today  CHA2DS2-VASc Score = 4  The patient's score is based upon: CHF History: 0 HTN History: 1 Diabetes History: 0 Stroke History: 0 Vascular Disease History: 0 Age Score: 2 Gender Score: 1        ASSESSMENT AND PLAN: Paroxysmal Atrial Fibrillation (ICD10:  I48.0) The patient's CHA2DS2-VASc score is 4, indicating a 4.8% annual risk of stroke.   Suspect recent episodes related to steroid.  Would avoid class IC with inc RBBB and LAFB. Could consider Multaq, dofetilide, or ablation if her afib becomes more frequent/persistent. Continue Eliquis 5 mg BID Continue diltiazem 30 mg PRN q 4 hours for heart racing.   Secondary Hypercoagulable State (ICD10:  D68.69) The patient is at significant risk for stroke/thromboembolism based upon her CHA2DS2-VASc Score of 4.  Continue Apixaban (Eliquis).   HTN Stable on current regimen  OSA  Encouraged nightly CPAP Followed by Dr Tresa Endo   Follow up in the AF clinic in one year.    Jorja Loa PA-C Afib Clinic [facility] [address] [place], [place] [address] [phone] 08/29/2022 11:15 AM

## 2022-09-09 ENCOUNTER — Encounter: Payer: Self-pay | Admitting: Podiatry

## 2022-09-09 ENCOUNTER — Encounter: Payer: Self-pay | Admitting: Neurology

## 2022-09-09 ENCOUNTER — Ambulatory Visit: Payer: Medicare Other | Admitting: Podiatry

## 2022-09-09 DIAGNOSIS — M722 Plantar fascial fibromatosis: Secondary | ICD-10-CM | POA: Diagnosis not present

## 2022-09-09 DIAGNOSIS — G629 Polyneuropathy, unspecified: Secondary | ICD-10-CM | POA: Diagnosis not present

## 2022-09-09 NOTE — Progress Notes (Signed)
  Subjective:  Patient ID: Madison Nunez, female    DOB: 03-24-1945,   MRN: [id number]  Chief Complaint  Patient presents with   Plantar Fasciitis    Left foot PF , patient states she is doing much better states the injection was helpful     77 y.o. female presents for follow-up of left plantar fasciitis relates doing well and injection was helpful. Relates no pain currently.  Has been trying to stretch and avoids oral medications. Relates she is concerned about possible neuropathy and relates numbness through her foot and up her leg more so on the left than the right   PCP:  Tracey Harries, MD    . Denies any other pedal complaints. Denies n/v/f/c.   Past Medical History:  Diagnosis Date   Arthritis    hands and lower back   Atrial fibrillation [facility])    Per patient   Breast cancer [facility]) 2005   right lumpectomy, chemotherapy with A/C combo  & Taxol, mammogram 02/06/11 without recurrence   Dyspnea    Echocardiogram 02/20/11: EF 60-65%.;  ETT-myoveiw 12/12 normal with EF 80%   Hypertension    Hypothyroidism    Personal history of chemotherapy 10/2003   Personal history of radiation therapy 03/2004    Objective:  Physical Exam: Vascular: DP/PT pulses 2/4 bilateral. CFT <3 seconds. Normal hair growth on digits. No edema.  Skin. No lacerations or abrasions bilateral feet. Nails 1-5 bilateral are thickened elongated and with subungual debris.  Musculoskeletal: MMT 5/5 bilateral lower extremities in DF, PF, Inversion and Eversion. Deceased ROM in DF of ankle joint. Non tender to left medial calcaneal tubercle. No pain to achilles PT or arch. No pain with calcaneal squeeze.  Neurological: Sensation intact to light touch. Negative tinels.   Assessment:   1. Plantar fasciitis of left foot   2. Neuropathy       Plan:  Patient was evaluated and treated and all questions answered. Discussed plantar fasciitis with patient.  X-rays reviewed and discussed with patient. No acute fractures  or dislocations noted. Mild spurring noted at inferior calcaneus.  Discussed treatment options including, ice, NSAIDS, supportive shoes, bracing, and stretching.  Continue stretching and brace. . Discussed neuropathy and etiology as well as treatment with patient.  -Discussed and educated patient on foot care, especially with  regards to the vascular, neurological and musculoskeletal systems.  -Discussed supportive shoes at all times and checking feet regularly.  -Amb ref to neurology for possible NCV/EMG to evaluate for neuropathy vs neuritis.  Follow-up  6 weeks for rfc.      Louann Sjogren, DPM

## 2022-09-25 ENCOUNTER — Encounter: Payer: Self-pay | Admitting: Cardiovascular Disease

## 2022-09-25 ENCOUNTER — Ambulatory Visit: Payer: Medicare Other | Attending: Cardiovascular Disease | Admitting: Cardiovascular Disease

## 2022-09-25 VITALS — BP 151/82 | HR 71 | Ht 64.0 in | Wt 153.6 lb

## 2022-09-25 DIAGNOSIS — G4733 Obstructive sleep apnea (adult) (pediatric): Secondary | ICD-10-CM

## 2022-09-25 DIAGNOSIS — I480 Paroxysmal atrial fibrillation: Secondary | ICD-10-CM | POA: Diagnosis not present

## 2022-09-25 DIAGNOSIS — E785 Hyperlipidemia, unspecified: Secondary | ICD-10-CM

## 2022-09-25 DIAGNOSIS — I10 Essential (primary) hypertension: Secondary | ICD-10-CM

## 2022-09-25 DIAGNOSIS — D6869 Other thrombophilia: Secondary | ICD-10-CM

## 2022-09-25 DIAGNOSIS — E039 Hypothyroidism, unspecified: Secondary | ICD-10-CM

## 2022-09-25 NOTE — Progress Notes (Signed)
Cardiology Office Note    Date:  09/25/2022   ID:  SHALOM WARE, DOB 1945-07-29, MRN [id number]  PCP:  Tracey Harries, MD  Cardiologist:  Nicki Guadalajara, MD(sleep); Dr. Rennis Golden   One year F/U sleep evaluation initially referred by Dr. Zoila Shutter following initiation of CPAP therapy.   History of Present Illness:  Madison Nunez is a 77 y.o. female who is followed by Dr. Rennis Golden and atrial fibrillation clinic for cardiology care.  Patient has a past history notable for breast CA in remission, family history for CAD, mitral valve prolapse, hypertension with previous labile component,  hyperlipidemia, and anxiety.  She had developed atrial fibrillation and has also been evaluated in the atrial fibrillation clinic.   I saw her for my initial sleep consultation on September 18, 2021.  Due to concerns for obstructive sleep apnea she initially was referred for a home sleep study on August 28, 2020 which revealed at least moderate overall sleep apnea with an AHI of 17.6/h and O2 nadir of 81%.  She underwent a CPAP titration study in the lab on September 19, 2020.  She had difficulty initiating sleep and was restless throughout the night.  CPAP was titrated up to 14 cm of water.  REM sleep was not achieved during the study.  At 14 cm, AHI was still elevated at 9.6/h with RDI 48.1/h and O2 nadir at 92%.  It was recommended that she initiate CPAP auto therapy with an EPR of 3 at a range of 14 to 20 cm of water with heated humidification.  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  An initial download was obtained from April 26 through Jul 03, 2021 when her pressure was at a range of 14 to 20 cm.  Usage was suboptimal with only 4 of 7 days use with average use at 1 hour and 36 minutes.  AHI however was excellent and her 95th  percentile pressure was 11.8 with maximum pressure of 12.2.  She felt the initial pressure was high which led to reduction in her start pressure down to 8 with a maximum pressure of 20.  Subsequent download from June 17 through September 16, 2021 now shows that she is meeting compliance standards with 100% use and usage greater than 4 hours at 97%.  Usage is still suboptimal, however at only 5 hours and 16 minutes.  She initially started with an ResMed N30 mask and required a chinstrap for oral venting.  She also has a ResMed air fit F30 and F20 mask.  Since initiating CPAP therapy, she feels improved with reference to her sleep.  She is unaware of breakthrough snoring.  Her sleep is more restorative.  She has had issues with development of a facial rash.  She is unaware of any recurrent episodes of atrial fibrillation over the past year.  An Epworth Sleepiness Scale score was calculated the office today and this endorsed at 10 as shown below:  Epworth Sleepiness Scale: Situation   Chance of Dozing/Sleeping (0 = never , 1 = slight chance , 2 = moderate chance , 3 = high chance )   sitting and reading 2   watching TV 3   sitting inactive in a public place 0   being a passenger in a motor vehicle for an hour  or more 1   lying down in the afternoon 3   sitting and talking to someone 0   sitting quietly after lunch (no alcohol) 1   while stopped for a few minutes in traffic as the driver 0   Total Score  10    She is unaware of any bruxism, painful restless legs, hypnopompic or hypnagogic hallucinations or cataplectic events.  She is on lisinopril 40 mg for hypertension and has a prescription for diltiazem to take as needed if she develops increased heart rates over 100.  She is anticoagulated on Eliquis 5 mg twice a day.  She has hypothyroidism on levothyroxine 50 mcg and is on low-dose pravastatin for hyperlipidemia.  During her initial sleep evaluation I had an extensive discussion with her regarding  sleep apnea and potential adverse cardiovascular consequences.  At that time, she had mild residual sleepiness most likely due to suboptimal sleep duration.  Mild adjustments were made in her CPAP device.  Since I last saw her, she has continued to do well and now uses CPAP for the entire nights duration.  I obtained a download from June 24 through September 24, 2022 and uses 100%.  Average use is 6 hours and 30 minutes.  Her ResMed air sense 11 AutoSet CPAP unit is set at a pressure range of 9 to 16 cm of water.  AHI is excellent at 1.8.  Her 95th percentile pressure is 12.6 with maximum average pressure of 13.8.  She denies any residual daytime sleepiness.  She is on lisinopril 40 mg daily for hypertension and takes diltiazem 30 mg as needed if heart rate exceeds 100.  She is on pravastatin 10 mg for hyperlipidemia.  She is anticoagulated on Eliquis.  She presents for yearly evaluation.   Past Medical History:  Diagnosis Date   Arthritis    hands and lower back   Atrial fibrillation [facility])    Per patient   Breast cancer [facility]) 2005   right lumpectomy, chemotherapy with A/C combo  & Taxol, mammogram 02/06/11 without recurrence   Dyspnea    Echocardiogram 02/20/11: EF 60-65%.;  ETT-myoveiw 12/12 normal with EF 80%   Hypertension    Hypothyroidism    Personal history of chemotherapy 10/2003   Personal history of radiation therapy 03/2004    Past Surgical History:  Procedure Laterality Date   APPENDECTOMY  1955   BREAST BIOPSY  08/29/2003   malignant   BREAST LUMPECTOMY  2005   right   TONSILLECTOMY     "as a child"    Current Medications: Outpatient Medications Prior to Visit  Medication Sig Dispense Refill   apixaban (ELIQUIS) 5 MG TABS tablet TAKE 1 TABLET(5 MG) BY MOUTH TWICE DAILY 60 tablet 5   ascorbic Acid (VITAMIN C) 500 MG CPCR Take 500 mg by mouth daily.     B Complex Vitamins (VITAMIN B COMPLEX) TABS Take 0.5-1 tablets by mouth in the morning.     Cholecalciferol (VITAMIN D3)  50 MCG (2000 UT) TABS Take 2,000 Units by mouth daily.     diltiazem (CARDIZEM) 30 MG tablet Take 1 tablet every 4 hours as needed for heart rates over 100 15 tablet 1   Flaxseed Oil OIL Take 1,300 mg by mouth in the morning and at bedtime.     latanoprost (XALATAN) 0.005 % ophthalmic solution Place 1 drop into both eyes at bedtime.     levothyroxine (SYNTHROID, LEVOTHROID) 50 MCG tablet Take 50 mcg by mouth daily.  lisinopril (PRINIVIL,ZESTRIL) 40 MG tablet Take 1 tablet (40 mg total) by mouth daily. 30 tablet 0   Multiple Vitamins-Minerals (CENTRUM SILVER 50+WOMEN) TABS Take 1 tablet by mouth daily with breakfast.     pravastatin (PRAVACHOL) 10 MG tablet Take 10 mg by mouth daily.     No facility-administered medications prior to visit.     Allergies:   Hctz [hydrochlorothiazide], Beta adrenergic blockers, Prednisone, and Carvedilol   Social History   Socioeconomic History   Marital status: Divorced    Spouse name: Not on file   Number of children: Not on file   Years of education: Not on file   Highest education level: Not on file  Occupational History   Not on file  Tobacco Use   Smoking status: Former    Current packs/day: 1.00    Average packs/day: 1 pack/day for 5.0 years (5.0 ttl pk-yrs)    Types: Cigarettes   Smokeless tobacco: Never   Tobacco comments:    Former smoker 02/27/21  Substance and Sexual Activity   Alcohol use: Not Currently    Alcohol/week: 7.0 standard drinks of alcohol    Types: 7 Glasses of wine per week   Drug use: No   Sexual activity: Never  Other Topics Concern   Not on file  Social History Narrative   Lives alone.  Drives.     Social Determinants of Health   Financial Resource Strain: Low Risk  (07/03/2022)   Received from [facility], [facility]   Overall Financial Resource Strain (CARDIA)    Difficulty of Paying Living Expenses: Not hard at all  Food Insecurity: No Food Insecurity (07/03/2022)   Received from [facility], [facility]   Hunger Vital Sign    Worried About Running Out of Food in the Last Year: Never true    Ran Out of Food in the Last Year: Never true  Transportation Needs: No Transportation Needs (07/03/2022)   Received from [facility], [facility]   PRAPARE - Transportation    Lack of Transportation (Medical): No    Lack of Transportation (Non-Medical): No  Physical Activity: Insufficiently Active (07/03/2022)   Received from [facility], [facility]   Exercise Vital Sign    Days of Exercise per Week: 3 days    Minutes of Exercise per Session: 30 min  Stress: No Stress Concern Present (07/03/2022)   Received from [facility], [facility] - Occupational Stress Questionnaire    Feeling of Stress : Only a little  Social Connections: Moderately Integrated (07/03/2022)   Received from [facility], [facility]   Social Network    How would you rate your social network (family, work, friends)?: Adequate participation with social networks    Social history is notable and that she was born in [place].  She is divorced and does not have children.  She retired 3 years ago from the [employer].  Family History:  The patient's family history includes Breast cancer in her maternal grandmother; Coronary artery disease in her father; Coronary artery disease (age of onset: 38) in her brother; Diabetes in her father.  Both parents are deceased.  She had 1 brother who is also deceased  ROS General: Negative; No fevers, chills, or night sweats;  HEENT: Negative; No changes in vision or hearing, sinus congestion, difficulty swallowing Pulmonary: Negative; No cough, wheezing, shortness of breath, hemoptysis Cardiovascular: History of PAF, GI: Negative; No nausea, vomiting, diarrhea, or abdominal pain  GU: Negative; No dysuria, hematuria, or difficulty voiding Musculoskeletal: Negative; no myalgias, joint pain, or weakness Hematologic/Oncology:  History of breast cancer in remission Endocrine: Negative; no heat/cold intolerance; no diabetes Neuro: Negative; no changes in balance, headaches Skin: Facial rash exacerbated by her mask Psychiatric: Positive for anxiety Sleep: See HPI Other comprehensive 14 point system review is negative.   PHYSICAL EXAM:   VS:  BP (!) 151/82 (BP Location: Right Arm, Patient Position: Sitting, Cuff Size: Normal)   Pulse 71   Ht 5\' 4"  (1.626 m)   Wt 153 lb 9.6 oz (69.7 kg)   LMP 02/19/2011   SpO2 96%   BMI 26.37 kg/m     Repeat blood pressure by me was 136/78.  Wt Readings from Last 3 Encounters:  09/25/22 153 lb 9.6 oz (69.7 kg)  08/29/22 154 lb 9.6 oz (70.1 kg)  12/07/21 157 lb (71.2 kg)    General: Alert, oriented, no distress.  Skin: normal turgor, no rashes, warm and dry; resolution of remote rash HEENT: Normocephalic, atraumatic. Pupils equal round and reactive to light; sclera anicteric; extraocular muscles intact;  Nose without nasal septal hypertrophy Mouth/Parynx benign; Mallinpatti scale 3 Neck: No JVD, no carotid bruits; normal carotid upstroke Lungs: clear to ausculatation and percussion; no wheezing or rales Chest wall: without tenderness to palpitation Heart: PMI not displaced, RRR, s1 s2 normal, 1/6 systolic murmur, no diastolic murmur, no rubs, gallops, thrills, or heaves Abdomen: soft, nontender; no hepatosplenomehaly, BS+; abdominal aorta nontender and not dilated by palpation. Back: no CVA tenderness Pulses 2+ Musculoskeletal: full range of motion, normal strength, no joint deformities Extremities: no clubbing cyanosis or edema, Homan's sign negative  Neurologic: grossly nonfocal; Cranial nerves grossly wnl Psychologic: Normal mood and affect   Studies/Labs Reviewed:   EKG Interpretation Date/Time:  Wednesday September 25 2022 11:19:44 EDT Ventricular Rate:  71 PR Interval:  164 QRS Duration:  104 QT Interval:  404 QTC Calculation: 439 R Axis:   -48  Text  Interpretation: Normal sinus rhythm Incomplete right bundle branch block Left anterior fascicular block Moderate voltage criteria for LVH, may be normal variant ( R in aVL , Cornell product ) When compared with ECG of 29-Aug-2021 10:07, No significant change was found Confirmed by Nicki Guadalajara ([id number]) on 09/25/2022 11:53:44 AM    EKG:  EKG isnot ordered today.  I personally reviewed the ECG from August 29, 2021 which showed normal sinus rhythm with left axis deviation, incomplete right bundle branch block, and probable LVH by voltage criteria  Recent Labs:    Latest Ref Rng & Units 02/28/2021    8:47 PM 04/24/2020    2:37 PM 04/15/2020    3:06 AM  BMP  Glucose 70 - 99 mg/dL 99  97  696   BUN 8 - 23 mg/dL 6  13  15    Creatinine 0.44 - 1.00 mg/dL 2.95  2.84  1.32   Sodium 135 - 145 mmol/L 134  134  138   Potassium 3.5 - 5.1 mmol/L 3.9  4.0  3.7   Chloride 98 - 111 mmol/L 100  100  101   CO2 22 - 32 mmol/L 26  24  25    Calcium 8.9 - 10.3 mg/dL 9.5  9.1  9.4         Latest Ref Rng & Units 02/28/2021    8:47 PM 04/24/2020    2:37 PM 10/02/2013    1:07 PM  Hepatic Function  Total Protein 6.5 - 8.1 g/dL 7.5  6.8  7.8   Albumin 3.5 - 5.0 g/dL 4.0  3.7  4.1   AST 15 - 41 U/L 24  23  24    ALT 0 - 44 U/L 25  23  22    Alk Phosphatase 38 - 126 U/L 70  62  102   Total Bilirubin 0.3 - 1.2 mg/dL 0.8  0.7  0.4        Latest Ref Rng & Units 02/28/2021    8:47 PM 04/24/2020    2:37 PM 04/15/2020    3:06 AM  CBC  WBC 4.0 - 10.5 K/uL 5.6  4.6  4.8   Hemoglobin 12.0 - 15.0 g/dL 40.9  81.1  91.4   Hematocrit 36.0 - 46.0 % 44.9  42.7  44.7   Platelets 150 - 400 K/uL 248  223  234    Lab Results  Component Value Date   MCV 95.3 02/28/2021   MCV 94.9 04/24/2020   MCV 95.1 04/15/2020   Lab Results  Component Value Date   TSH 0.904 04/24/2020   No results found for: "HGBA1C"   BNP    Component Value Date/Time   BNP 19.7 03/01/2021 0828    ProBNP No results found for: "PROBNP"   Lipid  Panel  No results found for: "CHOL", "TRIG", "HDL", "CHOLHDL", "VLDL", "LDLCALC", "LDLDIRECT", "LABVLDL"   RADIOLOGY: No results found.   Additional studies/ records that were reviewed today include:   Patient Name: Madison Nunez, Madison Nunez Date: 08/28/2020 Gender: Female D.O.B: Oct 25, 1945 Age (years): 29 Referring Provider: Lisette Abu Hilty Height (inches): 65 Interpreting Physician: Nicki Guadalajara MD, ABSM Weight (lbs): 162 RPSGT: [place] BMI: 27 MRN: [id number] Neck Size: 13.50   CLINICAL INFORMATION Sleep Study Type: HST   Indication for sleep study: fatigue, PAF, non-restorative sleep   Epworth Sleepiness Score: 7   SLEEP STUDY TECHNIQUE A multi-channel overnight portable sleep study was performed. The channels recorded were: nasal airflow, thoracic respiratory movement, and oxygen saturation with a pulse oximetry. Snoring was also monitored.   MEDICATIONS apixaban (ELIQUIS) 5 MG TABS tablet ascorbic Acid (VITAMIN C) 500 MG CPCR B Complex Vitamins (VITAMIN B COMPLEX) TABS CEQUA 0.09 % SOLN Cholecalciferol (VITAMIN D3) 50 MCG (2000 UT) TABS diltiazem (CARDIZEM) 30 MG tablet Flaxseed Oil OIL latanoprost (XALATAN) 0.005 % ophthalmic solution levothyroxine (SYNTHROID, LEVOTHROID) 50 MCG tablet lisinopril (PRINIVIL,ZESTRIL) 40 MG tablet Multiple Vitamins-Minerals (CENTRUM SILVER 50+WOMEN) TABS Patient self administered medications include: N/A.   SLEEP ARCHITECTURE Patient was studied for 442.2 minutes. The sleep efficiency was 100.0 % and the patient was supine for 97.5%. The arousal index was 0.0 per hour.   RESPIRATORY PARAMETERS The overall AHI was 17.6 per hour, with a central apnea index of 0 per hour.    The oxygen nadir was 81% during sleep. Time spent < 89% was 12.1 minutes.   CARDIAC DATA Mean heart rate during sleep was 58.9 bpm.   IMPRESSIONS - Moderate obstructive sleep apnea occurred during this study (AHI  17.6/h). The severity during REM  sleep cannot be assessed on this home study. - Moderate oxygen desaturation was noted during this study (Min O2 81%). - No snoring was audible during this study.   DIAGNOSIS - Obstructive Sleep Apnea (G47.33) - Nocturnal Hypoxemia (G47.36)   RECOMMENDATIONS - Recommend a CPAP titation study to determine optimal treatment of her sleep disordered breathing. If unable to schedule an in-lab titration, initate Auto PAP with EPR of 3  at 6 - 18 cm of water. - Effort should  be made to optimize nasal and oropharyngeal patency.  - Avoid alcohol, sedatives and other CNS depressants that may worsen sleep apnea and disrupt normal sleep architecture. - Sleep hygiene should be reviewed to assess factors that may improve sleep quality. - Weight management and regular exercise should be initiated or continued. - Recommend a download in 30 days and sleep clinic evaluation after one month of therapy.     09/19/2020 CLINICAL INFORMATION The patient is referred for a CPAP titration to treat sleep apnea.   Date of HST: 08/28/2020:  AHI 17.6/h; O2 nadir 81%.   SLEEP STUDY TECHNIQUE As per the AASM Manual for the Scoring of Sleep and Associated Events v2.3 (April 2016) with a hypopnea requiring 4% desaturations.   The channels recorded and monitored were frontal, central and occipital EEG, electrooculogram (EOG), submentalis EMG (chin), nasal and oral airflow, thoracic and abdominal wall motion, anterior tibialis EMG, snore microphone, electrocardiogram, and pulse oximetry. Continuous positive airway pressure (CPAP) was initiated at the beginning of the study and titrated to treat sleep-disordered breathing.   MEDICATIONS apixaban (ELIQUIS) 5 MG TABS tablet ascorbic Acid (VITAMIN C) 500 MG CPCR B Complex Vitamins (VITAMIN B COMPLEX) TABS CEQUA 0.09 % SOLN Cholecalciferol (VITAMIN D3) 50 MCG (2000 UT) TABS diltiazem (CARDIZEM) 30 MG tablet Flaxseed Oil OIL latanoprost (XALATAN) 0.005 % ophthalmic  solution levothyroxine (SYNTHROID, LEVOTHROID) 50 MCG tablet lisinopril (PRINIVIL,ZESTRIL) 40 MG tablet Multiple Vitamins-Minerals (CENTRUM SILVER 50+WOMEN) TABS TURMERIC PO Medications self-administered by patient taken the night of the study : N/A   TECHNICIAN COMMENTS Comments added by technician: Patient had difficulty initiating sleep. Patient was restless all through the night. Patient could not tolerate CPAP Comments added by scorer: N/A   RESPIRATORY PARAMETERS Optimal PAP Pressure (cm):  14        AHI at Optimal Pressure (/hr):            9.6 Overall Minimal O2 (%):         84.0     Supine % at Optimal Pressure (%):    100 Minimal O2 at Optimal Pressure (%): 92.0        SLEEP ARCHITECTURE The study was initiated at 9:45:30 PM and ended at 4:30:49 AM.   Sleep onset time was 29.7 minutes and the sleep efficiency was 25.2%%. The total sleep time was 102 minutes.   The patient spent 14.2%% of the night in stage N1 sleep, 85.8%% in stage N2 sleep, 0.0%% in stage N3 and 0% in REM.Stage REM latency was N/A minutes   Wake after sleep onset was 273.7. Alpha intrusion was absent. Supine sleep was 100.00%.   CARDIAC DATA The 2 lead EKG demonstrated sinus rhythm. The mean heart rate was 64.6 beats per minute. Other EKG findings include: PVCs.   LEG MOVEMENT DATA The total Periodic Limb Movements of Sleep (PLMS) were 0. The PLMS index was 0.0. A PLMS index of <15 is considered normal in adults.   IMPRESSIONS - CPAP was initiated at 5 cm and was titrated to 14 cm of water. REM sleep was not achieved during the study.  At 14 cm AHI was 9.6/h, RDI 48.1/h, O2 nadir 92%. - Central sleep apnea was not noted during this titration (CAI = 1.8/h). - Moderate oxygen desaturations to a nadir of 84.0% at 6 cm of water. - No snoring was audible during this study with minimal noted on snoring sensor. - 2-lead EKG demonstrated: PVCs - Clinically significant periodic limb movements were not noted  during this  study. Arousals associated with PLMs were rare.   DIAGNOSIS - Obstructive Sleep Apnea (G47.33)   RECOMMENDATIONS - Recommend an initial trial of CPAP Auto therapy with EPR of 3 at 14 - 20 cm H2O with heated humidification.  A Small size Resmed Full Face Mask AirFit F20 mask was used for the titration. - Effort should be made to optimize nasal and oropharyngeal patency. - Avoid alcohol, sedatives and other CNS depressants that may worsen sleep apnea and disrupt normal sleep architecture. - Sleep hygiene should be reviewed to assess factors that may improve sleep quality. - Weight management and regular exercise should be initiated or continued. - Recommend a download in 30 days and sleep clinic evaluation after 4 weeks of therapy.  ASSESSMENT:    1. OSA (obstructive sleep apnea)    PLAN:  Ms. Amoree Newlon is a 77 year old female who is followed by Dr. Rennis Golden and has been evaluated in the atrial fibrillation clinic following an episode of atrial fibrillation.  She has a history of hypertension, remote breast CA, hypothyroidism, and has been found to have obstructive sleep apnea.  An initial home sleep study confirmed at least moderate overall sleep apnea with an AHI of 17.6 and oxygen desaturation to nadir 81%.  At her initial sleep evaluation, I thoroughly reviewed her CPAP titration study from September 19, 2020.  On that titration study, she was titrated up to 14 cm of water and did not achieve any REM sleep during the evaluation.  At 14 cm, AHI remained elevated at 9.6 with RDI 48.1.  Unfortunately, due to significant semiconductor supply chain issues, she did not receive her new CPAP ResMed AirSense 11 AutoSet unit until June 27, 2021.  On her initial download, she did not meet compliance but most a subsequent download from June 17 through September 16, 2021 confirmed compliance being met with usage at 100%.  Her initial settings were slightly reduced because initially she felt the pressure was  high and on her most recent download her pressure range has been at a range of 8 to 20 cm of water.  Her 95th percentile pressure was 11.3 with maximum average pressure 12.3 L/min.  AHI was 2.3.  Typically she goes to bed at 10 PM and wakes up between 5 and 6 AM.  She has had issues with different masks and has developed an apparent allergy to some of the mask components.  Although she was compliant, usage duration was suboptimal at only 5 hours and 16 minutes.  At her initial evaluation I discussed optimal sleep duration for an adult at 7 and 9 hours.  I discussed normal sleep architecture and potential disruptive sleep architecture secondary to untreated sleep apnea.  I discussed its effects on blood pressure control, increased risk for nocturnal arrhythmias, with significant increased risk for atrial fibrillation.  In addition I discussed its effects on insulin resistance, increased inflammatory markers leading to inflammation, as well as increased GERD.  I also discussed potential nocturnal hypoxemia contributing to nocturnal ischemia both cardiac as well as cerebrovascular.  I reviewed the pathophysiology associated with increased nocturia as a result of untreated sleep apnea.  Over the past year, she has done remarkably well.  She is now using CPAP for the nights duration.  Her most recent compliance from June 24 through September 24, 2022 shows 100% use with average use at approximately 6 hours and 30 minutes.  AHI is excellent at 1.8 with her 95th percentile pressure 12.6 and maximum average pressure at 13.8.  Her blood pressure on repeat by me was stable at 136/78.  She continues to be on anticoagulation with Eliquis.  She is maintaining sinus rhythm and has right bundle branch block.  She is on lisinopril 40 mg daily for hypertension and remains on low-dose pravastatin 10 mg for hyperlipidemia.  She is on levothyroxine 50 mcg for hypothyroidism.  She will continue current therapy.  I will see her in May 2025  for follow-up evaluation or sooner as needed.   Medication Adjustments/Labs and Tests Ordered: Current medicines are reviewed at length with the patient today.  Concerns regarding medicines are outlined above.  Medication changes, Labs and Tests ordered today are listed in the Patient Instructions below. There are no Patient Instructions on file for this visit.   Signed, Nicki Guadalajara, MD, [facility], ABSM Diplomat, American Board of Sleep Medicine  09/25/2022 11:34 AM    [facility] [address], [place], [place]  [address] Phone: [phone]

## 2022-09-25 NOTE — Patient Instructions (Signed)
Medication Instructions:  *If you need a refill on your cardiac medications before your next appointment, please call your pharmacy*   Lab Work: If you have labs (blood work) drawn today and your tests are completely normal, you will receive your results only by: MyChart Message (if you have MyChart) OR A paper copy in the mail If you have any lab test that is abnormal or we need to change your treatment, we will call you to review the results.   Testing/Procedures: NONE   Follow-Up: At [facility], you and your health needs are our priority.  As part of our continuing mission to provide you with exceptional heart care, we have created designated Provider Care Teams.  These Care Teams include your primary Cardiologist (physician) and Advanced Practice Providers (APPs -  Physician Assistants and Nurse Practitioners) who all work together to provide you with the care you need, when you need it.  We recommend signing up for the patient portal called "MyChart".  Sign up information is provided on this After Visit Summary.  MyChart is used to connect with patients for Virtual Visits (Telemedicine).  Patients are able to view lab/test results, encounter notes, upcoming appointments, etc.  Non-urgent messages can be sent to your provider as well.   To learn more about what you can do with MyChart, go to [email or web address].    Your next appointment:   10 month(s)  Provider:   DR. Nicki Guadalajara     Other Instructions A LETTER WILL BE MAILED OUT TO YOU AS A REMINDER TO CALL THE OFFICE FOR YOUR APPOINTMENT IN MAY 2025.

## 2022-09-29 ENCOUNTER — Encounter: Payer: Self-pay | Admitting: Cardiovascular Disease

## 2022-10-08 ENCOUNTER — Ambulatory Visit: Payer: Medicare Other | Admitting: Neurology

## 2022-10-08 ENCOUNTER — Encounter: Payer: Self-pay | Admitting: Neurology

## 2022-10-08 VITALS — BP 143/75 | HR 64 | Ht 64.5 in | Wt 155.0 lb

## 2022-10-08 DIAGNOSIS — R200 Anesthesia of skin: Secondary | ICD-10-CM

## 2022-10-08 DIAGNOSIS — R202 Paresthesia of skin: Secondary | ICD-10-CM | POA: Diagnosis not present

## 2022-10-08 NOTE — Patient Instructions (Signed)
Nerve testing of both lower extremities  ELECTROMYOGRAM AND NERVE CONDUCTION STUDIES (EMG/NCS) INSTRUCTIONS  How to Prepare The neurologist conducting the EMG will need to know if you have certain medical conditions. Tell the neurologist and other EMG lab personnel if you: Have a pacemaker or any other electrical medical device Take blood-thinning medications Have hemophilia, a blood-clotting disorder that causes prolonged bleeding Bathing Take a shower or bath shortly before your exam in order to remove oils from your skin. Don't apply lotions or creams before the exam.  What to Expect You'll likely be asked to change into a hospital gown for the procedure and lie down on an examination table. The following explanations can help you understand what will happen during the exam.  Electrodes. The neurologist or a technician places surface electrodes at various locations on your skin depending on where you're experiencing symptoms. Or the neurologist may insert needle electrodes at different sites depending on your symptoms.  Sensations. The electrodes will at times transmit a tiny electrical current that you may feel as a twinge or spasm. The needle electrode may cause discomfort or pain that usually ends shortly after the needle is removed. If you are concerned about discomfort or pain, you may want to talk to the neurologist about taking a short break during the exam.  Instructions. During the needle EMG, the neurologist will assess whether there is any spontaneous electrical activity when the muscle is at rest - activity that isn't present in healthy muscle tissue - and the degree of activity when you slightly contract the muscle.  He or she will give you instructions on resting and contracting a muscle at appropriate times. Depending on what muscles and nerves the neurologist is examining, he or she may ask you to change positions during the exam.  After your EMG You may experience some  temporary, minor bruising where the needle electrode was inserted into your muscle. This bruising should fade within several days. If it persists, contact your primary care doctor.

## 2022-10-08 NOTE — Progress Notes (Signed)
[facility] Neurology Division Clinic Note - Initial Visit   Date: 10/08/2022   Madison Nunez MRN: [id number] DOB: 1945/08/31   Dear Dr. Ralene Cork:  Thank you for your kind referral of Madison Nunez for consultation of bilateral feet numbness. Although her history is well known to you, please allow [place] to reiterate it for the purpose of our medical record. The patient was accompanied to the clinic by self.  Madison Nunez is a 77 y.o. right-handed female with atrial fibrillation, hypertension, hyperlipidemia, OSA, hypothyroidism, right breast cancer s/p lumpectomy, chemotherapy and radiation (2012), left plantar fasciitis presenting for evaluation of bilateral leg numbness.   IMPRESSION/PLAN: Bilateral feet numbness.  Exam shows preserved distal sensation and strength, however reflexes are diminished at the ankles.  To further evaluate her symptoms, I recommend NCS/EMG of bilateral legs.  Her history of alcohol use certainly predisposes her to developing neuropathy. If there is evidence of neuropathy on EDX, management remains supportive.    ------------------------------------------------------------- History of present illness: Starting around 2021, she began having numbness in the feet, which has slowly extended into the lower legs.  Symptoms are constant. There is no associated pain, she has minimal tingling.  No exacerbating or alleviating factors.  She endorses some imbalance. No falls.  She used to drink 2 glasses of wine daily for about 20 years.  No history of diabetes.  She received chemotherapy for breast cancer in 2012 and does not recall having neuropathy symptoms during her treatment.   Out-side paper records, electronic medical record, and images have been reviewed where available and summarized as:  Lab Results  Component Value Date   TSH 0.904 04/24/2020   No results found for: "ESRSEDRATE", "POCTSEDRATE"  Past Medical History:  Diagnosis Date   Arthritis     hands and lower back   Atrial fibrillation [facility])    Per patient   Breast cancer (HCC) 2005   right lumpectomy, chemotherapy with A/C combo  & Taxol, mammogram 02/06/11 without recurrence   Dyspnea    Echocardiogram 02/20/11: EF 60-65%.;  ETT-myoveiw 12/12 normal with EF 80%   Hypertension    Hypothyroidism    Personal history of chemotherapy 10/2003   Personal history of radiation therapy 03/2004    Past Surgical History:  Procedure Laterality Date   APPENDECTOMY  1955   BREAST BIOPSY  08/29/2003   malignant   BREAST LUMPECTOMY  2005   right   TONSILLECTOMY     "as a child"     Medications:  Outpatient Encounter Medications as of 10/08/2022  Medication Sig   apixaban (ELIQUIS) 5 MG TABS tablet TAKE 1 TABLET(5 MG) BY MOUTH TWICE DAILY   ascorbic Acid (VITAMIN C) 500 MG CPCR Take 500 mg by mouth daily.   Cholecalciferol (VITAMIN D3) 50 MCG (2000 UT) TABS Take 2,000 Units by mouth daily.   diltiazem (CARDIZEM) 30 MG tablet Take 1 tablet every 4 hours as needed for heart rates over 100   Flaxseed Oil OIL Take 1,300 mg by mouth in the morning and at bedtime.   latanoprost (XALATAN) 0.005 % ophthalmic solution Place 1 drop into both eyes at bedtime.   levothyroxine (SYNTHROID, LEVOTHROID) 50 MCG tablet Take 50 mcg by mouth daily.   lisinopril (PRINIVIL,ZESTRIL) 40 MG tablet Take 1 tablet (40 mg total) by mouth daily.   Multiple Vitamins-Minerals (CENTRUM SILVER 50+WOMEN) TABS Take 1 tablet by mouth daily with breakfast.   pravastatin (PRAVACHOL) 10 MG tablet Take 10 mg by mouth daily.  B Complex Vitamins (VITAMIN B COMPLEX) TABS Take 0.5-1 tablets by mouth in the morning. (Patient not taking: Reported on 10/08/2022)   No facility-administered encounter medications on file as of 10/08/2022.    Allergies:  Allergies  Allergen Reactions   Hctz [Hydrochlorothiazide] Other (See Comments)    Hyponatremia (leading to hospitalization)   Beta Adrenergic Blockers Nausea Only and Other (See  Comments)    Metoprolol and Carvedilol have stopped working for patient over time, causing nausea also   Prednisone Other (See Comments)    Had reaction with use prior to chemo- ONLY IN HIGHER DOSES   Carvedilol Other (See Comments)    Tolerates other beta-blockers, Tolerates other beta-blockers    Family History: Family History  Problem Relation Age of Onset   Alzheimer's disease Mother    Coronary artery disease Father        had juvenile DM most of his life, had 3 MI's the first in his early 60's, died at 84   Diabetes Father    Coronary artery disease Brother 65       died of first MI at 63   Breast cancer Maternal Grandmother     Social History: Social History   Tobacco Use   Smoking status: Former    Current packs/day: 1.00    Average packs/day: 1 pack/day for 5.0 years (5.0 ttl pk-yrs)    Types: Cigarettes   Smokeless tobacco: Never   Tobacco comments:    Former smoker 02/27/21  Substance Use Topics   Alcohol use: Not Currently    Alcohol/week: 7.0 standard drinks of alcohol    Types: 7 Glasses of wine per week    Comment: No Alcohol since 2022   Drug use: No   Social History   Social History Narrative   Lives alone.  Drives.     Are you right handed or left handed? Right Handed    Are you currently employed ? No   What is your current occupation?Retired at the age of 28 because if Covid    Do you live at home alone? Yes   Who lives with you?    What type of home do you live in: 1 story or 2 story? One story home         Vital Signs:  BP (!) 143/75   Pulse 64   Ht 5' 4.5" (1.638 m)   Wt 155 lb (70.3 kg)   LMP 02/19/2011   SpO2 94%   BMI 26.19 kg/m   Neurological Exam: MENTAL STATUS including orientation to time, place, person, recent and remote memory, attention span and concentration, language, and fund of knowledge is normal.  Speech is not dysarthric.  CRANIAL NERVES: II:  No visual field defects.     III-IV-VI: Pupils equal round and  reactive to light.  Normal conjugate, extra-ocular eye movements in all directions of gaze.  No nystagmus.  No ptosis.   V:  Normal facial sensation.    VII:  Normal facial symmetry and movements.   VIII:  Normal hearing and vestibular function.   IX-X:  Normal palatal movement.   XI:  Normal shoulder shrug and head rotation.   XII:  Normal tongue strength and range of motion, no deviation or fasciculation.  MOTOR:  No atrophy, fasciculations or abnormal movements.  No pronator drift.   Upper Extremity:  Right  Left  Deltoid  5/5   5/5   Biceps  5/5   5/5   Triceps  5/5  5/5   Wrist extensors  5/5   5/5   Wrist flexors  5/5   5/5   Finger extensors  5/5   5/5   Finger flexors  5/5   5/5   Dorsal interossei  5/5   5/5   Abductor pollicis  5/5   5/5   Tone (Ashworth scale)  0  0   Lower Extremity:  Right  Left  Hip flexors  5/5   5/5   Knee flexors  5/5   5/5   Knee extensors  5/5   5/5   Dorsiflexors  5/5   5/5   Plantarflexors  5/5   5/5   Toe extensors  5/5   5/5   Toe flexors  5/5   5/5   Tone (Ashworth scale)  0  0   MSRs:                                           Right        Left brachioradialis 2+  2+  biceps 2+  2+  triceps 2+  2+  patellar 2+  2+  ankle jerk 1+  1+  Hoffman no  no  plantar response down  down   SENSORY:  Normal and symmetric perception of light touch, pinprick, vibration, and temperature.  Romberg's sign absent.   COORDINATION/GAIT: Normal finger-to- nose-finger.  Intact rapid alternating movements bilaterally.  Able to rise from a chair without using arms.  Gait narrow based and stable. Tandem and stressed gait intact.     Thank you for allowing me to participate in patient's care.  If I can answer any additional questions, I would be pleased to do so.    Sincerely,     K. Allena Katz, DO

## 2022-10-18 ENCOUNTER — Other Ambulatory Visit: Payer: Self-pay | Admitting: Internal Medicine

## 2022-10-18 DIAGNOSIS — I480 Paroxysmal atrial fibrillation: Secondary | ICD-10-CM

## 2022-10-18 NOTE — Telephone Encounter (Signed)
Eliquis 5mg  refill request received. Patient is 77 years old, weight-70.3kg, Crea-0.59 on 02/01/22 via [employer] tab from [employer] [facility] [place], and last seen by Dr. Tresa Endo on 09/25/22. Dose is appropriate based on dosing criteria. Will send in refill to requested pharmacy.

## 2022-10-21 ENCOUNTER — Encounter: Payer: Self-pay | Admitting: Podiatry

## 2022-10-21 ENCOUNTER — Ambulatory Visit: Payer: Medicare Other | Admitting: Cardiovascular Disease

## 2022-10-21 ENCOUNTER — Ambulatory Visit: Payer: Medicare Other | Admitting: Podiatry

## 2022-10-21 DIAGNOSIS — M79675 Pain in left toe(s): Secondary | ICD-10-CM

## 2022-10-21 DIAGNOSIS — M79674 Pain in right toe(s): Secondary | ICD-10-CM

## 2022-10-21 DIAGNOSIS — D6869 Other thrombophilia: Secondary | ICD-10-CM

## 2022-10-21 DIAGNOSIS — B351 Tinea unguium: Secondary | ICD-10-CM | POA: Diagnosis not present

## 2022-10-21 DIAGNOSIS — G629 Polyneuropathy, unspecified: Secondary | ICD-10-CM

## 2022-10-21 NOTE — Progress Notes (Signed)
  Subjective:  Patient ID: Madison Nunez, female    DOB: 09-Jul-1945,   MRN: [id number]  Chief Complaint  Patient presents with   Plantar Fasciitis    Pt stated she is doing so much better, pt denies pain    77 y.o. female presents for concern of thickened elongated and painful nails that are difficult to trim. Requesting to have them trimmed today. Has follow-up NCV with neurology coming up.   PCP:  Tracey Harries, MD     PCP:  Tracey Harries, MD    . Denies any other pedal complaints. Denies n/v/f/c.   Past Medical History:  Diagnosis Date   Arthritis    hands and lower back   Atrial fibrillation [facility])    Per patient   Breast cancer [facility]) 2005   right lumpectomy, chemotherapy with A/C combo  & Taxol, mammogram 02/06/11 without recurrence   Dyspnea    Echocardiogram 02/20/11: EF 60-65%.;  ETT-myoveiw 12/12 normal with EF 80%   Hypertension    Hypothyroidism    Personal history of chemotherapy 10/2003   Personal history of radiation therapy 03/2004    Objective:  Physical Exam: Vascular: DP/PT pulses 2/4 bilateral. CFT <3 seconds. Normal hair growth on digits. No edema.  Skin. No lacerations or abrasions bilateral feet. Nails 1-5 bilateral are thickened elongated and with subungual debris.  Musculoskeletal: MMT 5/5 bilateral lower extremities in DF, PF, Inversion and Eversion. Deceased ROM in DF of ankle joint. Non tender to left medial calcaneal tubercle. No pain to achilles PT or arch. No pain with calcaneal squeeze.  Neurological: Sensation intact to light touch. Negative tinels.   Assessment:   1. Pain due to onychomycosis of toenails of both feet   2. Neuropathy   3. Secondary hypercoagulable state (HCC)        Plan:  Patient was evaluated and treated and all questions answered. -Doing well with plantar fasciitis, getting NCV with neuropathy to see if she has any neuropathy.  -Mechanically debrided all nails 1-5 bilateral using sterile nail nipper and filed with  dremel without incident  -Answered all patient questions -Patient to return  in 3 months for at risk foot care -Patient advised to call the office if any problems or questions arise in the meantime.  Follow-up  6 weeks for rfc.      Louann Sjogren, DPM

## 2022-10-25 ENCOUNTER — Ambulatory Visit: Payer: Medicare Other | Admitting: Neurology

## 2022-10-25 DIAGNOSIS — R202 Paresthesia of skin: Secondary | ICD-10-CM | POA: Diagnosis not present

## 2022-10-25 DIAGNOSIS — R200 Anesthesia of skin: Secondary | ICD-10-CM

## 2022-10-25 NOTE — Procedures (Signed)
[facility] Neurology  [address], [address]  [place], [place] [address] Tel: [phone] Fax: [phone] Test Date:  10/25/2022  Patient: Madison Nunez DOB: May 19, 1945 Physician: Nita Sickle, DO  Sex: Female Height: 5\' 4"  Ref Phys: Nita Sickle, DO  ID#: [id number]   Technician:    History: This is a 77 year old female referred for evaluation of bilateral feet numbness.  NCV & EMG Findings: Electrodiagnostic testing of the right lower extremity and additional studies of the left shows: Bilateral sural and superficial peroneal sensory responses are within normal limits. Bilateral peroneal and tibial motor responses are within normal limits. Right tibial H reflex studies are within normal limits. There is no evidence of active or chronic motor axonal changes affecting any of the tested muscles.  Motor unit configuration and recruitment pattern is within normal limits.   Impression: This is a normal study of the lower extremities.  In particular, there is no evidence of a large fiber sensorimotor polyneuropathy or lumbosacral radiculopathy.    ___________________________ Nita Sickle, DO    Nerve Conduction Studies   Stim Site NR Peak (ms) Norm Peak (ms) O-P Amp (V) Norm O-P Amp  Left Sup Peroneal Anti Sensory (Ant Lat Mall)  32 C  12 cm    3.2 <4.6 7.0 >3  Right Sup Peroneal Anti Sensory (Ant Lat Mall)  32 C  12 cm    2.4 <4.6 7.0 >3  Left Sural Anti Sensory (Lat Mall)  32 C  Calf    3.0 <4.6 7.2 >3  Right Sural Anti Sensory (Lat Mall)  32 C  Calf    2.4 <4.6 8.2 >3     Stim Site NR Onset (ms) Norm Onset (ms) O-P Amp (mV) Norm O-P Amp Site1 Site2 Delta-0 (ms) Dist (cm) Vel (m/s) Norm Vel (m/s)  Left Peroneal Motor (Ext Dig Brev)  32 C  Ankle    3.5 <6.0 3.6 >2.5 B Fib Ankle 7.1 36.0 51 >40  B Fib    10.6  3.0  Poplt B Fib 1.4 8.0 57 >40  Poplt    12.0  2.9         Right Peroneal Motor (Ext Dig Brev)  32 C  Ankle    3.4 <6.0 3.5 >2.5 B Fib Ankle 8.2  38.0 46 >40  B Fib    11.6  2.8  Poplt B Fib 1.4 8.0 57 >40  Poplt    13.0  2.6         Left Tibial Motor (Abd Hall Brev)  32 C  Ankle    4.1 <6.0 6.3 >4 Knee Ankle 9.3 39.0 42 >40  Knee    13.4  3.8         Right Tibial Motor (Abd Hall Brev)  32 C  Ankle    4.9 <6.0 14.5 >4 Knee Ankle 8.6 41.0 48 >40  Knee    13.5  8.9          Electromyography   Side Muscle Ins.Act Fibs Fasc Recrt Amp Dur Poly Activation Comment  Right AntTibialis Nml Nml Nml Nml Nml Nml Nml Nml N/A  Right Gastroc Nml Nml Nml Nml Nml Nml Nml Nml N/A  Right RectFemoris Nml Nml Nml Nml Nml Nml Nml Nml N/A  Left AntTibialis Nml Nml Nml Nml Nml Nml Nml Nml N/A  Left Gastroc Nml Nml Nml Nml Nml Nml Nml Nml N/A  Left RectFemoris Nml Nml Nml Nml Nml Nml Nml Nml N/A  Waveforms:

## 2022-11-12 ENCOUNTER — Ambulatory Visit (INDEPENDENT_AMBULATORY_CARE_PROVIDER_SITE_OTHER): Payer: Medicare Other | Admitting: Neurology

## 2022-11-12 DIAGNOSIS — R200 Anesthesia of skin: Secondary | ICD-10-CM

## 2022-11-12 DIAGNOSIS — R209 Unspecified disturbances of skin sensation: Secondary | ICD-10-CM

## 2022-11-12 DIAGNOSIS — R202 Paresthesia of skin: Secondary | ICD-10-CM | POA: Diagnosis not present

## 2022-11-12 NOTE — Progress Notes (Signed)
Punch Biopsy Procedure Note  Preprocedure Diagnosis: disturbance of skin sensation   Postprocedure Diagnosis: same  Locations: Site 1: left lateral distal leg;  Site 2: left lateral thigh;   Indications: r/o small fiber neuropathy  Anesthesia: 5 mL Lidocaine 1% with epinephrine  Procedure Details Patient informed of the risks (including but not limited to bleeding, pain, infection, scar and infection) and benefits of the procedure.  Informed consent obtained.  The areas which were chosen for biopsy, as above, and surrounding areas were given a sterile prep using alcohol and iodine. The skin was then stretched perpendicular to the skin tension lines and sample removed using the 3 mm punch. Pressure applied, hemostasis achieved.   Dressing applied. The specimen(s) was sent for pathologic examination. The patient tolerated the procedure well.  Estimated Blood Loss: 2 ml  Condition: Stable  Complications: none.  Plan: 1. Instructed to keep the wound dry and covered for 24h and clean thereafter. 2. Warning signs of infection were reviewed.    Jacquelyne Balint, MD [facility] Neurology

## 2022-11-19 ENCOUNTER — Ambulatory Visit: Payer: Medicare Other | Admitting: Cardiovascular Disease

## 2022-11-20 IMAGING — DX DG CHEST 1V PORT
1 series · 1 of 1 positions shown · non-contrast
Comparison: April 15, 2020.

CLINICAL DATA: Shortness of breath.

EXAM:
PORTABLE CHEST 1 VIEW

[chest]
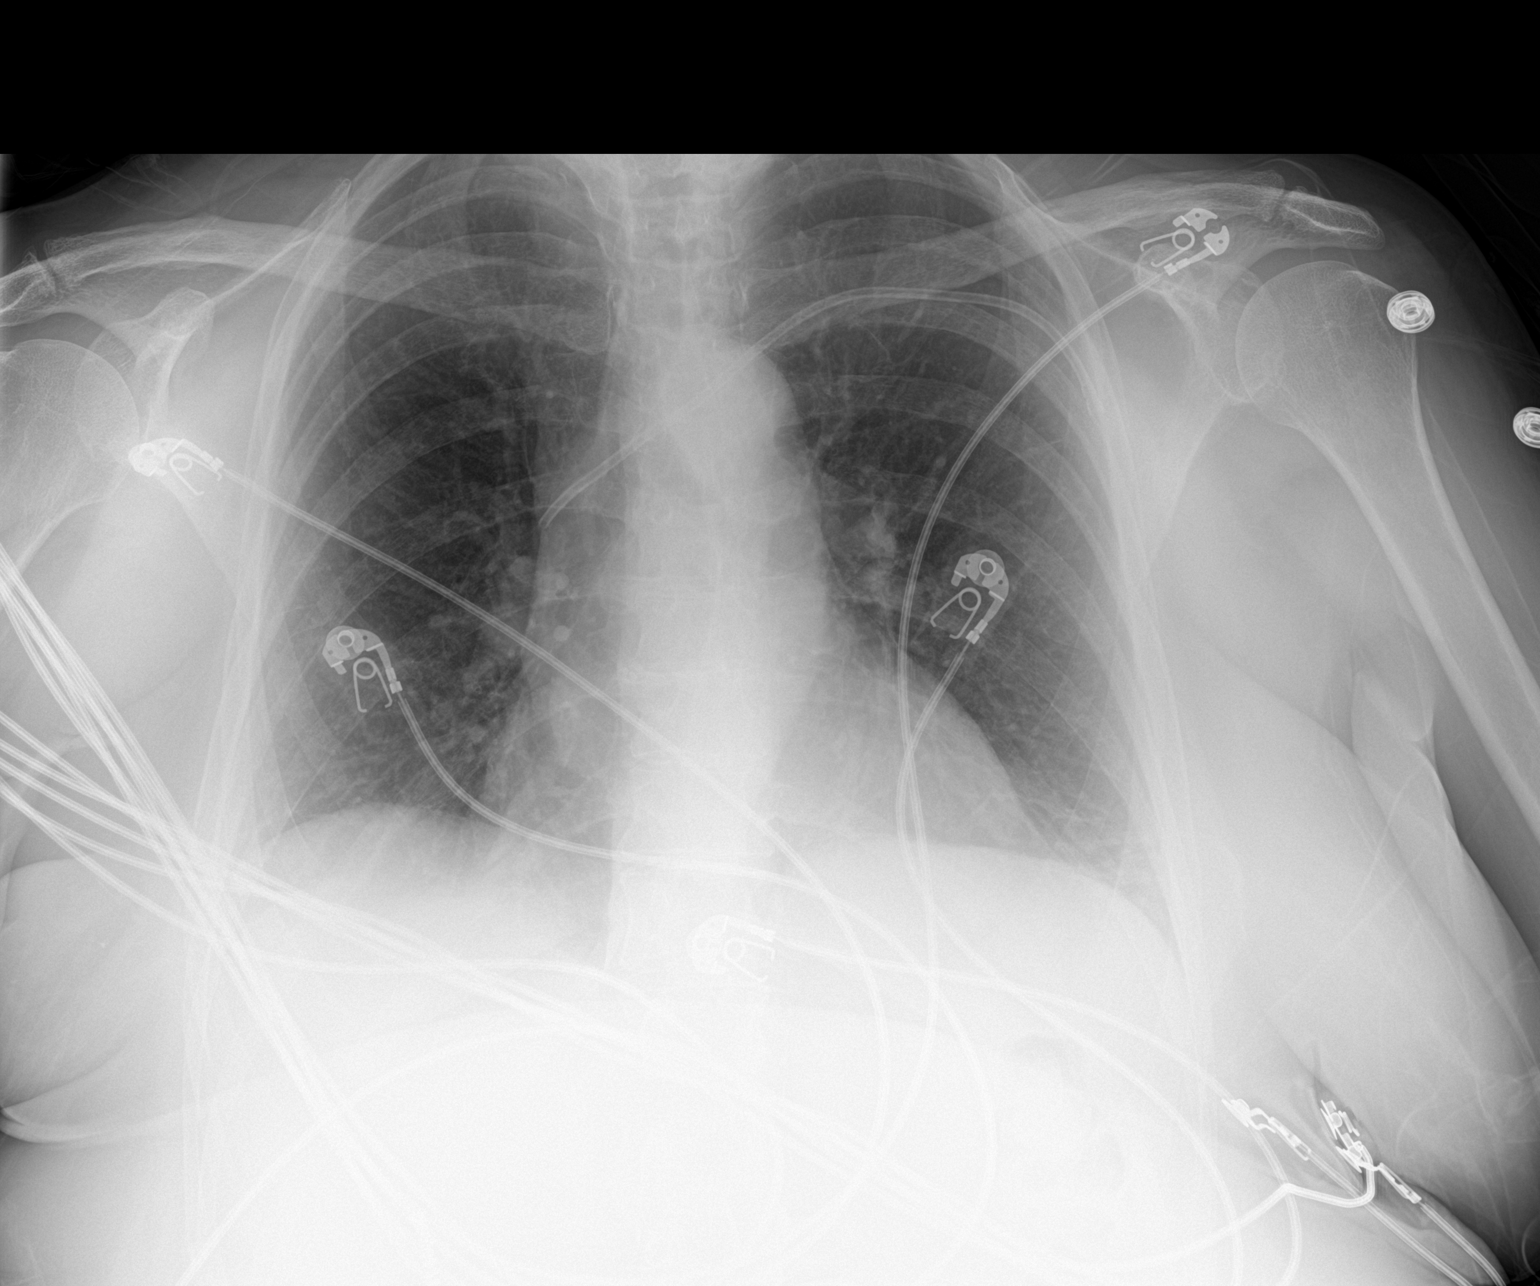

[1 of 1 positions shown; findings below may reference images not displayed]

FINDINGS: The heart size and mediastinal contours are within normal limits.
Both lungs are clear. No pneumothorax or pleural effusion is noted.
Left subclavian Port-A-Cath is unchanged in position. The visualized
skeletal structures are unremarkable.
IMPRESSION: No active disease.

## 2022-12-02 ENCOUNTER — Telehealth: Payer: Self-pay

## 2022-12-02 ENCOUNTER — Telehealth: Payer: Self-pay | Admitting: Neurology

## 2022-12-02 NOTE — Telephone Encounter (Signed)
Called patient and informed her of results of biopsy and patient was very happy and thanked [place] for the call.

## 2022-12-02 NOTE — Telephone Encounter (Signed)
Please let pt know that skin biopsy results are normal, no evidence of small fiber neuropathy.  Thanks.

## 2023-01-02 ENCOUNTER — Ambulatory Visit: Payer: Medicare Other | Attending: Internal Medicine | Admitting: Internal Medicine

## 2023-01-02 ENCOUNTER — Encounter: Payer: Self-pay | Admitting: Internal Medicine

## 2023-01-02 VITALS — BP 140/72 | HR 70 | Ht 64.5 in | Wt 149.0 lb

## 2023-01-02 DIAGNOSIS — Z01812 Encounter for preprocedural laboratory examination: Secondary | ICD-10-CM

## 2023-01-02 DIAGNOSIS — I10 Essential (primary) hypertension: Secondary | ICD-10-CM

## 2023-01-02 DIAGNOSIS — R5383 Other fatigue: Secondary | ICD-10-CM | POA: Diagnosis not present

## 2023-01-02 DIAGNOSIS — E785 Hyperlipidemia, unspecified: Secondary | ICD-10-CM | POA: Diagnosis not present

## 2023-01-02 DIAGNOSIS — G4733 Obstructive sleep apnea (adult) (pediatric): Secondary | ICD-10-CM

## 2023-01-02 DIAGNOSIS — I480 Paroxysmal atrial fibrillation: Secondary | ICD-10-CM

## 2023-01-02 MED ORDER — METOPROLOL TARTRATE 50 MG PO TABS: 50.0000 mg | ORAL_TABLET | ORAL | 0 refills | Status: DC

## 2023-01-02 NOTE — Progress Notes (Signed)
OFFICE NOTE  Chief Complaint:  Follow-up  Primary Care Physician: Tracey Harries, MD  HPI:  Madison Nunez is a 77 y.o. female with a past medial history significant for breast CA in remission (port still in place), family history of CAD, possible mitral valve prolapse, HTN, hyperlipidemia and anxiety. She presents with several days of labile hypertension, flushing and palpitations, but no complaints of frank chest pain. Troponin has been negative x 5. EKG personally reviewed shows a sinus tachy, LVH with strain, incomplete RBBB. She was previously on carvedilol but then developed flushing. Was on metoprolol, but I'm not sure that controlled her BP - also on lisinopril. Switched by PCP to amlodipine and feels she is now having the same symptoms of flushing. During her hospitalization, I advised stopping her amlodipine and starting low dose toprol. A 30 day monitor was ordered. I have personally reviewed her monitor which shows no significant arrhythmias or extrasystoles. She reports feeling well without further palpitations. She is not taking her b-blocker.  07/18/2020  Madison Nunez returns today for follow-up.  She has been seen most recently by the A. fib clinic.  She is now on Eliquis.  She was taking long-acting diltiazem but had side effects with it and felt like she was in a fog.  She is now using diltiazem as needed.  She had several episodes of atrial fibrillation but probably has less than 4 or 5 events a month.  She uses diltiazem as needed for that.  There was some discussion between her and Mr. Charlean Merl about antiarrhythmic therapy.  I mentioned that today.  She also feels that she has some fatigue.  This might be related to A. fib or other possible causes.  We discussed the possibility of sleep apnea.  She says she lives alone and is not aware that she snores but does feel tired during the day has to take a nap and oftentimes has a nonrestorative sleep at night.  12/07/2021  Madison Nunez  is seen today in follow-up.  Overall she seems to be doing well.  She did bring in a list of her blood pressure readings and noted a few readings that were a little high with associated mild elevation in heart rate to the low 100s.  She is concerned as to whether these might be related to A-fib but does not have clear recurrent palpitations.  She has been followed in the A-fib clinic and has been maintaining sinus rhythm.  EKG today again shows sinus rhythm.  She is using a as needed diltiazem strategy due to underlying bifascicular block.  Blood pressure today was well controlled.  She denies any chest pain or shortness of breath.  01/02/2023  Madison Nunez is seen today in follow-up.  She denies any significant recurrent palpitations.  EKG today shows no A-fib.  She has no awareness of this.  She was recently taken off of pravastatin and switch to 10 mg atorvastatin by her PCP.  Apparently she had some side effects but not clearly myalgias.  She was hesitant to take higher doses of statins.  Looking into this further and appears that her cholesterol was quite high in September with total 264, triglycerides 78, HDL 54 and LDL 178.  She also has a history of high LDL cholesterol.  Additionally there is heart disease in her family.  She was noted on imaging in 2022 to have aortic atherosclerosis but has not had any prior coronary evaluation.  After discussing this she noted  that she has been having some progressive fatigue and exercise intolerance with exertion.  PMHx:  Past Medical History:  Diagnosis Date   Arthritis    hands and lower back   Atrial fibrillation [facility])    Per patient   Breast cancer (HCC) 2005   right lumpectomy, chemotherapy with A/C combo  & Taxol, mammogram 02/06/11 without recurrence   Dyspnea    Echocardiogram 02/20/11: EF 60-65%.;  ETT-myoveiw 12/12 normal with EF 80%   Hypertension    Hypothyroidism    Personal history of chemotherapy 10/2003   Personal history of radiation  therapy 03/2004    Past Surgical History:  Procedure Laterality Date   APPENDECTOMY  1955   BREAST BIOPSY  08/29/2003   malignant   BREAST LUMPECTOMY  2005   right   TONSILLECTOMY     "as a child"    FAMHx:  Family History  Problem Relation Age of Onset   Alzheimer's disease Mother    Coronary artery disease Father        had juvenile DM most of his life, had 3 MI's the first in his early 70's, died at 75   Diabetes Father    Coronary artery disease Brother 18       died of first MI at 64   Breast cancer Maternal Grandmother     SOCHx:   reports that she has quit smoking. Her smoking use included cigarettes. She has a 5 pack-year smoking history. She has never used smokeless tobacco. She reports that she does not currently use alcohol after a past usage of about 7.0 standard drinks of alcohol per week. She reports that she does not use drugs.  ALLERGIES:  Allergies  Allergen Reactions   Hctz [Hydrochlorothiazide] Other (See Comments)    Hyponatremia (leading to hospitalization)   Beta Adrenergic Blockers Nausea Only and Other (See Comments)    Metoprolol and Carvedilol have stopped working for patient over time, causing nausea also   Prednisone Other (See Comments)    Had reaction with use prior to chemo- ONLY IN HIGHER DOSES   Carvedilol Other (See Comments)    Tolerates other beta-blockers, Tolerates other beta-blockers    ROS: Pertinent items noted in HPI and remainder of comprehensive ROS otherwise negative.  HOME MEDS: Current Outpatient Medications on File Prior to Visit  Medication Sig Dispense Refill   ascorbic Acid (VITAMIN C) 500 MG CPCR Take 500 mg by mouth daily.     B Complex Vitamins (VITAMIN B COMPLEX) TABS Take 0.5-1 tablets by mouth in the morning.     Cholecalciferol (VITAMIN D3) 50 MCG (2000 UT) TABS Take 2,000 Units by mouth daily.     diltiazem (CARDIZEM) 30 MG tablet Take 1 tablet every 4 hours as needed for heart rates over 100 15 tablet 1    ELIQUIS 5 MG TABS tablet TAKE 1 TABLET(5 MG) BY MOUTH TWICE DAILY 60 tablet 5   Flaxseed Oil OIL Take 1,300 mg by mouth in the morning and at bedtime.     latanoprost (XALATAN) 0.005 % ophthalmic solution Place 1 drop into both eyes at bedtime.     levothyroxine (SYNTHROID, LEVOTHROID) 50 MCG tablet Take 50 mcg by mouth daily.     lisinopril (PRINIVIL,ZESTRIL) 40 MG tablet Take 1 tablet (40 mg total) by mouth daily. 30 tablet 0   Multiple Vitamins-Minerals (CENTRUM SILVER 50+WOMEN) TABS Take 1 tablet by mouth daily with breakfast.     pravastatin (PRAVACHOL) 10 MG tablet Take 10 mg by  mouth daily.     No current facility-administered medications on file prior to visit.    LABS/IMAGING: No results found for this or any previous visit (from the past 48 hour(s)). No results found.  LIPID PANEL: No results found for: "CHOL", "TRIG", "HDL", "CHOLHDL", "VLDL", "LDLCALC", "LDLDIRECT"   WEIGHTS: Wt Readings from Last 3 Encounters:  01/02/23 149 lb (67.6 kg)  10/08/22 155 lb (70.3 kg)  09/25/22 153 lb 9.6 oz (69.7 kg)    VITALS: BP (!) 140/72 (BP Location: Left Arm, Patient Position: Sitting, Cuff Size: Normal)   Pulse 70   Ht 5' 4.5" (1.638 m)   Wt 149 lb (67.6 kg)   LMP 02/19/2011   SpO2 98%   BMI 25.18 kg/m   EXAM: General appearance: alert and no distress Neck: no carotid bruit, no JVD and thyroid not enlarged, symmetric, no tenderness/mass/nodules Lungs: clear to auscultation bilaterally Heart: regular rate and rhythm, S1, S2 normal, no murmur, click, rub or gallop Abdomen: soft, non-tender; bowel sounds normal; no masses,  no organomegaly Extremities: extremities normal, atraumatic, no cyanosis or edema Pulses: 2+ and symmetric Skin: Skin color, texture, turgor normal. No rashes or lesions Neurologic: Grossly normal Psych: Pleasant  EKG: EKG Interpretation Date/Time:  Thursday January 02 2023 10:24:18 EDT Ventricular Rate:  70 PR Interval:  170 QRS Duration:  108 QT  Interval:  394 QTC Calculation: 425 R Axis:   -48  Text Interpretation: Normal sinus rhythm Pulmonary disease pattern Incomplete right bundle branch block Left anterior fascicular block Moderate voltage criteria for LVH, may be normal variant ( R in aVL , Cornell product ) When compared with ECG of 25-Sep-2022 11:19, No significant change was found Confirmed by Zoila Shutter [phone]) on 01/02/2023 10:35:34 AM    ASSESSMENT: Progressive fatigue and exercise intolerance Mixed dyslipidemia with high LDL cholesterol PAF-on Eliquis Hypertension Family history of cardiovascular disease OSA on CPAP Aortic atherosclerosis  PLAN: 1.   Mrs. Nunez has multiple cardiovascular risk factors and has had some progressive fatigue and exercise intolerance.  Her cholesterol is very high and she has been on low-dose statins but not been able to tolerate higher doses.  I am concerned that she might have obstructive coronary disease.  She has a history of AF, hypertension and sleep apnea.  She denies any frank chest pain but again has had progressive fatigue and exercise intolerance which could be coronary artery disease.  He has known aortic atherosclerosis. I have recommended coronary CT angiography to further evaluate this.  Plan follow-up with me afterwards.  Chrystie Nose, MD, [facility], FACP  [place]  [facility]  Medical Director of the Advanced Lipid Disorders &  Cardiovascular Risk Reduction Clinic Diplomate of the American Board of Clinical Lipidology Attending Cardiologist  Direct Dial: [phone]  Fax: [phone]  Website:  [email or web address]   Lisette Abu Melany Wiesman 01/02/2023, 10:35 AM

## 2023-01-02 NOTE — Patient Instructions (Addendum)
Medication Instructions:  You will take a one time Metoprolol tartrate 2 hours before your CT  *If you need a refill on your cardiac medications before your next appointment, please call your pharmacy*   Lab Work: BMT, Lpa .   Testing/Procedures:   Your cardiac CT will be scheduled at one of the below locations:   [facility] [address] [place], [place] [address] [phone]  OR  [facility] [address] [place], [place] [address] [phone]  OR   [facility] [address] [place], [place] [address] [phone]  If scheduled at [facility], please arrive at the [facility] and Children's Entrance (Entrance C2) of [facility] 30 minutes prior to test start time. You can use the FREE valet parking offered at entrance C (encouraged to control the heart rate for the test)  Proceed to the [facility] Radiology Department (first floor) to check-in and test prep.  All radiology patients and guests should use entrance C2 at [facility], accessed from [facility], even though the hospital's physical address listed is [address]    If scheduled at [facility] or [facility], please arrive 15 mins early for check-in and test prep.  There is spacious parking and easy access to the radiology department from the [facility] V[facility] entrance. Please enter here and check-in with the desk attendant.   Please follow these instructions carefully (unless otherwise directed):  An IV will be required for this test and Nitroglycerin will be given.  Hold all erectile dysfunction medications at least 3 days (72 hrs) prior to test. (Ie viagra, cialis, sildenafil, tadalafil, etc)   On the Night Before the Test: Be sure to Drink plenty of water. Do not consume any caffeinated/decaffeinated beverages or  chocolate 12 hours prior to your test. Do not take any antihistamines 12 hours prior to your test.  On the Day of the Test: Drink plenty of water until 1 hour prior to the test. Do not eat any food 1 hour prior to test. You may take your regular medications prior to the test.  Take metoprolol (Lopressor) two hours prior to test. If you take Furosemide/Hydrochlorothiazide/Spironolactone, please HOLD on the morning of the test. FEMALES- please wear underwire-free bra if available, avoid dresses & tight clothing       After the Test: Drink plenty of water. After receiving IV contrast, you may experience a mild flushed feeling. This is normal. On occasion, you may experience a mild rash up to 24 hours after the test. This is not dangerous. If this occurs, you can take Benadryl 25 mg and increase your fluid intake. If you experience trouble breathing, this can be serious. If it is severe call 911 IMMEDIATELY. If it is mild, please call our office.   We will call to schedule your test 2-4 weeks out understanding that some insurance companies will need an authorization prior to the service being performed.   For more information and frequently asked questions, please visit our website : [email or web address]  For non-scheduling related questions, please contact the cardiac imaging nurse navigator should you have any questions/concerns: Cardiac Imaging Nurse Navigators Direct Office Dial: [phone]   For scheduling needs, including cancellations and rescheduling, please call [place], [phone].   Follow-Up: At [facility], you and your health needs are our priority.  As part of our continuing mission to provide you  with exceptional heart care, we have created designated Provider Care Teams.  These Care Teams include your primary Cardiologist (physician) and Advanced Practice Providers (APPs -  Physician Assistants and Nurse Practitioners) who all work together to  provide you with the care you need, when you need it.  We recommend signing up for the patient portal called "MyChart".  Sign up information is provided on this After Visit Summary.  MyChart is used to connect with patients for Virtual Visits (Telemedicine).  Patients are able to view lab/test results, encounter notes, upcoming appointments, etc.  Non-urgent messages can be sent to your provider as well.   To learn more about what you can do with MyChart, go to [email or web address].    Your next appointment:   6- 8 week(s) with Dr Rennis Golden or PA   Provider:   Chrystie Nose, MD

## 2023-01-03 LAB — LIPOPROTEIN A (LPA): Lipoprotein (a): 57 nmol/L (ref <75.0)

## 2023-01-03 LAB — BASIC METABOLIC PANEL
BUN/Creatinine Ratio: 16 (ref 12–28)
BUN: 10 mg/dL (ref 8–27)
CO2: 25 mmol/L (ref 20–29)
Calcium: 9.6 mg/dL (ref 8.7–10.3)
Chloride: 96 mmol/L (ref 96–106)
Creatinine, Ser: 0.62 mg/dL (ref 0.57–1.00)
Glucose: 97 mg/dL (ref 70–99)
Potassium: 4.9 mmol/L (ref 3.5–5.2)
Sodium: 135 mmol/L (ref 134–144)
eGFR: 92 mL/min/{1.73_m2} (ref >59)

## 2023-01-12 ENCOUNTER — Other Ambulatory Visit (HOSPITAL_COMMUNITY): Payer: Self-pay | Admitting: Physician Assistant

## 2023-01-15 ENCOUNTER — Encounter (HOSPITAL_COMMUNITY): Payer: Self-pay

## 2023-01-16 ENCOUNTER — Telehealth (HOSPITAL_COMMUNITY): Payer: Self-pay | Admitting: *Deleted

## 2023-01-16 NOTE — Telephone Encounter (Signed)
Patient returning call about her upcoming cardiac imaging study; pt verbalizes understanding of appt date/time, parking situation and where to check in, pre-test NPO status and medications ordered, and verified current allergies; name and call back number provided for further questions should they arise  Larey Brick RN Navigator Cardiac Imaging [facility] [phone] office [phone] cell  Patient to take 50mg  metoprolol tartrate two hours prior to her cardiac CT scan. She is aware to arrive at 3 PM.

## 2023-01-16 NOTE — Telephone Encounter (Signed)
Attempted to call patient regarding upcoming cardiac CT appointment. °Left message on voicemail with name and callback number ° °Edie Vallandingham RN Navigator Cardiac Imaging °[facility] °[phone] Office °[phone] Cell ° °

## 2023-01-17 ENCOUNTER — Other Ambulatory Visit: Payer: Self-pay | Admitting: Cardiovascular Disease

## 2023-01-17 ENCOUNTER — Ambulatory Visit (HOSPITAL_BASED_OUTPATIENT_CLINIC_OR_DEPARTMENT_OTHER)
Admission: RE | Admit: 2023-01-17 | Discharge: 2023-01-17 | Disposition: A | Discharge status: Home / Self Care | Payer: Medicare Other | Source: Ambulatory Visit | Attending: Cardiovascular Disease | Admitting: Cardiovascular Disease

## 2023-01-17 ENCOUNTER — Ambulatory Visit (HOSPITAL_COMMUNITY)
Admission: RE | Admit: 2023-01-17 | Discharge: 2023-01-17 | Disposition: A | Discharge status: Home / Self Care | Payer: Medicare Other | Source: Ambulatory Visit | Attending: Internal Medicine | Admitting: Internal Medicine

## 2023-01-17 DIAGNOSIS — I2510 Atherosclerotic heart disease of native coronary artery without angina pectoris: Secondary | ICD-10-CM

## 2023-01-17 DIAGNOSIS — R931 Abnormal findings on diagnostic imaging of heart and coronary circulation: Secondary | ICD-10-CM

## 2023-01-17 DIAGNOSIS — R079 Chest pain, unspecified: Secondary | ICD-10-CM | POA: Insufficient documentation

## 2023-01-17 DIAGNOSIS — R5383 Other fatigue: Secondary | ICD-10-CM | POA: Diagnosis present

## 2023-01-17 MED ORDER — NITROGLYCERIN 0.4 MG SL SUBL
SUBLINGUAL_TABLET | SUBLINGUAL | Status: AC
  Filled 2023-01-17: qty 2

## 2023-01-17 MED ORDER — NITROGLYCERIN 0.4 MG SL SUBL
0.8000 mg | SUBLINGUAL_TABLET | Freq: Once | SUBLINGUAL | Status: AC
  Administered 2023-01-17: 0.8 mg via SUBLINGUAL

## 2023-01-17 MED ORDER — IOHEXOL 350 MG/ML SOLN
95.0000 mL | Freq: Once | INTRAVENOUS | Status: AC | PRN
  Administered 2023-01-17: 95 mL via INTRAVENOUS

## 2023-02-06 ENCOUNTER — Other Ambulatory Visit (HOSPITAL_COMMUNITY): Payer: Self-pay

## 2023-02-06 ENCOUNTER — Encounter: Payer: Self-pay | Admitting: Internal Medicine

## 2023-02-06 ENCOUNTER — Telehealth: Payer: Self-pay | Admitting: Pharmacy Technician

## 2023-02-06 ENCOUNTER — Ambulatory Visit: Payer: Medicare Other | Attending: Internal Medicine | Admitting: Internal Medicine

## 2023-02-06 VITALS — BP 173/84 | HR 66 | Ht 64.0 in | Wt 147.0 lb

## 2023-02-06 DIAGNOSIS — R931 Abnormal findings on diagnostic imaging of heart and coronary circulation: Secondary | ICD-10-CM | POA: Diagnosis not present

## 2023-02-06 DIAGNOSIS — E785 Hyperlipidemia, unspecified: Secondary | ICD-10-CM

## 2023-02-06 DIAGNOSIS — I480 Paroxysmal atrial fibrillation: Secondary | ICD-10-CM

## 2023-02-06 DIAGNOSIS — I2510 Atherosclerotic heart disease of native coronary artery without angina pectoris: Secondary | ICD-10-CM

## 2023-02-06 NOTE — Telephone Encounter (Signed)
-----   Message from Nurse Eileen Stanford E sent at 02/06/2023  2:12 PM EST ----- Regarding: PA Repatha Please complete PA for Repatha for patient.   Thanks!

## 2023-02-06 NOTE — Patient Instructions (Signed)
Medication Instructions:  Dr. Rennis Golden recommends Repatha 140mg /ml (PCSK9). This is an injectable cholesterol medication self-administered once every 14 days. This medication will likely need prior approval with your insurance company, which we will work on. If the medication is not approved initially, we may need to do an appeal with your insurance.   Administer medication in area of fatty tissue such as abdomen, outer thigh, back of upper arm - and rotate site with each injection Store medication in refrigerator until ready to administer - allow to sit at room temp for 30 mins - 1 hour prior to injection Dispose of medication in a SHARPS container - your pharmacy should be able to direct you on this and proper disposal   If you need a co-pay card for Repatha: Lawsponsor.fr If you need a co-pay card for Praluent: [email or web address]  Patient Assistance:    These foundations have funds at various times.   The PAN Foundation: https://www.panfoundation.org/disease-funds/hypercholesterolemia/ -- can sign up for wait list  The [facility] offers assistance to help pay for medication copays.  They will cover copays for all cholesterol lowering meds, including statins, fibrates, omega-3 fish oils like Vascepa, ezetimibe, Repatha, Praluent, Nexletol, Nexlizet.  The cards are usually good for $2,500 or 12 months, whichever comes first. Our fax # is [phone] (you will need this to apply) Go to healthwellfoundation.org Click on "Apply Now" Answer questions as to whom is applying (patient or representative) Your disease fund will be "hypercholesterolemia - Medicare access" They will ask questions about finances and which medications you are taking for cholesterol When you submit, the approval is usually within minutes.  You will need to print the card information from the site You will need to show this information to your pharmacy, they will  bill your Medicare Part D plan first -then bill [employer] Well --for the copay.   You can also call them at [phone], although the hold times can be quite long.     *If you need a refill on your cardiac medications before your next appointment, please call your pharmacy*   Lab Work: FASTING NMR lipoprofile in 3-4 months   If you have labs (blood work) drawn today and your tests are completely normal, you will receive your results only by: MyChart Message (if you have MyChart) OR A paper copy in the mail If you have any lab test that is abnormal or we need to change your treatment, we will call you to review the results.   Follow-Up: At [facility], you and your health needs are our priority.  As part of our continuing mission to provide you with exceptional heart care, we have created designated Provider Care Teams.  These Care Teams include your primary Cardiologist (physician) and Advanced Practice Providers (APPs -  Physician Assistants and Nurse Practitioners) who all work together to provide you with the care you need, when you need it.  We recommend signing up for the patient portal called "MyChart".  Sign up information is provided on this After Visit Summary.  MyChart is used to connect with patients for Virtual Visits (Telemedicine).  Patients are able to view lab/test results, encounter notes, upcoming appointments, etc.  Non-urgent messages can be sent to your provider as well.   To learn more about what you can do with MyChart, go to [email or web address].    Your next appointment:   3-4 months with Dr. Rennis Golden

## 2023-02-06 NOTE — Progress Notes (Signed)
OFFICE NOTE  Chief Complaint:  Follow-up  Primary Care Physician: Tracey Harries, MD  HPI:  Madison Nunez is a 77 y.o. female with a past medial history significant for breast CA in remission (port still in place), family history of CAD, possible mitral valve prolapse, HTN, hyperlipidemia and anxiety. She presents with several days of labile hypertension, flushing and palpitations, but no complaints of frank chest pain. Troponin has been negative x 5. EKG personally reviewed shows a sinus tachy, LVH with strain, incomplete RBBB. She was previously on carvedilol but then developed flushing. Was on metoprolol, but I'm not sure that controlled her BP - also on lisinopril. Switched by PCP to amlodipine and feels she is now having the same symptoms of flushing. During her hospitalization, I advised stopping her amlodipine and starting low dose toprol. A 30 day monitor was ordered. I have personally reviewed her monitor which shows no significant arrhythmias or extrasystoles. She reports feeling well without further palpitations. She is not taking her b-blocker.  07/18/2020  Madison Nunez returns today for follow-up.  She has been seen most recently by the A. fib clinic.  She is now on Eliquis.  She was taking long-acting diltiazem but had side effects with it and felt like she was in a fog.  She is now using diltiazem as needed.  She had several episodes of atrial fibrillation but probably has less than 4 or 5 events a month.  She uses diltiazem as needed for that.  There was some discussion between her and Mr. Charlean Merl about antiarrhythmic therapy.  I mentioned that today.  She also feels that she has some fatigue.  This might be related to A. fib or other possible causes.  We discussed the possibility of sleep apnea.  She says she lives alone and is not aware that she snores but does feel tired during the day has to take a nap and oftentimes has a nonrestorative sleep at night.  12/07/2021  Madison Nunez  is seen today in follow-up.  Overall she seems to be doing well.  She did bring in a list of her blood pressure readings and noted a few readings that were a little high with associated mild elevation in heart rate to the low 100s.  She is concerned as to whether these might be related to A-fib but does not have clear recurrent palpitations.  She has been followed in the A-fib clinic and has been maintaining sinus rhythm.  EKG today again shows sinus rhythm.  She is using a as needed diltiazem strategy due to underlying bifascicular block.  Blood pressure today was well controlled.  She denies any chest pain or shortness of breath.  01/02/2023  Madison Nunez is seen today in follow-up.  She denies any significant recurrent palpitations.  EKG today shows no A-fib.  She has no awareness of this.  She was recently taken off of pravastatin and switch to 10 mg atorvastatin by her PCP.  Apparently she had some side effects but not clearly myalgias.  She was hesitant to take higher doses of statins.  Looking into this further and appears that her cholesterol was quite high in September with total 264, triglycerides 78, HDL 54 and LDL 178.  She also has a history of high LDL cholesterol.  Additionally there is heart disease in her family.  She was noted on imaging in 2022 to have aortic atherosclerosis but has not had any prior coronary evaluation.  After discussing this she noted  that she has been having some progressive fatigue and exercise intolerance with exertion.  02/07/2023  Madison Nunez returns today for follow-up.  She underwent CT coronary angiography.  Showed extensive coronary artery calcification with a calcium score of 874, 91st percentile.  There was mild to moderate cardiovascular disease with possible obstruction in the LAD however it was distal/apical and tapering therefore thought to be not significant.  And on low-dose statin therapy but did not tolerate this well and has subsequently stopped taking  it.  Her last lipid profile as mentioned above showed an LDL of 178 and needs to be quite a bit lower to lower her risk.   PMHx:  Past Medical History:  Diagnosis Date   Arthritis    hands and lower back   Atrial fibrillation [facility])    Per patient   Breast cancer (HCC) 2005   right lumpectomy, chemotherapy with A/C combo  & Taxol, mammogram 02/06/11 without recurrence   Dyspnea    Echocardiogram 02/20/11: EF 60-65%.;  ETT-myoveiw 12/12 normal with EF 80%   Hypertension    Hypothyroidism    Personal history of chemotherapy 10/2003   Personal history of radiation therapy 03/2004    Past Surgical History:  Procedure Laterality Date   APPENDECTOMY  1955   BREAST BIOPSY  08/29/2003   malignant   BREAST LUMPECTOMY  2005   right   TONSILLECTOMY     "as a child"    FAMHx:  Family History  Problem Relation Age of Onset   Alzheimer's disease Mother    Coronary artery disease Father        had juvenile DM most of his life, had 3 MI's the first in his early 73's, died at 69   Diabetes Father    Coronary artery disease Brother 19       died of first MI at 63   Breast cancer Maternal Grandmother     SOCHx:   reports that she has quit smoking. Her smoking use included cigarettes. She has a 5 Nunez-year smoking history. She has never used smokeless tobacco. She reports that she does not currently use alcohol after a past usage of about 7.0 standard drinks of alcohol per week. She reports that she does not use drugs.  ALLERGIES:  Allergies  Allergen Reactions   Hctz [Hydrochlorothiazide] Other (See Comments)    Hyponatremia (leading to hospitalization)   Beta Adrenergic Blockers Nausea Only and Other (See Comments)    Metoprolol and Carvedilol have stopped working for patient over time, causing nausea also   Prednisone Other (See Comments)    Had reaction with use prior to chemo- ONLY IN HIGHER DOSES   Carvedilol Other (See Comments)    Tolerates other beta-blockers, Tolerates  other beta-blockers    ROS: Pertinent items noted in HPI and remainder of comprehensive ROS otherwise negative.  HOME MEDS: Current Outpatient Medications on File Prior to Visit  Medication Sig Dispense Refill   ascorbic Acid (VITAMIN C) 500 MG CPCR Take 500 mg by mouth daily.     B Complex Vitamins (VITAMIN B COMPLEX) TABS Take 0.5-1 tablets by mouth in the morning.     Cholecalciferol (VITAMIN D3) 50 MCG (2000 UT) TABS Take 2,000 Units by mouth daily.     diltiazem (CARDIZEM) 30 MG tablet TAKE 1 TABLET BY MOUTH EVERY 4 HOURS AS NEEDED FOR HEART RATES OVER 100 15 tablet 1   ELIQUIS 5 MG TABS tablet TAKE 1 TABLET(5 MG) BY MOUTH TWICE DAILY 60 tablet  5   Flaxseed Oil OIL Take 1,300 mg by mouth in the morning and at bedtime.     latanoprost (XALATAN) 0.005 % ophthalmic solution Place 1 drop into both eyes at bedtime.     levothyroxine (SYNTHROID, LEVOTHROID) 50 MCG tablet Take 50 mcg by mouth daily.     lisinopril (PRINIVIL,ZESTRIL) 40 MG tablet Take 1 tablet (40 mg total) by mouth daily. 30 tablet 0   metoprolol tartrate (LOPRESSOR) 50 MG tablet Take 1 tablet (50 mg total) by mouth as directed. Take 2 hours prior to CT test 1 tablet 0   Multiple Vitamins-Minerals (CENTRUM SILVER 50+WOMEN) TABS Take 1 tablet by mouth daily with breakfast.     pravastatin (PRAVACHOL) 10 MG tablet Take 10 mg by mouth daily.     No current facility-administered medications on file prior to visit.    LABS/IMAGING: No results found for this or any previous visit (from the past 48 hour(s)). No results found.  LIPID PANEL: No results found for: "CHOL", "TRIG", "HDL", "CHOLHDL", "VLDL", "LDLCALC", "LDLDIRECT"   WEIGHTS: Wt Readings from Last 3 Encounters:  02/06/23 147 lb (66.7 kg)  01/02/23 149 lb (67.6 kg)  10/08/22 155 lb (70.3 kg)    VITALS: BP (!) 173/84 (BP Location: Left Arm, Patient Position: Sitting, Cuff Size: Normal)   Pulse 66   Ht 5\' 4"  (1.626 m)   Wt 147 lb (66.7 kg)   LMP 02/19/2011    SpO2 99%   BMI 25.23 kg/m   EXAM: Deferred  EKG: Deferred  ASSESSMENT: Progressive fatigue and exercise intolerance -abnormal coronary CT demonstrating a calcium score of 274, 91st percentile.  (01/2023), FFR abnormal in the distal LAD. Mixed dyslipidemia with high LDL cholesterol Statin intolerant-myalgias. PAF-on Eliquis Hypertension Family history of cardiovascular disease OSA on CPAP Aortic atherosclerosis  PLAN: 1.   Mrs. Nunez was noted to have multivessel coronary artery calcification with a high calcium score although nonobstructive disease essentially.  The distal LAD was abnormal by FFR but tapering.  She needs aggressive medical therapy but cannot tolerate statins.  She is a good candidate for PCSK9 inhibitor and we will proceed with that.  Her most recent LDL was 178.  She needs at least 50% reduction in this hopefully to target LDL less than 70.  She is on Eliquis and therefore will not advise starting aspirin.  She has had some episodes of A-fib however they are brief and nonsustained.  If they increase we may need to consider alternative therapies.  Plan follow-up with repeat lipids in about 3 to 4 months.  Chrystie Nose, MD, [facility], FACP  [place]  [facility]  Medical Director  of the Advanced Lipid Disorders &  Cardiovascular Risk Reduction Clinic Diplomate of the American Board of Clinical Lipidology Attending Cardiologist  Direct Dial: [phone]  Fax: [phone]  Website:  [email or web address] Nicely Madison [email or web address] 02/06/2023, 1:45 PM

## 2023-02-06 NOTE — Telephone Encounter (Signed)
Pharmacy Patient Advocate Encounter   Received notification from Physician's Office that prior authorization for repatha is required/requested.   Insurance verification completed.   The patient is insured through [facility] .   Per test claim: PA required; PA submitted to above mentioned insurance via CoverMyMeds Key/confirmation #/EOC [id number] Status is pending

## 2023-02-07 ENCOUNTER — Other Ambulatory Visit (HOSPITAL_COMMUNITY): Payer: Self-pay

## 2023-02-07 NOTE — Telephone Encounter (Signed)
Pharmacy Patient Advocate Encounter  Received notification from  RX [employer]  that Prior Authorization for repatha has been APPROVED from 02/06/23 to 08/07/23. Ran test claim, Copay is $141.27- one month. This test claim was processed through [facility]- copay amounts may vary at other pharmacies due to pharmacy/plan contracts, or as the patient moves through the different stages of their insurance plan.   PA #/Case ID/Reference #: [id number]

## 2023-02-07 NOTE — Telephone Encounter (Signed)
Update sent to patient in MyChart 

## 2023-02-13 MED ORDER — REPATHA SURECLICK 140 MG/ML ~~LOC~~ SOAJ: 140.0000 mg | SUBCUTANEOUS | 3 refills | Status: DC

## 2023-02-13 NOTE — Addendum Note (Signed)
Addended by: Lindell Spar on: 02/13/2023 09:48 AM   Modules accepted: Orders

## 2023-02-13 NOTE — Telephone Encounter (Signed)
Rx(s) sent to pharmacy electronically.  

## 2023-02-27 ENCOUNTER — Telehealth: Payer: Self-pay | Admitting: Internal Medicine

## 2023-02-27 NOTE — Telephone Encounter (Signed)
Pt c/o medication issue:  1. Name of Medication: Repatha  2. How are you currently taking this medication (dosage and times per day)?   3. Are you having a reaction (difficulty breathing--STAT)?   4. What is your medication issue? Patient wants to talk to a nurse about this medicine. She says she have some concerns about this medicine, because it is so powerful

## 2023-02-27 NOTE — Telephone Encounter (Signed)
Called and spoke to patient. Verified name and DOB. Patient has concerns about taking Repatha. She is nervous and anxious about the side effects such as diabetes, GI issues, muscle soreness. She asked how long does it take before she start to have side effects, what medication should she take, is it okay to take Tylenol. I informed patient not everyone has side effect and I could not tell her how long it would take for her to have side effects. She lives by herself and is worried that she will not be able to take care of herself. I did go talk to [facility] the pharmacist about patient's concerns and she advise telling patient.  Only 3-5% of people taking Repatha have side effects. Symptoms usually start around doses 3-5. The cold symptoms usually only last 24 hours.  After telling patient informations she stated she felt better about taking the medication and will give it a try. She said she only have 3-4 doses to take before she see Dr Rennis Golden. Advised her if she have any other questions or concerns feel free to reach back out to the office. Patient verbalized understanding and agree.

## 2023-04-06 ENCOUNTER — Other Ambulatory Visit: Payer: Self-pay | Admitting: Internal Medicine

## 2023-04-06 DIAGNOSIS — I480 Paroxysmal atrial fibrillation: Secondary | ICD-10-CM

## 2023-04-07 NOTE — Telephone Encounter (Signed)
Prescription refill request for Eliquis received. Indication:afib Last office visit:12/24 Scr:0.62  10/24 Age: 78 Weight:66.7  kg  Prescription refilled

## 2023-04-16 ENCOUNTER — Telehealth: Payer: Self-pay | Admitting: Cardiovascular Disease

## 2023-04-16 NOTE — Telephone Encounter (Signed)
What problem are you experiencing? [employer] has changed her C-pap supply company to [employer]  Who is your medical equipment company? She wants her supply orders sent to [facility]  3)    If patient is calling about their sleep study results please route to CV DIV Sleep Study Pool.   Please route to the sleep study coordinator.

## 2023-04-25 NOTE — Telephone Encounter (Signed)
 Supply order sent to [facility] for PAP Supplies today.

## 2023-05-04 LAB — NMR, LIPOPROFILE
Cholesterol, Total: 173 mg/dL (ref 100–199)
HDL Particle Number: 36.6 umol/L (ref >=30.5)
HDL-C: 76 mg/dL (ref >39)
LDL Particle Number: 663 nmol/L (ref <1000)
LDL Size: 22.2 nm (ref >20.5)
LDL-C (NIH Calc): 84 mg/dL (ref 0–99)
LP-IR Score: <25 (ref <=45)
Small LDL Particle Number: <90 nmol/L (ref <=527)
Triglycerides: 70 mg/dL (ref 0–149)

## 2023-05-09 ENCOUNTER — Ambulatory Visit: Payer: Medicare Other | Attending: Internal Medicine | Admitting: Internal Medicine

## 2023-05-09 VITALS — BP 150/74 | HR 63 | Ht 64.0 in | Wt 145.0 lb

## 2023-05-09 DIAGNOSIS — I2510 Atherosclerotic heart disease of native coronary artery without angina pectoris: Secondary | ICD-10-CM

## 2023-05-09 DIAGNOSIS — I480 Paroxysmal atrial fibrillation: Secondary | ICD-10-CM

## 2023-05-09 DIAGNOSIS — E785 Hyperlipidemia, unspecified: Secondary | ICD-10-CM | POA: Diagnosis not present

## 2023-05-09 NOTE — Patient Instructions (Signed)
 Medication Instructions:  NO CHANGES  *If you need a refill on your cardiac medications before your next appointment, please call your pharmacy*   Follow-Up: At [facility], you and your health needs are our priority.  As part of our continuing mission to provide you with exceptional heart care, we have created designated Provider Care Teams.  These Care Teams include your primary Cardiologist (physician) and Advanced Practice Providers (APPs -  Physician Assistants and Nurse Practitioners) who all work together to provide you with the care you need, when you need it.  We recommend signing up for the patient portal called "MyChart".  Sign up information is provided on this After Visit Summary.  MyChart is used to connect with patients for Virtual Visits (Telemedicine).  Patients are able to view lab/test results, encounter notes, upcoming appointments, etc.  Non-urgent messages can be sent to your provider as well.   To learn more about what you can do with MyChart, go to [email or web address].    Your next appointment:    12 months with Dr. Rennis Golden

## 2023-05-09 NOTE — Progress Notes (Signed)
 OFFICE NOTE  Chief Complaint:  Follow-up  Primary Care Physician: Tracey Harries, MD  HPI:  Madison Nunez is a 78 y.o. female with a past medial history significant for breast CA in remission (port still in place), family history of CAD, possible mitral valve prolapse, HTN, hyperlipidemia and anxiety. She presents with several days of labile hypertension, flushing and palpitations, but no complaints of frank chest pain. Troponin has been negative x 5. EKG personally reviewed shows a sinus tachy, LVH with strain, incomplete RBBB. She was previously on carvedilol but then developed flushing. Was on metoprolol, but I'm not sure that controlled her BP - also on lisinopril. Switched by PCP to amlodipine and feels she is now having the same symptoms of flushing. During her hospitalization, I advised stopping her amlodipine and starting low dose toprol. A 30 day monitor was ordered. I have personally reviewed her monitor which shows no significant arrhythmias or extrasystoles. She reports feeling well without further palpitations. She is not taking her b-blocker.  07/18/2020  Madison Nunez returns today for follow-up.  She has been seen most recently by the A. fib clinic.  She is now on Eliquis.  She was taking long-acting diltiazem but had side effects with it and felt like she was in a fog.  She is now using diltiazem as needed.  She had several episodes of atrial fibrillation but probably has less than 4 or 5 events a month.  She uses diltiazem as needed for that.  There was some discussion between her and Mr. Charlean Merl about antiarrhythmic therapy.  I mentioned that today.  She also feels that she has some fatigue.  This might be related to A. fib or other possible causes.  We discussed the possibility of sleep apnea.  She says she lives alone and is not aware that she snores but does feel tired during the day has to take a nap and oftentimes has a nonrestorative sleep at night.  12/07/2021  Madison Nunez  is seen today in follow-up.  Overall she seems to be doing well.  She did bring in a list of her blood pressure readings and noted a few readings that were a little high with associated mild elevation in heart rate to the low 100s.  She is concerned as to whether these might be related to A-fib but does not have clear recurrent palpitations.  She has been followed in the A-fib clinic and has been maintaining sinus rhythm.  EKG today again shows sinus rhythm.  She is using a as needed diltiazem strategy due to underlying bifascicular block.  Blood pressure today was well controlled.  She denies any chest pain or shortness of breath.  01/02/2023  Madison Nunez is seen today in follow-up.  She denies any significant recurrent palpitations.  EKG today shows no A-fib.  She has no awareness of this.  She was recently taken off of pravastatin and switch to 10 mg atorvastatin by her PCP.  Apparently she had some side effects but not clearly myalgias.  She was hesitant to take higher doses of statins.  Looking into this further and appears that her cholesterol was quite high in September with total 264, triglycerides 78, HDL 54 and LDL 178.  She also has a history of high LDL cholesterol.  Additionally there is heart disease in her family.  She was noted on imaging in 2022 to have aortic atherosclerosis but has not had any prior coronary evaluation.  After discussing this she noted  that she has been having some progressive fatigue and exercise intolerance with exertion.  02/07/2023  Madison Nunez returns today for follow-up.  She underwent CT coronary angiography.  Showed extensive coronary artery calcification with a calcium score of 874, 91st percentile.  There was mild to moderate cardiovascular disease with possible obstruction in the LAD however it was distal/apical and tapering therefore thought to be not significant.  And on low-dose statin therapy but did not tolerate this well and has subsequently stopped taking  it.  Her last lipid profile as mentioned above showed an LDL of 178 and needs to be quite a bit lower to lower her risk.   05/09/2023  Madison Nunez is seen today for follow-up.  She has done remarkably well with Repatha.  Although she was quite anxious about starting it, she has had no side effects and marked improvement in her lipids.  LDL is improved down to 84 with a particle #663, HDL 76 and triglycerides 70.  Small LDL particle number less than 90.  PMHx:  Past Medical History:  Diagnosis Date   Arthritis    hands and lower back   Atrial fibrillation [facility])    Per patient   Breast cancer (HCC) 2005   right lumpectomy, chemotherapy with A/C combo  & Taxol, mammogram 02/06/11 without recurrence   Dyspnea    Echocardiogram 02/20/11: EF 60-65%.;  ETT-myoveiw 12/12 normal with EF 80%   Hypertension    Hypothyroidism    Personal history of chemotherapy 10/2003   Personal history of radiation therapy 03/2004    Past Surgical History:  Procedure Laterality Date   APPENDECTOMY  1955   BREAST BIOPSY  08/29/2003   malignant   BREAST LUMPECTOMY  2005   right   TONSILLECTOMY     "as a child"    FAMHx:  Family History  Problem Relation Age of Onset   Alzheimer's disease Mother    Coronary artery disease Father        had juvenile DM most of his life, had 3 MI's the first in his early 47's, died at 31   Diabetes Father    Coronary artery disease Brother 19       died of first MI at 61   Breast cancer Maternal Grandmother     SOCHx:   reports that she has quit smoking. Her smoking use included cigarettes. She has a 5 pack-year smoking history. She has never used smokeless tobacco. She reports that she does not currently use alcohol after a past usage of about 7.0 standard drinks of alcohol per week. She reports that she does not use drugs.  ALLERGIES:  Allergies  Allergen Reactions   Hctz [Hydrochlorothiazide] Other (See Comments)    Hyponatremia (leading to hospitalization)    Beta Adrenergic Blockers Nausea Only and Other (See Comments)    Metoprolol and Carvedilol have stopped working for patient over time, causing nausea also   Prednisone Other (See Comments)    Had reaction with use prior to chemo- ONLY IN HIGHER DOSES   Pravastatin     myalgias   Carvedilol Other (See Comments)    Tolerates other beta-blockers, Tolerates other beta-blockers    ROS: Pertinent items noted in HPI and remainder of comprehensive ROS otherwise negative.  HOME MEDS: Current Outpatient Medications on File Prior to Visit  Medication Sig Dispense Refill   ascorbic Acid (VITAMIN C) 500 MG CPCR Take 500 mg by mouth daily.     B Complex Vitamins (VITAMIN B COMPLEX)  TABS Take 0.5-1 tablets by mouth in the morning.     Cholecalciferol (VITAMIN D3) 50 MCG (2000 UT) TABS Take 2,000 Units by mouth daily.     diltiazem (CARDIZEM) 30 MG tablet TAKE 1 TABLET BY MOUTH EVERY 4 HOURS AS NEEDED FOR HEART RATES OVER 100 15 tablet 1   ELIQUIS 5 MG TABS tablet TAKE 1 TABLET(5 MG) BY MOUTH TWICE DAILY 60 tablet 5   Evolocumab (REPATHA SURECLICK) 140 MG/ML SOAJ Inject 140 mg into the skin every 14 (fourteen) days. 6 mL 3   Flaxseed Oil OIL Take 1,300 mg by mouth in the morning and at bedtime.     latanoprost (XALATAN) 0.005 % ophthalmic solution Place 1 drop into both eyes at bedtime.     levothyroxine (SYNTHROID, LEVOTHROID) 50 MCG tablet Take 50 mcg by mouth daily.     lisinopril (PRINIVIL,ZESTRIL) 40 MG tablet Take 1 tablet (40 mg total) by mouth daily. 30 tablet 0   Multiple Vitamins-Minerals (CENTRUM SILVER 50+WOMEN) TABS Take 1 tablet by mouth daily with breakfast.     metoprolol tartrate (LOPRESSOR) 50 MG tablet Take 1 tablet (50 mg total) by mouth as directed. Take 2 hours prior to CT test 1 tablet 0   No current facility-administered medications on file prior to visit.    LABS/IMAGING: No results found for this or any previous visit (from the past 48 hours). No results found.  LIPID  PANEL: No results found for: "CHOL", "TRIG", "HDL", "CHOLHDL", "VLDL", "LDLCALC", "LDLDIRECT"   WEIGHTS: Wt Readings from Last 3 Encounters:  05/09/23 145 lb (65.8 kg)  02/06/23 147 lb (66.7 kg)  01/02/23 149 lb (67.6 kg)    VITALS: BP (!) 150/74 (BP Location: Left Arm, Patient Position: Sitting, Cuff Size: Normal)   Pulse 63   Ht 5\' 4"  (1.626 m)   Wt 145 lb (65.8 kg)   LMP 02/19/2011   SpO2 99%   BMI 24.89 kg/m   EXAM: Deferred  EKG: Deferred   ASSESSMENT: Progressive fatigue and exercise intolerance -abnormal coronary CT demonstrating a calcium score of 274, 91st percentile.  (01/2023), FFR abnormal in the distal LAD. Mixed dyslipidemia with high LDL cholesterol Statin intolerant-myalgias. PAF-on Eliquis Hypertension Family history of cardiovascular disease OSA on CPAP Aortic atherosclerosis  PLAN: 1.   Madison Nunez seems to be doing well on Repatha.  She is tolerating it without any side effects.  She is very pleased at the improvement in her lipids with a marked improvement in her cholesterol.  Although LDL is above 70 her particle numbers are very low.  I will continue with this therapy for now and continue to be mindful of diet and maintain activity.  No recent recurrent A-fib.  Although her blood pressure was elevated today in the office, she brought in home blood pressure readings which consistently show blood pressure 120-130 systolic.  No changes to her medicines today.  Plan follow-up with me annually or sooner if necessary.  Chrystie Nose, MD, [facility], FACP  [place]  [facility]  Medical Director  of the Advanced Lipid Disorders &  Cardiovascular Risk Reduction Clinic Diplomate of the American Board of Clinical Lipidology Attending Cardiologist  Direct Dial: [phone]  Fax: [phone]  Website:  [email or web address] Alynn Ellithorpe 05/09/2023, 11:32 AM

## 2023-05-16 ENCOUNTER — Other Ambulatory Visit: Payer: Self-pay

## 2023-05-16 DIAGNOSIS — E785 Hyperlipidemia, unspecified: Secondary | ICD-10-CM

## 2023-05-17 LAB — NMR, LIPOPROFILE
Cholesterol, Total: 151 mg/dL (ref 100–199)
HDL Particle Number: 32.5 umol/L (ref >=30.5)
HDL-C: 68 mg/dL (ref >39)
LDL Particle Number: 582 nmol/L (ref <1000)
LDL Size: 21.3 nm (ref >20.5)
LDL-C (NIH Calc): 64 mg/dL (ref 0–99)
LP-IR Score: <25 (ref <=45)
Small LDL Particle Number: 157 nmol/L (ref <=527)
Triglycerides: 109 mg/dL (ref 0–149)

## 2023-05-21 ENCOUNTER — Encounter: Payer: Self-pay | Admitting: *Deleted

## 2023-05-30 ENCOUNTER — Other Ambulatory Visit: Payer: Self-pay | Admitting: Family Medicine

## 2023-05-30 DIAGNOSIS — Z1231 Encounter for screening mammogram for malignant neoplasm of breast: Secondary | ICD-10-CM

## 2023-06-02 ENCOUNTER — Other Ambulatory Visit: Payer: Self-pay

## 2023-06-02 DIAGNOSIS — E785 Hyperlipidemia, unspecified: Secondary | ICD-10-CM

## 2023-06-02 DIAGNOSIS — I2510 Atherosclerotic heart disease of native coronary artery without angina pectoris: Secondary | ICD-10-CM

## 2023-06-02 DIAGNOSIS — G4733 Obstructive sleep apnea (adult) (pediatric): Secondary | ICD-10-CM

## 2023-06-02 DIAGNOSIS — I10 Essential (primary) hypertension: Secondary | ICD-10-CM

## 2023-06-02 DIAGNOSIS — I480 Paroxysmal atrial fibrillation: Secondary | ICD-10-CM

## 2023-06-04 ENCOUNTER — Other Ambulatory Visit: Payer: Self-pay | Admitting: Family Medicine

## 2023-06-04 DIAGNOSIS — Z780 Asymptomatic menopausal state: Secondary | ICD-10-CM

## 2023-06-07 ENCOUNTER — Ambulatory Visit
Admission: RE | Admit: 2023-06-07 | Discharge: 2023-06-07 | Disposition: A | Discharge status: Home / Self Care | Source: Ambulatory Visit | Attending: Family Medicine | Admitting: Family Medicine

## 2023-06-07 DIAGNOSIS — Z780 Asymptomatic menopausal state: Secondary | ICD-10-CM

## 2023-06-26 ENCOUNTER — Ambulatory Visit
Admission: RE | Admit: 2023-06-26 | Discharge: 2023-06-26 | Disposition: A | Discharge status: Home / Self Care | Source: Ambulatory Visit | Attending: Family Medicine | Admitting: Family Medicine

## 2023-06-26 DIAGNOSIS — Z1231 Encounter for screening mammogram for malignant neoplasm of breast: Secondary | ICD-10-CM

## 2023-08-29 ENCOUNTER — Ambulatory Visit (HOSPITAL_COMMUNITY)
Admission: RE | Admit: 2023-08-29 | Discharge: 2023-08-29 | Disposition: A | Discharge status: Home / Self Care | Source: Ambulatory Visit | Attending: Physician Assistant | Admitting: Physician Assistant

## 2023-08-29 VITALS — BP 142/80 | HR 64 | Ht 64.0 in | Wt 150.8 lb

## 2023-08-29 DIAGNOSIS — D6869 Other thrombophilia: Secondary | ICD-10-CM | POA: Diagnosis not present

## 2023-08-29 DIAGNOSIS — I4891 Unspecified atrial fibrillation: Secondary | ICD-10-CM | POA: Diagnosis not present

## 2023-08-29 DIAGNOSIS — I480 Paroxysmal atrial fibrillation: Secondary | ICD-10-CM

## 2023-08-29 NOTE — Addendum Note (Signed)
 Encounter addended by: Nellene Quita SAUNDERS, PA on: 08/29/2023 11:30 AM  Actions taken: Flowsheet accepted, Clinical Note Signed

## 2023-08-29 NOTE — Progress Notes (Addendum)
 Primary Care Physician: Pura Lenis, MD Primary Cardiologist: Dr Mona Primary Electrophysiologist: none Referring Physician: Dr Alveta Jenkins Madison Nunez is a 78 y.o. female with a history of HTN, CAD, OSA, hypothyroidism, breast cancer, and atrial fibrillation who presents for follow up in the [facility]. The patient was initially diagnosed with atrial fibrillation 04/15/20 after presenting to the ED with symptoms of palpitations. ECG showed afib with RVR. She converted back to SR in the ED. Patient is on Eliquis  for stroke prevention. She does report that she has had palpitations for years but had never been diagnosed with afib. Patient did not tolerate daily diltiazem  due to bradycardia and dizziness. She was started on PRN diltiazem .   Patient returns for follow up for atrial fibrillation. She reports that she had several episodes in December of last year but none since. She changed her diet to include more electrolytes at that time. No bleeding issues on anticoagulation.   Today, she  denies symptoms of palpitations, chest pain, shortness of breath, orthopnea, PND, lower extremity edema, dizziness, presyncope, syncope, snoring, daytime somnolence, bleeding, or neurologic sequela. The patient is tolerating medications without difficulties and is otherwise without complaint today.    Atrial Fibrillation Risk Factors:  she does have symptoms or diagnosis of sleep apnea. she does not have a history of rheumatic fever. she does have a history of alcohol use. The patient does not have a history of early familial atrial fibrillation or other arrhythmias.   Atrial Fibrillation Management history:  Previous antiarrhythmic drugs: none Previous cardioversions: none Previous ablations: none Anticoagulation history: Eliquis    Past Medical History:  Diagnosis Date   Arthritis    hands and lower back   Atrial fibrillation [facility])    Per patient   Breast cancer  (HCC) 2005   right lumpectomy, chemotherapy with A/C combo  & Taxol, mammogram 02/06/11 without recurrence   Dyspnea    Echocardiogram 02/20/11: EF 60-65%.;  ETT-myoveiw 12/12 normal with EF 80%   Hypertension    Hypothyroidism    Personal history of chemotherapy 10/2003   Personal history of radiation therapy 03/2004    ROS- All systems are reviewed and negative except as per the HPI above.  Physical Exam: Vitals:   08/29/23 1055  BP: (!) 142/80  Pulse: 64  Weight: 68.4 kg  Height: 5' 4 (1.626 m)     GEN: Well nourished, well developed in no acute distress CARDIAC: Regular rate and rhythm, no murmurs, rubs, gallops RESPIRATORY:  Clear to auscultation without rales, wheezing or rhonchi  ABDOMEN: Soft, non-tender, non-distended EXTREMITIES:  No edema; No deformity    Wt Readings from Last 3 Encounters:  08/29/23 68.4 kg  05/09/23 65.8 kg  02/06/23 66.7 kg    EKG today demonstrates  SR, inc RBBB, LAFB Vent. rate 64 BPM PR interval 186 ms QRS duration 106 ms QT/QTcB 410/422 ms   Echo 05/23/20 demonstrated  1. Left ventricular ejection fraction, by estimation, is 55 to 60%. The  left ventricle has normal function. The left ventricle has no regional  wall motion abnormalities. There is mild left ventricular hypertrophy of  the basal-septal segment. Left ventricular diastolic parameters are consistent with Grade I diastolic dysfunction (impaired relaxation).   2. Right ventricular systolic function is normal. The right ventricular  size is normal. There is normal pulmonary artery systolic pressure.   3. Left atrial size was mildly dilated.   4. The mitral valve is normal in structure. Trivial  mitral valve  regurgitation. No evidence of mitral stenosis.   5. The aortic valve is tricuspid. Aortic valve regurgitation is not  visualized. No aortic stenosis is present.   6. The inferior vena cava is normal in size with greater than 50%  respiratory variability, suggesting  right atrial pressure of 3 mmHg.   Epic records are reviewed at length today  CHA2DS2-VASc Score = 5  The patient's score is based upon: CHF History: 0 HTN History: 1 Diabetes History: 0 Stroke History: 0 Vascular Disease History: 1 Age Score: 2 Gender Score: 1        ASSESSMENT AND PLAN: Paroxysmal Atrial Fibrillation (ICD10:  I48.0) The patient's CHA2DS2-VASc score is 5, indicating a 7.2% annual risk of stroke.   Patient appears to be maintaining SR. Could consider Multaq, dofetilide, or ablation if further rhythm control is needed.  Continue Eliquis  5 mg BID Continue diltiazem  30 mg q 4 hours for heart racing. Recall she did not tolerate scheduled CCB.   Secondary Hypercoagulable State (ICD10:  D68.69) The patient is at significant risk for stroke/thromboembolism based upon her CHA2DS2-VASc Score of 5.  Continue Apixaban  (Eliquis ).   HTN Stable on current regimen  OSA  Encouraged nightly CPAP  CAD No anginal symptoms Followed by Dr Mona   Follow up in the AF clinic in one year.    Daril Kicks PA-C [facility] [address] [place], [place] [address] [phone] 08/29/2023 11:29 AM

## 2023-10-05 ENCOUNTER — Other Ambulatory Visit: Payer: Self-pay | Admitting: Internal Medicine

## 2023-10-05 DIAGNOSIS — I480 Paroxysmal atrial fibrillation: Secondary | ICD-10-CM

## 2023-10-06 NOTE — Telephone Encounter (Signed)
 Prescription refill request for Eliquis  received. Indication:afib Last office visit:6/25 Scr:0.62  10/24 Age: 78 Weight:68.4  kg  Prescription refilled

## 2023-10-18 ENCOUNTER — Emergency Department (HOSPITAL_COMMUNITY)

## 2023-10-18 ENCOUNTER — Emergency Department (HOSPITAL_COMMUNITY)
Admission: EM | Admit: 2023-10-18 | Discharge: 2023-10-18 | Disposition: A | Discharge status: Home / Self Care | Attending: Emergency Medicine | Admitting: Emergency Medicine

## 2023-10-18 ENCOUNTER — Encounter (HOSPITAL_COMMUNITY): Payer: Self-pay | Admitting: *Deleted

## 2023-10-18 ENCOUNTER — Other Ambulatory Visit: Payer: Self-pay

## 2023-10-18 DIAGNOSIS — Z7901 Long term (current) use of anticoagulants: Secondary | ICD-10-CM | POA: Insufficient documentation

## 2023-10-18 DIAGNOSIS — E871 Hypo-osmolality and hyponatremia: Secondary | ICD-10-CM | POA: Insufficient documentation

## 2023-10-18 DIAGNOSIS — R1084 Generalized abdominal pain: Secondary | ICD-10-CM | POA: Diagnosis present

## 2023-10-18 LAB — COMPREHENSIVE METABOLIC PANEL WITH GFR
ALT: 23 U/L (ref 0–44)
AST: 32 U/L (ref 15–41)
Albumin: 4 g/dL (ref 3.5–5.0)
Alkaline Phosphatase: 66 U/L (ref 38–126)
Anion gap: 17 — ABNORMAL HIGH (ref 5–15)
BUN: 8 mg/dL (ref 8–23)
CO2: 19 mmol/L — ABNORMAL LOW (ref 22–32)
Calcium: 8.7 mg/dL — ABNORMAL LOW (ref 8.9–10.3)
Chloride: 90 mmol/L — ABNORMAL LOW (ref 98–111)
Creatinine, Ser: 0.53 mg/dL (ref 0.44–1.00)
GFR, Estimated: >60 mL/min (ref >60)
Glucose, Bld: 96 mg/dL (ref 70–99)
Potassium: 3.8 mmol/L (ref 3.5–5.1)
Sodium: 126 mmol/L — ABNORMAL LOW (ref 135–145)
Total Bilirubin: 1.1 mg/dL (ref 0.0–1.2)
Total Protein: 7.3 g/dL (ref 6.5–8.1)

## 2023-10-18 LAB — URINALYSIS, ROUTINE W REFLEX MICROSCOPIC
Bilirubin Urine: NEGATIVE
Glucose, UA: NEGATIVE mg/dL
Ketones, ur: 20 mg/dL — AB
Nitrite: NEGATIVE
Protein, ur: NEGATIVE mg/dL
Specific Gravity, Urine: 1.003 — ABNORMAL LOW (ref 1.005–1.030)
pH: 5 (ref 5.0–8.0)

## 2023-10-18 LAB — CBC WITH DIFFERENTIAL/PLATELET
Abs Immature Granulocytes: 0.01 K/uL (ref 0.00–0.07)
Basophils Absolute: 0 K/uL (ref 0.0–0.1)
Basophils Relative: 0 %
Eosinophils Absolute: 0 K/uL (ref 0.0–0.5)
Eosinophils Relative: 0 %
HCT: 40.3 % (ref 36.0–46.0)
Hemoglobin: 14.4 g/dL (ref 12.0–15.0)
Immature Granulocytes: 0 %
Lymphocytes Relative: 23 %
Lymphs Abs: 1.2 K/uL (ref 0.7–4.0)
MCH: 31.6 pg (ref 26.0–34.0)
MCHC: 35.7 g/dL (ref 30.0–36.0)
MCV: 88.6 fL (ref 80.0–100.0)
Monocytes Absolute: 0.3 K/uL (ref 0.1–1.0)
Monocytes Relative: 6 %
Neutro Abs: 3.7 K/uL (ref 1.7–7.7)
Neutrophils Relative %: 71 %
Platelets: 208 K/uL (ref 150–400)
RBC: 4.55 MIL/uL (ref 3.87–5.11)
RDW: 11.9 % (ref 11.5–15.5)
WBC: 5.3 K/uL (ref 4.0–10.5)
nRBC: 0 % (ref 0.0–0.2)

## 2023-10-18 LAB — LIPASE, BLOOD: Lipase: 35 U/L (ref 11–51)

## 2023-10-18 MED ORDER — IOHEXOL 350 MG/ML SOLN
75.0000 mL | Freq: Once | INTRAVENOUS | Status: AC | PRN
  Administered 2023-10-18: 75 mL via INTRAVENOUS

## 2023-10-18 MED ORDER — SODIUM CHLORIDE 0.9 % IV BOLUS
1000.0000 mL | Freq: Once | INTRAVENOUS | Status: AC
  Administered 2023-10-18: 1000 mL via INTRAVENOUS

## 2023-10-18 NOTE — Discharge Instructions (Addendum)
 It was our pleasure to provide your ER care today - we hope that you feel better. Drink plenty of fluids/stay well hydrated - as we discussed, your sodium level is low on today's labs - make sure that if/when drinking fluids, to drink fluids with sodium/electrolytes in them and avoid excessive free water ingestion. Also get adequate sodium in diet. Follow up with your doctor, or here/urgent care this Monday for recheck of labs/sodium.   Follow up closely with primary care doctor/gi doctor in the coming week - see attached info - call office Monday AM to arrange follow up appointment and recheck of sodium level.   Return to ER if worse, new symptoms, fevers, new or severe or worsening abdominal pain, persistent vomiting, chest pain, trouble breathing, or other concern.

## 2023-10-18 NOTE — ED Provider Triage Note (Signed)
 Emergency Medicine Provider Triage Evaluation Note  Madison Nunez , a 78 y.o. female  was evaluated in triage.  Pt complains of abdominal pain. She reports ongoing abdominal pain she describes as a burning sensation for several weeks across the entire abdomen. No vomiting or diarrhea. Denies any sick contacts. States that she is concerned that her Repatha  is causing these symptoms. No reported chest pain or shortness of breath.  Review of Systems  Positive: As above Negative: As above  Physical Exam  BP (!) 159/82 (BP Location: Right Arm)   Pulse (!) 103   Temp 99 F (37.2 C) (Oral)   Resp 18   Ht 5' 4 (1.626 m)   Wt 68.4 kg   LMP 02/19/2011   SpO2 99%   BMI 25.88 kg/m  Gen:   Awake, no distress   Resp:  Normal effort  MSK:   Moves extremities without difficulty  Other:  Generalized abdominal tenderness, no abnormal bowel sounds  Medical Decision Making  Medically screening exam initiated at 3:37 PM.  Appropriate orders placed.  Madison Nunez was informed that the remainder of the evaluation will be completed by another provider, this initial triage assessment does not replace that evaluation, and the importance of remaining in the ED until their evaluation is complete.     Nell Schrack A, PA-C 10/18/23 1539

## 2023-10-18 NOTE — ED Provider Notes (Signed)
 Care transferred to me.  Patient is feeling well.  No abdominal pain.  Will discharge as per prior plan with Dr. Bernard.  Fluids have finished.   Freddi Hamilton, MD 10/18/23 [phone]

## 2023-10-18 NOTE — ED Notes (Signed)
 Pt ambulated to the bathroom. Pt has a steady gait and is able to ambulate independently.

## 2023-10-18 NOTE — ED Provider Notes (Signed)
 [place] EMERGENCY DEPARTMENT AT [facility] Provider Note   CSN: [id number] Arrival date & time: 10/18/23  1503     Patient presents with: Abdominal Pain   Madison Nunez is a 78 y.o. female.   Pt with c/o general abdominal discomfort/cramping in the past several days. No constant and/or focal pain. No radiation of pain. No specific exacerbating or alleviating factors. Is eating and drinking. Indicates has had similar symptoms for 'long while' - not sure of cause. Remote hx appendectomy. Is having regular bms. No vomiting. No dysuria or gu c/o. No back or flank pain. No fever or chills.   The history is provided by the patient and medical records.  Abdominal Pain Associated symptoms: no chest pain, no chills, no cough, no diarrhea, no dysuria, no fever, no nausea, no shortness of breath, no sore throat, no vaginal bleeding, no vaginal discharge and no vomiting        Prior to Admission medications   Medication Sig Start Date End Date Taking? Authorizing Provider  ascorbic Acid (VITAMIN C) 500 MG CPCR Take 500 mg by mouth daily.    [provider]  B Complex Vitamins (VITAMIN B COMPLEX) TABS Take 0.5-1 tablets by mouth in the morning.    [provider]  Cholecalciferol  (VITAMIN D3) 50 MCG (2000 UT) TABS Take 2,000 Units by mouth daily.    [provider]  diltiazem  (CARDIZEM ) 30 MG tablet TAKE 1 TABLET BY MOUTH EVERY 4 HOURS AS NEEDED FOR HEART RATES OVER 100 01/13/23   Fenton, Clint R, PA  ELIQUIS  5 MG TABS tablet TAKE 1 TABLET(5 MG) BY MOUTH TWICE DAILY 10/06/23   Hilty, Vinie BROCKS, MD  Evolocumab  (REPATHA  SURECLICK) 140 MG/ML SOAJ Inject 140 mg into the skin every 14 (fourteen) days. 02/13/23   Hilty, Vinie BROCKS, MD  Flaxseed Oil OIL Take 1,300 mg by mouth in the morning and at bedtime.    [provider]  latanoprost (XALATAN) 0.005 % ophthalmic solution Place 1 drop into both eyes at bedtime. 06/25/19   [provider]   levothyroxine  (SYNTHROID , LEVOTHROID) 50 MCG tablet Take 50 mcg by mouth daily.    [provider]  lisinopril  (PRINIVIL ,ZESTRIL ) 40 MG tablet Take 1 tablet (40 mg total) by mouth daily. 10/14/12   Armanda Standing, MD  Multiple Vitamins-Minerals (CENTRUM SILVER 50+WOMEN) TABS Take 1 tablet by mouth daily with breakfast.    [provider]    Allergies: Hctz [hydrochlorothiazide ], Beta adrenergic blockers, Prednisone, Pravastatin, and Carvedilol     Review of Systems  Constitutional:  Negative for chills and fever.  HENT:  Negative for sore throat.   Respiratory:  Negative for cough and shortness of breath.   Cardiovascular:  Negative for chest pain and leg swelling.  Gastrointestinal:  Positive for abdominal pain. Negative for blood in stool, diarrhea, nausea and vomiting.  Genitourinary:  Negative for dysuria, flank pain, pelvic pain, vaginal bleeding and vaginal discharge.  Musculoskeletal:  Negative for back pain.  Neurological:  Negative for headaches.    Updated Vital Signs BP (!) 146/77   Pulse 81   Temp 97.9 F (36.6 C) (Oral)   Resp 20   Ht 1.626 m (5' 4)   Wt 68.4 kg   LMP 02/19/2011   SpO2 100%   BMI 25.88 kg/m   Physical Exam Vitals and nursing note reviewed.  Constitutional:      Appearance: Normal appearance. She is well-developed.  HENT:     Head: Atraumatic.  Nose: Nose normal.     Mouth/Throat:     Mouth: Mucous membranes are moist.     Pharynx: Oropharynx is clear. No oropharyngeal exudate or posterior oropharyngeal erythema.  Eyes:     General: No scleral icterus.    Conjunctiva/sclera: Conjunctivae normal.  Neck:     Trachea: No tracheal deviation.  Cardiovascular:     Rate and Rhythm: Normal rate and regular rhythm.     Pulses: Normal pulses.     Heart sounds: Normal heart sounds. No murmur heard.    No friction rub. No gallop.  Pulmonary:     Effort: Pulmonary effort is normal. No respiratory distress.     Breath sounds:  Normal breath sounds.  Abdominal:     General: Bowel sounds are normal. There is no distension.     Palpations: Abdomen is soft. There is no mass.     Tenderness: There is no abdominal tenderness. There is no guarding or rebound.     Hernia: No hernia is present.  Genitourinary:    Comments: No cva tenderness.  Musculoskeletal:        General: No swelling or tenderness.     Cervical back: Normal range of motion and neck supple. No rigidity. No muscular tenderness.     Right lower leg: No edema.     Left lower leg: No edema.     Comments: Good rom bil legs without pain or focal bony tenderness. No leg edema. Bil legs of normal color and warmth with intact distal pulses.   Skin:    General: Skin is warm and dry.     Findings: No rash.  Neurological:     Mental Status: She is alert.     Comments: Alert, speech normal. Motor/sens grossly intact bil.   Psychiatric:        Mood and Affect: Mood normal.     (all labs ordered are listed, but only abnormal results are displayed) Results for orders placed or performed during the hospital encounter of 10/18/23  CBC with Differential   Collection Time: 10/18/23  3:55 PM  Result Value Ref Range   WBC 5.3 4.0 - 10.5 K/uL   RBC 4.55 3.87 - 5.11 MIL/uL   Hemoglobin 14.4 12.0 - 15.0 g/dL   HCT 59.6 63.9 - 53.9 %   MCV 88.6 80.0 - 100.0 fL   MCH 31.6 26.0 - 34.0 pg   MCHC 35.7 30.0 - 36.0 g/dL   RDW 88.0 88.4 - 84.4 %   Platelets 208 150 - 400 K/uL   nRBC 0.0 0.0 - 0.2 %   Neutrophils Relative % 71 %   Neutro Abs 3.7 1.7 - 7.7 K/uL   Lymphocytes Relative 23 %   Lymphs Abs 1.2 0.7 - 4.0 K/uL   Monocytes Relative 6 %   Monocytes Absolute 0.3 0.1 - 1.0 K/uL   Eosinophils Relative 0 %   Eosinophils Absolute 0.0 0.0 - 0.5 K/uL   Basophils Relative 0 %   Basophils Absolute 0.0 0.0 - 0.1 K/uL   Immature Granulocytes 0 %   Abs Immature Granulocytes 0.01 0.00 - 0.07 K/uL  Comprehensive metabolic panel   Collection Time: 10/18/23  3:55 PM   Result Value Ref Range   Sodium 126 (L) 135 - 145 mmol/L   Potassium 3.8 3.5 - 5.1 mmol/L   Chloride 90 (L) 98 - 111 mmol/L   CO2 19 (L) 22 - 32 mmol/L   Glucose, Bld 96 70 - 99 mg/dL  BUN 8 8 - 23 mg/dL   Creatinine, Ser 9.46 0.44 - 1.00 mg/dL   Calcium  8.7 (L) 8.9 - 10.3 mg/dL   Total Protein 7.3 6.5 - 8.1 g/dL   Albumin 4.0 3.5 - 5.0 g/dL   AST 32 15 - 41 U/L   ALT 23 0 - 44 U/L   Alkaline Phosphatase 66 38 - 126 U/L   Total Bilirubin 1.1 0.0 - 1.2 mg/dL   GFR, Estimated >39 >39 mL/min   Anion gap 17 (H) 5 - 15  Lipase, blood   Collection Time: 10/18/23  3:55 PM  Result Value Ref Range   Lipase 35 11 - 51 U/L  Urinalysis, Routine w reflex microscopic -Urine, Clean Catch   Collection Time: 10/18/23  5:21 PM  Result Value Ref Range   Color, Urine STRAW (A) YELLOW   APPearance CLEAR CLEAR   Specific Gravity, Urine 1.003 (L) 1.005 - 1.030   pH 5.0 5.0 - 8.0   Glucose, UA NEGATIVE NEGATIVE mg/dL   Hgb urine dipstick MODERATE (A) NEGATIVE   Bilirubin Urine NEGATIVE NEGATIVE   Ketones, ur 20 (A) NEGATIVE mg/dL   Protein, ur NEGATIVE NEGATIVE mg/dL   Nitrite NEGATIVE NEGATIVE   Leukocytes,Ua TRACE (A) NEGATIVE   RBC / HPF 0-5 0 - 5 RBC/hpf   WBC, UA 0-5 0 - 5 WBC/hpf   Bacteria, UA RARE (A) NONE SEEN   Squamous Epithelial / HPF 0-5 0 - 5 /HPF   Mucus PRESENT      EKG: None  Radiology: CT ABDOMEN PELVIS W CONTRAST Result Date: 10/18/2023 CLINICAL DATA:  Abdominal pain EXAM: CT ABDOMEN AND PELVIS WITH CONTRAST TECHNIQUE: Multidetector CT imaging of the abdomen and pelvis was performed using the standard protocol following bolus administration of intravenous contrast. RADIATION DOSE REDUCTION: This exam was performed according to the departmental dose-optimization program which includes automated exposure control, adjustment of the mA and/or kV according to patient size and/or use of iterative reconstruction technique. CONTRAST:  75mL OMNIPAQUE  IOHEXOL  350 MG/ML SOLN  COMPARISON:  02/28/2021 FINDINGS: Lower chest: No acute findings Hepatobiliary: No focal hepatic abnormality. Gallbladder unremarkable. Pancreas: No focal abnormality or ductal dilatation. Spleen: No focal abnormality.  Normal size. Adrenals/Urinary Tract: No adrenal abnormality. No focal renal abnormality. No stones or hydronephrosis. Urinary bladder is unremarkable. Stomach/Bowel: Stomach, large and small bowel grossly unremarkable. Vascular/Lymphatic: Aortic atherosclerosis. No evidence of aneurysm or adenopathy. Reproductive: Uterus and adnexa unremarkable.  No mass. Other: No free fluid or free air. Musculoskeletal: No acute bony abnormality. IMPRESSION: No acute findings in the abdomen or pelvis. Aortic atherosclerosis. Electronically Signed   By: Franky Crease M.D.   On: 10/18/2023 19:23     Procedures   Medications Ordered in the ED  sodium chloride  0.9 % bolus 1,000 mL (has no administration in time range)  sodium chloride  0.9 % bolus 1,000 mL (1,000 mLs Intravenous New Bag/Given 10/18/23 1752)  iohexol  (OMNIPAQUE ) 350 MG/ML injection 75 mL (75 mLs Intravenous Contrast Given 10/18/23 1905)                                    Medical Decision Making Problems Addressed: Generalized abdominal pain: acute illness or injury with systemic symptoms    Details: Acute/chronic Hyponatremia: acute illness or injury  Amount and/or Complexity of Data Reviewed External Data Reviewed: labs and notes. Labs: ordered. Decision-making details documented in ED Course. Radiology: ordered and independent interpretation performed.  Decision-making details documented in ED Course.  Risk Decision regarding hospitalization.   Iv ns. Continuous pulse ox and cardiac monitoring. Labs ordered/sent. Imaging ordered.   Differential diagnosis includes gastritis, pud, pancreatitis, gallstones, etc. Dispo decision including potential need for admission considered - will get labs and imaging and reassess.    Reviewed nursing notes and prior charts for additional history. External reports reviewed.   Cardiac monitor: sinus rhythm, rate 85.  Labs reviewed/interpreted by me - wbc and hgb normal. Na 126, low, hc03 mildly low, ?dehydration,  ns bolus. Urine neg for uti, small ketones. Ivf.  Pt indicates she has recently been drinking 80-100 ounces of water a day. States many years ago had episode where sodium got low, but indicates was on a diuretic med then. Currently denies diuretic use, or any change in meds.   CT reviewed/interpreted by me - no infection or acute process. Abd soft non tender.   Additional ns bolus. Discussed na, and need for close f/u. Also discussed avoiding excessive free water, and more fluids with sodium electrolytes.   2110, pt receiving ivf, discussed/signed out to Dr Freddi plan of ivf, then probable d/c w close pcp f/u in 1 days time for recheck labs.        Final diagnoses:  Generalized abdominal pain  Hyponatremia    ED Discharge Orders     None          Bernard Drivers, MD 10/18/23 2114

## 2023-11-18 NOTE — Progress Notes (Signed)
 11/18/2023   [facility]     Impression/ Plan    1. Annual physical exam      2. Refusal of statin medication by patient      3. PAF (paroxysmal atrial fibrillation) (*)      4. Primary hypertension  CBC And Differential   Comprehensive Metabolic Panel   Lipid Panel   Urinalysis    5. Hypercoagulable state due to paroxysmal atrial fibrillation (*)      6. Acquired hypothyroidism  TSH+Free T4       Assessment & Plan 1. Hypercholesterolemia: - She is currently taking Repatha  for her high cholesterol, which was started by her cardiologist on 03/22/2023. - Her cholesterol level is 173, with a slightly elevated LDL. - She reports that Repatha  makes her feel unsteady and affects her legs. - A lipid panel will be ordered to monitor her cholesterol levels.  2. Hypertension: - Her blood pressure readings at home average around 120/70, although today's reading was slightly elevated at 140/70. - She experiences white coat hypertension, which may contribute to higher readings in the office. - Blood pressure will continue to be monitored.  3. Atrial Fibrillation: - She takes diltiazem  30 mg only if she experiences A-fib episodes, which have been infrequent. - She is also on Eliquis , which she took an hour before the visit. - She reports that her electrolytes were previously low, potentially triggering minor A-fib episodes, but she has been managing this with increased salt intake and a product called Liquid I.V. - Blood work will be ordered to check her electrolyte levels.  4. Fall Risk: - She reports feeling unsteady and fears falling, especially while doing outdoor activities like yard work. - Physical therapy for fall risk management will be initiated at [facility] in [place].  5. Adverse Reaction to RSV Vaccine: - She experienced a significant reaction to the RSV vaccine administered last Tuesday, including a large red reaction,  bruising, and itching. - The reaction is settling down, but she prefers to hold off on the flu shot until it resolves. It is expected to take a couple of weeks for the reaction to subside.  6. Weight Loss: - She has lost 9 pounds since September 2024, which she attributes to a decreased appetite and reduced food intake. - She considers the weight loss beneficial and plans to continue her current dietary habits.  7. Ear Wax Accumulation: - She has a little more wax on one side, but it is not impacted. - The ear will be rinsed out today.  Follow-up: A follow-up visit is scheduled for 05/2024 for her wellness check.    Patient instructions and education given as below.  Labs ordered today and I will follow up with the patient with any additional recommendations once the results are available.    Healthy lifestyle recommendations discussed. Wellness handout given. Patient encouraged to exercise, monitor healthy diet, reduce stress when able, and take time for themselves.  Advised yearly pelvic exams, with Pap according ACOG guidelines per age, dental exams every 6 months, yearly eye exams, and dermatology exam as needed. Advise monthly self breast exams, yearly Mammogram if 40 or at risk, annual early flu immunization, colonoscopy at the age of 14, Zostavax at 50, and Pnuemovax at 10. Follow-up yearly for physical and labs unless concerns arise sooner and as recommended for chronic medical problems.  Risks, benefits, and alternatives of the medications and treatment plan prescribed today were discussed, and patient expressed understanding. Plan  follow-up as discussed or as needed if any new symptoms or change in chronic conditions.    No follow-ups on file. Subjective   Patient ID:  Madison Nunez is a 78 y.o. (DOB [birth year]) female.     Patient presents with  . Annual Exam    Patient presents for wellness visit today. The interval history is unremarkable. Patient history, meds, immunizations,  and problem list have been reviewed and discussed. Patient given opportunity to voice concerns.  History of Present Illness The patient presents for a physical exam.  She has been on Repatha  since 03/2023 for high cholesterol. While she finds it effective, it causes leg weakness, raising concerns about the risk of falls. She also mentions not lifting her left foot sufficiently.  Last Tuesday, she experienced a significant reaction to the RSV vaccine, including bruising and itching. She plans to delay her influenza vaccine until these symptoms resolve. Typically, she receives her influenza vaccine in October.  She monitors her blood pressure daily at home, with an average reading of 120/70. However, her systolic pressure can range from 110 to 165, and her diastolic pressure from 70 to 76 when she is active or stressed. She sometimes checks her blood pressure three to four times a day to ensure stability.  She has been experiencing intermittent atrial fibrillation, which she believes may be related to her salt intake. She has increased her salt consumption and started using Liquid I.V., which she finds beneficial. She suspects that low electrolyte levels were triggering minor atrial fibrillation episodes. She measures her salt intake daily and drinks plenty of water.  She reports no difficulty swallowing but has noticed weight loss over time, attributing it to a decreased appetite. She reports regular bowel movements but has some gastric issues and no swelling.  She thinks she may have impacted wax in her ear and has decreased hearing.  Hobbies: Enjoys outdoor activities and yard work Diet: Increased salt intake, drinks plenty of water Alcohol: Does not consume alcohol Tobacco: Smoked in the past, quit in the 1970s Recreational Drugs: Does not use recreational drugs Living Condition: Lives a couple of miles from the hospital in [place]  Results Labs  - NMR Lipoprotein Panel: Cholesterol: 173  mg/dL, LDL: slightly elevated  Allergies[1]  Current Outpatient Medications  Medication Instructions  . apixaban  (ELIQUIS ) 5 mg, 2 times a day  . ascorbic Acid (VITAMIN C) 500 mg, Daily  . B Complex Vitamins (VITAMIN B COMPLEX) 1 tablet, Daily  . Cholecalciferol  50 MCG (2000 UT) TABS 1 tablet, Daily  . diltiazem  (CARDIZEM ) 30 mg, As needed  . Flaxseed Oil (LINSEED OIL) OIL 1,300 mg, 2 times a day  . latanoprost (XALATAN) 0.005% ophthalmic solution INT 1 GTT INTO OU HS  . levothyroxine  sodium (SYNTHROID ,LEVOTHROID,LOVOXYL) 50 mcg, Oral, Daily  . lisinopril  (PRINIVIL ,ZESTRIL ) 40 mg, Oral, Daily, for blood pressure  . Multiple Vitamins-Minerals (CENTRUM SILVER 50+WOMEN) TABS 1 tablet, Daily with breakfast  . REPATHA  SURECLICK 140 mg, Every 14 days   Past Medical History:  Diagnosis Date  . Anxiety   . Atrial fibrillation (*)   . Breast cancer (*) 2005  . Constipation 05/24/2011  . GERD (gastroesophageal reflux disease)   . Hyperlipidemia   . Hypertension   . Mitral valve disorders(424.0)   . Open-angle glaucoma 08/22/2016  . Osteoarthritis, multiple sites 06/26/2010   Osteoarthritis   . 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 Thyroid  disease  Past Surgical History:  Procedure Laterality Date  . Appendectomy  03/05/1955   [facility] [place]  . Breast surgery  03/05/2003   Right Breast [facility]  . Eye surgery  2017   Cataracts  . Lymph node biopsy  2005   breast cancer  . Other surgical history     History of Breast Surgery Lumpectomy - [facility] breast [facility]   Social History[2]  Family History  Problem Relation Age of Onset  . Alzheimer's disease Mother   . Kidney disease Mother   . Heart disease Father 33       MI, type 1 diabetic  . Diabetes Father        type 1 diabetic  . Early death Father   . Cancer Other        breast, great grandmother   . Heart disease Brother   . Early death Brother   . Diabetes Brother   . Heart disease Paternal Uncle   . Cancer Maternal Grandmother   . Arthritis Paternal Grandfather   . Stroke Paternal Grandmother    Immunization History  Administered Date(s) Administered  . Influenza High Dose 12/13/2015, 12/21/2018  . Influenza Quad 0.12ml single-dose vial(Fluzone) 12/22/2014  . Influenza Tri 03/04/2009, 12/31/2010  . Influenza vaccine, quadrivalent, adjuvanted(Fluad) 02/01/2022  . Influenza, MDCK, trivalent, PF (Flucelvax PF) 11/14/2022  . Influenza, high-dose seasonal, quadrivalent, .7mL dose, preservative free(Fluzone) 12/21/2018  . Pfizer COVID-19 12+ years (Omicron KP.2, Omicron XBB.1.5) 02/18/2022  . Pfizer COVID-19 Bivalent 12+yr 01/07/2021  . Pfizer COVID-19 Vaccine Monovalent 12+ [place] 06/19/2020  . Pfizer-BioNTech COVID-19 Monovalent Vaccine mRNA 07/17/2019, 08/07/2019, 02/19/2020  . Pneumococcal Conjugate 13 (Prevnar) 06/23/2014  . Pneumococcal Polysaccharide (Pneumovax) 02/20/2011  . RSV BV RSVpreF DILUENT  RECON 0.5 ML, PF 11/11/2023  . Tdap 03/04/2004  . Zoster (Zostavax) 07/06/2010   Health Maintenance  Topic Date Due  . Zoster Vaccine (2 of 3) 08/31/2010  . DTaP/Tdap/Td Vaccines (2 - Td or Tdap) 03/04/2014  . TSH Level  11/14/2023  . Influenza Vaccine (1) 08/31/2024 (Originally 11/03/2023)  . Creatinine Level  05/13/2024  . Potassium Level  05/13/2024  . Medicare Annual Wellness  05/13/2024  . DEXA Scan  06/07/2026  . Pneumococcal Vaccine: 50+ Years  Completed  . RSV Adult and Pregnancy  Completed  . Meningococcal B Vaccine  Aged Out  . Hepatitis A Vaccine  Aged Out  . Meningococcal Conjugate Vaccine  Aged Out  . Colorectal Cancer Screening  Discontinued  . Mammogram  Discontinued   DEPRESSION SCREEN Patient Health Questionnaire    Falls Risk Screening  More data exists      05/11/2023  Falls Risk Assessment  Patient can ambulate Yes  Do you feel unsteady when  standing or walking? Yes*  Do you worry about falling? Yes*  Have you fallen in the past year? No      Review of Systems  Constitutional: Negative for chills, fever and weight loss.  HENT: Negative for congestion and ear pain.   Eyes: Negative for double vision and discharge.  Respiratory: Negative for cough and shortness of breath.   Cardiovascular: Negative for chest pain and leg swelling.  Breasts: no new or changing breast lumps, nipple changes or nipple discharge  Gastrointestinal: Negative for abdominal pain, blood in stool and stool changes.  Genitourinary: Negative for dysuria and hematuria.  Musculoskeletal: Negative for myalgias.  Skin: Negative for rash.  Neurological: Negative for speech change and headaches.  Endo/Heme/Allergies:  Does not bruise/bleed easily.  Psychiatric/Behavioral: Negative for depression and memory loss.  All other systems reviewed and are negative.  Objective  BP 120/70 Comment: at home  Pulse 59   Temp 97.4 F (36.3 C) (Temporal)   Resp 16   Ht 5' 4 (1.626 m)   Wt 141 lb (64 kg)   SpO2 99%   Breastfeeding No   BMI 24.20 kg/m  Wt Readings from Last 3 Encounters:  11/18/23 141 lb (64 kg)  05/14/23 144 lb (65.3 kg)  11/25/22 150 lb (68 kg)    Physical Exam Ears: Increased wax in the right ear, not impacted.   General: The patient is a 78 y.o. female who appears to be in no acute distress. Psych: She is alert and oriented to person, place, and time. Her mood and affect are normal. HEENT: Normocephalic, atraumatic, non-icteric sclera, PERRL.  Tympanic membranes are without perforation or infection.  Nasopharynx is grossly normal.  Oropharynx is without mass or exudate. Neck:  Supple.  Trachea is in midline position.  The neck is without adenopathy, masses, or thyromegaly. Lungs:  Good breath sounds are noted bilaterally.  The lungs are clear to auscultation and percussion bilaterally.  The spine and CVA region are  nontender to  palpation. Cardio:  Regular rate and rhythm without gallop, rub, or murmur.  Abdomen:  Bowel sounds are physiologic.  The abdomen is soft and nontender to palpation.  No masses are noted.  No hepatosplenomegaly is noted.  Skin:  No rashes or worrisome lesions are noted.  Extremities:  The extremities are without cyanosis or significant contusions.  Pitting edema is not noted.  ROM is normal in all four extremities. Pulses: Adequate pulses are noted in all four extremities and both carotid arteries. Neuro:  Mental status is normal.  CN 2-11 are grossly normal.  Motor and sensory exams are grossly normal. Gait is stable.    Alm FORBES Bilis, MD 11/18/2023, 11:30 AM   Medication list was reviewed, updated, and a copy provided to patient upon leaving.                [1] Allergies Allergen Reactions  . Beta Adrenergic Blockers Other    Metoprolol  and carvedilol  have stopped working for patient over time, causing nausea also  . Hctz [Hydrochlorothiazide ] Other    Hyponatremia, hospitalized 118  . Prednisone Other    Had reaction with use prior to chemo  . Carvedilol  Nausea Only    Tolerates other beta-blockers  [2] Social History Socioeconomic History  . Marital status: Divorced  . Number of children: 0  Occupational History  . Occupation: Conservation officer, nature, Systems developer  . Occupation: Hobbies: garden and reading  Tobacco Use  . Smoking status: Former    Current packs/day: 0.00    Average packs/day: 1 pack/day for 8.0 years (8.0 ttl pk-yrs)    Types: Cigarettes    Start date: 03/04/1966    Quit date: 03/04/1974    Years since quitting: 49.7    Passive exposure: Past  . Smokeless tobacco: Never  Vaping Use  . Vaping status: Never Used  Substance and Sexual Activity  . Alcohol use: Not Currently    Alcohol/week: 7.0 standard drinks of alcohol    Types: 7 Glasses of wine per week    Comment: wine with dinner  . Drug use: Never  . Sexual activity: Not Currently    Partners:  Male    Birth control/protection: Surgical, None    Comment: husband had vasectomy  Social History Narrative   Marital History - Divorced   Working Full Time   Being A Social Drinker   Denied - History of Smoking Cigarettes   Sexual Orientation Heterosexual  *Some images could not be shown.

## 2023-12-03 ENCOUNTER — Ambulatory Visit: Admitting: Podiatry

## 2023-12-08 ENCOUNTER — Ambulatory Visit: Admitting: Podiatry

## 2023-12-08 ENCOUNTER — Encounter: Payer: Self-pay | Admitting: Podiatry

## 2023-12-08 DIAGNOSIS — M6740 Ganglion, unspecified site: Secondary | ICD-10-CM

## 2023-12-08 DIAGNOSIS — M79674 Pain in right toe(s): Secondary | ICD-10-CM

## 2023-12-08 DIAGNOSIS — M79675 Pain in left toe(s): Secondary | ICD-10-CM | POA: Diagnosis not present

## 2023-12-08 DIAGNOSIS — B351 Tinea unguium: Secondary | ICD-10-CM | POA: Diagnosis not present

## 2023-12-08 DIAGNOSIS — D6869 Other thrombophilia: Secondary | ICD-10-CM

## 2023-12-08 NOTE — Progress Notes (Signed)
  Subjective:  Patient ID: Madison Nunez, female    DOB: 10-08-1945,   MRN: [id number]  Chief Complaint  Patient presents with   Nail Problem    I need my toenails clipped.  I have a lump on top of my left foot.    78 y.o. female presents for concern of thickened elongated and painful nails that are difficult to trim. Requesting to have them trimmed today. Concern for lump on the top of her left foot as well. She is taking eliquis  and at risk for foot care.   Relates the lump has been present for several weeks. She denies any pain but noticed it popped up near a vein and was concerned. Denies any other lesions like this anywhere else in the body. She denies any treatments.      PCP:  Pura Lenis, MD    . Denies any other pedal complaints. Denies n/v/f/c.   Past Medical History:  Diagnosis Date   Arthritis    hands and lower back   Atrial fibrillation [facility])    Per patient   Breast cancer [facility]) 2005   right lumpectomy, chemotherapy with A/C combo  & Taxol, mammogram 02/06/11 without recurrence   Dyspnea    Echocardiogram 02/20/11: EF 60-65%.;  ETT-myoveiw 12/12 normal with EF 80%   Hypertension    Hypothyroidism    Personal history of chemotherapy 10/2003   Personal history of radiation therapy 03/2004    Objective:  Physical Exam: Vascular: DP/PT pulses 2/4 bilateral. CFT <3 seconds. Normal hair growth on digits. No edema.  Skin. No lacerations or abrasions bilateral feet. Nails 1-5 bilateral are thickened elongated and with subungual debris. Soft tissue mass abou 0.5 cm on the dorsum of the left foot. Flucatnat and fluid filled but mostly firm. Non mobile.  Musculoskeletal: MMT 5/5 bilateral lower extremities in DF, PF, Inversion and Eversion. Deceased ROM in DF of ankle joint. Non tender to left medial calcaneal tubercle. No pain to achilles PT or arch. No pain with calcaneal squeeze.  Neurological: Sensation intact to light touch. Negative tinels.   Assessment:   1.  Pain due to onychomycosis of toenails of both feet   2. Secondary hypercoagulable state   3. Ganglion cyst         Plan:  Patient was evaluated and treated and all questions answered. -Doing well with plantar fasciitis, getting NCV with neuropathy to see if she has any neuropathy.  -Mechanically debrided all nails 1-5 bilateral using sterile nail nipper and filed with dremel without incident  -Answered all patient questions -Patient to return  in 3 months for at risk foot care Discussed ganglion cysts and treatment options with the patient. Discussed benign lesion and can likely watch and see how things go as long as does not grow or become painful.  Discussed aspiration if needed in the future.      Asberry Failing, DPM

## 2024-02-09 ENCOUNTER — Other Ambulatory Visit: Payer: Self-pay | Admitting: Internal Medicine

## 2024-03-10 ENCOUNTER — Other Ambulatory Visit: Payer: Self-pay | Admitting: *Deleted

## 2024-03-10 DIAGNOSIS — E785 Hyperlipidemia, unspecified: Secondary | ICD-10-CM

## 2024-04-05 ENCOUNTER — Other Ambulatory Visit: Payer: Self-pay | Admitting: Internal Medicine

## 2024-04-05 DIAGNOSIS — I480 Paroxysmal atrial fibrillation: Secondary | ICD-10-CM

## 2024-05-17 ENCOUNTER — Ambulatory Visit: Admitting: Internal Medicine
